# Patient Record
Sex: Female | Born: 1980
Health system: Southern US, Community
[De-identification: ages and names within clinical notes are randomized; demographics above are authoritative.]

## PROBLEM LIST (undated history)

## (undated) ENCOUNTER — Ambulatory Visit (HOSPITAL_COMMUNITY): Admission: EM | Payer: Medicaid Other | Source: Home / Self Care

## (undated) DIAGNOSIS — F419 Anxiety disorder, unspecified: Secondary | ICD-10-CM

## (undated) DIAGNOSIS — I10 Essential (primary) hypertension: Secondary | ICD-10-CM

## (undated) DIAGNOSIS — R7303 Prediabetes: Secondary | ICD-10-CM

## (undated) DIAGNOSIS — M109 Gout, unspecified: Secondary | ICD-10-CM

## (undated) DIAGNOSIS — F329 Major depressive disorder, single episode, unspecified: Secondary | ICD-10-CM

## (undated) DIAGNOSIS — Z8739 Personal history of other diseases of the musculoskeletal system and connective tissue: Secondary | ICD-10-CM

## (undated) DIAGNOSIS — F32A Depression, unspecified: Secondary | ICD-10-CM

## (undated) DIAGNOSIS — N201 Calculus of ureter: Secondary | ICD-10-CM

## (undated) DIAGNOSIS — K219 Gastro-esophageal reflux disease without esophagitis: Secondary | ICD-10-CM

## (undated) HISTORY — PX: TUBAL LIGATION: SHX77

## (undated) HISTORY — PX: CHOLECYSTECTOMY: SHX55

---

## 2000-12-31 ENCOUNTER — Encounter: Payer: Self-pay | Admitting: Emergency Medicine

## 2000-12-31 ENCOUNTER — Inpatient Hospital Stay (HOSPITAL_COMMUNITY): Admission: EM | Admit: 2000-12-31 | Discharge: 2001-01-03 | Payer: Self-pay | Admitting: Emergency Medicine

## 2015-10-23 ENCOUNTER — Inpatient Hospital Stay (HOSPITAL_COMMUNITY)
Admission: AD | Admit: 2015-10-23 | Discharge: 2015-10-23 | Disposition: A | Discharge status: Home / Self Care | Payer: Medicaid Other | Source: Ambulatory Visit | Attending: Obstetrics and Gynecology | Admitting: Obstetrics and Gynecology

## 2015-10-23 ENCOUNTER — Encounter (HOSPITAL_COMMUNITY): Payer: Self-pay | Admitting: *Deleted

## 2015-10-23 DIAGNOSIS — Y839 Surgical procedure, unspecified as the cause of abnormal reaction of the patient, or of later complication, without mention of misadventure at the time of the procedure: Secondary | ICD-10-CM | POA: Insufficient documentation

## 2015-10-23 DIAGNOSIS — T814XXA Infection following a procedure, initial encounter: Secondary | ICD-10-CM | POA: Diagnosis not present

## 2015-10-23 DIAGNOSIS — R109 Unspecified abdominal pain: Secondary | ICD-10-CM | POA: Diagnosis present

## 2015-10-23 DIAGNOSIS — IMO0001 Reserved for inherently not codable concepts without codable children: Secondary | ICD-10-CM

## 2015-10-23 HISTORY — DX: Depression, unspecified: F32.A

## 2015-10-23 HISTORY — DX: Essential (primary) hypertension: I10

## 2015-10-23 HISTORY — DX: Major depressive disorder, single episode, unspecified: F32.9

## 2015-10-23 MED ORDER — OXYCODONE-ACETAMINOPHEN 5-325 MG PO TABS: 1.0000 | ORAL_TABLET | Freq: Three times a day (TID) | ORAL | 0 refills | Status: DC | PRN

## 2015-10-23 MED ORDER — SULFAMETHOXAZOLE-TRIMETHOPRIM 800-160 MG PO TABS: 1.0000 | ORAL_TABLET | Freq: Two times a day (BID) | ORAL | 0 refills | Status: DC

## 2015-10-23 NOTE — Discharge Instructions (Signed)
Wound Infection °A wound infection happens when a type of germ (bacteria) starts growing in the wound. In some cases, this can cause the wound to break open. If cared for properly, the infected wound will heal from the inside to the outside. Wound infections need treatment. °CAUSES °An infection is caused by bacteria growing in the wound.  °SYMPTOMS  °· Increase in redness, swelling, or pain at the wound site. °· Increase in drainage at the wound site. °· Wound or bandage (dressing) starts to smell bad. °· Fever. °· Feeling tired or fatigued. °· Pus draining from the wound. °TREATMENT  °Your health care provider will prescribe antibiotic medicine. The wound infection should improve within 24 to 48 hours. Any redness around the wound should stop spreading and the wound should be less painful.  °HOME CARE INSTRUCTIONS  °· Only take over-the-counter or prescription medicines for pain, discomfort, or fever as directed by your health care provider. °· Take your antibiotics as directed. Finish them even if you start to feel better. °· Gently wash the area with mild soap and water 2 times a day, or as directed. Rinse off the soap. Pat the area dry with a clean towel. Do not rub the wound. This may cause bleeding. °· Follow your health care provider's instructions for how often you need to change the dressing. °· Apply ointment and a dressing to the wound as directed. °· If the dressing sticks, moisten it with soapy water and gently remove it. °· Change the bandage right away if it becomes wet, dirty, or develops a bad smell. °· Take showers. Do not take tub baths, swim, or do anything that may soak the wound until it is healed. °· Avoid exercises that make you sweat heavily. °· Use anti-itch medicine as directed by your health care provider. The wound may itch when it is healing. Do not pick or scratch at the wound. °· Follow up with your health care provider to get your wound rechecked as directed. °SEEK MEDICAL CARE  IF: °· You have an increase in swelling, pain, or redness around the wound. °· You have an increase in the amount of pus coming from the wound. °· There is a bad smell coming from the wound. °· More of the wound breaks open. °· You have a fever. °MAKE SURE YOU:  °· Understand these instructions. °· Will watch your condition. °· Will get help right away if you are not doing well or get worse. °  °This information is not intended to replace advice given to you by your health care provider. Make sure you discuss any questions you have with your health care provider. °  °Document Released: 11/30/2002 Document Revised: 03/08/2013 Document Reviewed: 08/21/2014 °Elsevier Interactive Patient Education ©2016 [employer] ° °

## 2015-10-23 NOTE — MAU Provider Note (Signed)
History     CSN: [id number]  Arrival date and time: 10/23/15 1337   First Provider Initiated Contact with Patient 10/23/15 1428      No chief complaint on file.  HPI   Patient is a 35 year old G5 P2 who presents with concern for a wound infection. She had surgery on July 24. She reports that she had been doing well but over the past 48 hours she has had increasing pain in the belly button where she had her operation. She reports pain with movement and rolling and she reports that she's had some serous discharge from the area. She denies fevers chills. She denies nausea vomiting. 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 Depression   . Hypertension     Past Surgical History:  Procedure Laterality Date  . CESAREAN SECTION    . CHOLECYSTECTOMY    . TUBAL LIGATION      History reviewed. No pertinent family history.  Social History  Substance Use Topics  . Smoking status: Never Smoker  . Smokeless tobacco: Never Used  . Alcohol use Yes     Comment: occasional    Allergies: No Known Allergies  Prescriptions Prior to Admission  Medication Sig Dispense Refill Last Dose  . ibuprofen (ADVIL,MOTRIN) 800 MG tablet Take 800 mg by mouth every 8 (eight) hours as needed for mild pain.   10/23/2015 at Unknown time  . losartan (COZAAR) 100 MG tablet Take 100 mg by mouth daily.   10/23/2015 at Unknown time  . sertraline (ZOLOFT) 50 MG tablet Take 50 mg by mouth daily.   10/22/2015 at Unknown time    Review of Systems  Constitutional: Negative for chills and fever.  HENT: Negative for hearing loss.   Eyes: Negative for blurred vision and double vision.  Respiratory: Negative for cough, sputum production and shortness of breath.   Cardiovascular: Negative for chest pain and  palpitations.  Gastrointestinal: Positive for abdominal pain. Negative for heartburn, nausea and vomiting.  Genitourinary: Negative for dysuria and urgency.  Musculoskeletal: Negative for myalgias and neck pain.  Skin: Negative for itching and rash.  Neurological: Negative for dizziness, tingling and headaches.  Endo/Heme/Allergies: Negative for environmental allergies. Does not bruise/bleed easily.  Psychiatric/Behavioral: Negative for depression and substance abuse.   Physical Exam   Blood pressure 127/62, pulse 77, temperature 98 F (36.7 C), resp. rate 16.  Physical Exam  Constitutional: She appears well-developed and well-nourished.  HENT:  Head: Normocephalic and atraumatic.  Cardiovascular: Normal rate and regular rhythm.   Respiratory: Effort normal and breath sounds normal.  GI: Soft. Bowel sounds are normal.      MAU Course  Procedures  MDM In the MAU patient was evaluated and found to have a likely infected wound. She reports minimal pain except with manipulation of the area. She reports no allergies. She does ask that she establish care with our OB group as she recently moved to the area. As no purulent fluid was able to be expressed culture was not collected  Assessment and Plan  Wound infection: Patient with superficial wound infection. Given healthcare job will treat with Bactrim DS twice a day for 7  days. Patient given a emergency follow-up appointment on Monday, 10/29/2015 at 140. Patient was given 5 Percocet tablets to take in the evening to help her sleep and get comfortable until her infection begins to improve.  Melissa Cooley 10/23/2015, 2:44 PM

## 2015-10-23 NOTE — MAU Note (Signed)
Pt presents to MAU with complaints of itching and burning around her belly button where she had her tubal ligation on July the 24th. Pt had surgery in [place] and has just moved here.

## 2015-10-29 ENCOUNTER — Encounter: Payer: Self-pay | Admitting: Obstetrics & Gynecology

## 2015-10-29 ENCOUNTER — Ambulatory Visit (INDEPENDENT_AMBULATORY_CARE_PROVIDER_SITE_OTHER): Payer: Self-pay | Admitting: Obstetrics & Gynecology

## 2015-10-29 VITALS — BP 136/59 | HR 86 | Ht 71.0 in | Wt 220.1 lb

## 2015-10-29 DIAGNOSIS — Z4889 Encounter for other specified surgical aftercare: Secondary | ICD-10-CM

## 2015-10-29 NOTE — Progress Notes (Signed)
   Subjective:    Patient ID: Melissa Cooley, female    DOB: June 11, 1980, 35 y.o.   MRN: [id number]  HPI 35 yo AA lady here for an incision check. She had a BTL abot 4 weeks ago in [place]. She recently located here. She has no concerns.   Review of Systems     Objective:   Physical Exam [id number] Breathing, conversing, and ambulating normally Abd- benign She had a few small sutures at her 2 incisions that I removed. There is no evidence of any infection.       Assessment & Plan:  Incision check- doing well RTC for annual (her pap was normal reportedly last year

## 2015-11-14 ENCOUNTER — Ambulatory Visit (HOSPITAL_COMMUNITY)
Admission: EM | Admit: 2015-11-14 | Discharge: 2015-11-14 | Disposition: A | Discharge status: Home / Self Care | Payer: Medicaid Other | Attending: Emergency Medicine | Admitting: Emergency Medicine

## 2015-11-14 ENCOUNTER — Encounter (HOSPITAL_COMMUNITY): Payer: Self-pay | Admitting: Emergency Medicine

## 2015-11-14 ENCOUNTER — Telehealth: Payer: Self-pay | Admitting: *Deleted

## 2015-11-14 DIAGNOSIS — F329 Major depressive disorder, single episode, unspecified: Secondary | ICD-10-CM

## 2015-11-14 DIAGNOSIS — F32A Depression, unspecified: Secondary | ICD-10-CM

## 2015-11-14 DIAGNOSIS — I10 Essential (primary) hypertension: Secondary | ICD-10-CM

## 2015-11-14 MED ORDER — SERTRALINE HCL 50 MG PO TABS: 50.0000 mg | ORAL_TABLET | Freq: Every day | ORAL | 0 refills | Status: DC

## 2015-11-14 MED ORDER — LOSARTAN POTASSIUM 100 MG PO TABS: 100.0000 mg | ORAL_TABLET | Freq: Every day | ORAL | 0 refills | Status: DC

## 2015-11-14 NOTE — ED Triage Notes (Signed)
Patient is new [place] from [place]. Patient does not have a pcp in [place].  Patient is asking for refills of zoloft and losartan.  Patient say she has been calling around for an appt locally, but no one taking new patient's

## 2015-11-14 NOTE — Telephone Encounter (Signed)
Pt left message on nurse voice mail today @ 1012 requesting refill of Losartan and Zoloft. Per chart review, pt went [facility] @ 1230 for this reason. She was given Rx for 30 Shalea Tomczak supply of the requested medications. She will need to find PCP care as this specialty Gyn office will not prescribe these medications.

## 2015-11-15 NOTE — ED Provider Notes (Signed)
CSN: [id number]     Arrival date & time 11/14/15  1129 History   First MD Initiated Contact with Patient 11/14/15 1233     Chief Complaint  Patient presents with  . Medication Refill   (Consider location/radiation/quality/duration/timing/severity/associated sxs/prior Treatment) Patient has hx of htn and depression and has no pcp and needs refills   The history is provided by the patient.  Medication Refill  Medications/supplies requested:  Zoloft and losartan Reason for request:  Clinic/provider not available Medications taken before: yes - see home medications     Past Medical History:  Diagnosis Date  . Depression   . Hypertension    Past Surgical History:  Procedure Laterality Date  . CESAREAN SECTION    . CHOLECYSTECTOMY    . TUBAL LIGATION     Family History  Problem Relation Age of Onset  . Cancer Mother   . Diabetes Father    Social History  Substance Use Topics  . Smoking status: Never Smoker  . Smokeless tobacco: Never Used  . Alcohol use Yes     Comment: occasional   OB History    Gravida Para Term Preterm AB Living   5 2     3 2    SAB TAB Ectopic Multiple Live Births     3     2     Review of Systems  Constitutional: Negative.   HENT: Negative.   Eyes: Negative.   Respiratory: Negative.   Cardiovascular: Negative.   Gastrointestinal: Negative.   Endocrine: Negative.   Genitourinary: Negative.   Musculoskeletal: Negative.   Skin: Negative.   Allergic/Immunologic: Negative.   Neurological: Negative.   Hematological: Negative.   Psychiatric/Behavioral: Negative.     Allergies  Review of patient's allergies indicates no known allergies.  Home Medications   Prior to Admission medications   Medication Sig Start Date End Date Taking? Authorizing Provider  losartan (COZAAR) 100 MG tablet Take 100 mg by mouth daily.   Yes Historical Provider, [place]  sertraline (ZOLOFT) 50 MG tablet Take 50 mg by mouth daily.   Yes Historical Provider, [place]   ibuprofen (ADVIL,MOTRIN) 800 MG tablet Take 800 mg by mouth every 8 (eight) hours as needed for mild pain.    Historical Provider, [place]  losartan (COZAAR) 100 MG tablet Take 1 tablet (100 mg total) by mouth daily. 11/14/15   Deatra CanterWilliam J Donica Derouin, FNP  oxyCODONE-acetaminophen (ROXICET) 5-325 MG tablet Take 1 tablet by mouth every 8 (eight) hours as needed for severe pain. 10/23/15   Lorne SkeensNicholas Michael Schenk, MD  sertraline (ZOLOFT) 50 MG tablet Take 1 tablet (50 mg total) by mouth daily. 11/14/15   Deatra CanterWilliam J Damany Eastman, FNP  sulfamethoxazole-trimethoprim (BACTRIM DS,SEPTRA DS) 800-160 MG tablet Take 1 tablet by mouth 2 (two) times daily. 10/23/15   Lorne SkeensNicholas Michael Schenk, MD   Meds Ordered and Administered this Visit  Medications - No data to display  BP 127/71 (BP Location: Left Arm) Comment (BP Location): large cuff  Pulse 66   Temp 98.4 F (36.9 C) (Oral)   Resp 16   LMP 09/27/2015   SpO2 100%  No data found.   Physical Exam  Constitutional: She appears well-developed and well-nourished.  HENT:  Head: Normocephalic and atraumatic.  Right Ear: External ear normal.  Left Ear: External ear normal.  Mouth/Throat: Oropharynx is clear and moist.  Eyes: Conjunctivae and EOM are normal. Pupils are equal, round, and reactive to light.  Neck: Normal range of motion. Neck supple.  Cardiovascular: Normal rate,  regular rhythm and normal heart sounds.   Pulmonary/Chest: Effort normal and breath sounds normal.  Abdominal: Soft. Bowel sounds are normal.  Nursing note and vitals reviewed.   Urgent Care Course   Clinical Course    Procedures (including critical care time)  Labs Review Labs Reviewed - No data to display  Imaging Review No results found.   Visual Acuity Review  Right Eye Distance:   Left Eye Distance:   Bilateral Distance:    Right Eye Near:   Left Eye Near:    Bilateral Near:         MDM   1. Depression   2. Essential hypertension    zoloft 50mg  one po qd  #30 Losartan 100mg  one po qd #30    Deatra Canter, FNP 11/15/15 [phone]

## 2016-01-03 ENCOUNTER — Other Ambulatory Visit: Payer: Self-pay

## 2016-01-03 ENCOUNTER — Emergency Department (HOSPITAL_COMMUNITY)
Admission: EM | Admit: 2016-01-03 | Discharge: 2016-01-03 | Disposition: A | Discharge status: Home / Self Care | Payer: Medicaid Other | Attending: Emergency Medicine | Admitting: Emergency Medicine

## 2016-01-03 ENCOUNTER — Encounter (HOSPITAL_COMMUNITY): Payer: Self-pay | Admitting: *Deleted

## 2016-01-03 ENCOUNTER — Emergency Department (HOSPITAL_COMMUNITY): Payer: Medicaid Other

## 2016-01-03 DIAGNOSIS — I10 Essential (primary) hypertension: Secondary | ICD-10-CM | POA: Diagnosis present

## 2016-01-03 DIAGNOSIS — Z79899 Other long term (current) drug therapy: Secondary | ICD-10-CM | POA: Diagnosis not present

## 2016-01-03 LAB — BASIC METABOLIC PANEL
Anion gap: 6 (ref 5–15)
BUN: 16 mg/dL (ref 6–20)
CHLORIDE: 109 mmol/L (ref 101–111)
CO2: 22 mmol/L (ref 22–32)
Calcium: 9.3 mg/dL (ref 8.9–10.3)
Creatinine, Ser: 0.65 mg/dL (ref 0.44–1.00)
GFR calc non Af Amer: >60 mL/min (ref >60)
Glucose, Bld: 92 mg/dL (ref 65–99)
POTASSIUM: 4 mmol/L (ref 3.5–5.1)
SODIUM: 137 mmol/L (ref 135–145)

## 2016-01-03 LAB — CBC
HEMATOCRIT: 40.6 % (ref 36.0–46.0)
Hemoglobin: 13.6 g/dL (ref 12.0–15.0)
MCH: 31.7 pg (ref 26.0–34.0)
MCHC: 33.5 g/dL (ref 30.0–36.0)
MCV: 94.6 fL (ref 78.0–100.0)
Platelets: 312 10*3/uL (ref 150–400)
RBC: 4.29 MIL/uL (ref 3.87–5.11)
RDW: 13.4 % (ref 11.5–15.5)
WBC: 6.6 10*3/uL (ref 4.0–10.5)

## 2016-01-03 LAB — I-STAT TROPONIN, ED: Troponin i, poc: 0 ng/mL (ref 0.00–0.08)

## 2016-01-03 MED ORDER — LOSARTAN POTASSIUM 100 MG PO TABS: 100.0000 mg | ORAL_TABLET | Freq: Every day | ORAL | 0 refills | Status: DC

## 2016-01-03 MED ORDER — LOSARTAN POTASSIUM 50 MG PO TABS
50.0000 mg | ORAL_TABLET | ORAL | Status: AC
Start: 2016-01-03 — End: 2016-01-03
  Administered 2016-01-03: 50 mg via ORAL
  Filled 2016-01-03: qty 1

## 2016-01-03 NOTE — ED Notes (Signed)
Pt not available to sign paperwork

## 2016-01-03 NOTE — ED Provider Notes (Signed)
[facility] Provider Note   CSN: [id number] Arrival date & time: 01/03/16  1702     History   Chief Complaint Chief Complaint  Patient presents with  . Chest Pain  . Hypertension  . Headache    HPI Melissa Cooley is a 35 y.o. female.  This a 35 year old female with a history of hypertension and hyperlipidemia who states she moved to [place], 2 months ago from [place].  She into an urgent care to establish primary medical care, was given a prescription for her antihypertensive and has been unable to fill this for several months.  She again went to an urgent care yesterday who sent a prescription for Cozaar to [facility] and they would not fill it because her Medicaid card is not validated [place].  She states she had to miss work yesterday and today because her feet are swollen and this is a result of her blood pressure being slightly elevated and being without medication for approximately 2 months.  Denies any shortness of breath or chest pain      Past Medical History:  Diagnosis Date  . Depression   . Hypertension     There are no active problems to display for this patient.   Past Surgical History:  Procedure Laterality Date  . CESAREAN SECTION    . CHOLECYSTECTOMY    . TUBAL LIGATION      OB History    Gravida Para Term Preterm AB Living   5 2     3 2    SAB TAB Ectopic Multiple Live Births     3     2       Home Medications    Prior to Admission medications   Medication Sig Start Date End Date Taking? Authorizing Provider  sertraline (ZOLOFT) 50 MG tablet Take 50 mg by mouth daily.   Yes Historical Provider, [place]  losartan (COZAAR) 100 MG tablet Take 1 tablet (100 mg total) by mouth daily. 01/03/16   Earley Favor, NP  oxyCODONE-acetaminophen (ROXICET) 5-325 MG tablet Take 1 tablet by mouth every 8 (eight) hours as needed for severe pain. Patient not taking: Reported on 01/03/2016 10/23/15   Lorne Skeens, MD  sertraline (ZOLOFT)  50 MG tablet Take 1 tablet (50 mg total) by mouth daily. Patient not taking: Reported on 01/03/2016 11/14/15   Deatra Canter, FNP  sulfamethoxazole-trimethoprim (BACTRIM DS,SEPTRA DS) 800-160 MG tablet Take 1 tablet by mouth 2 (two) times daily. Patient not taking: Reported on 01/03/2016 10/23/15   Lorne Skeens, MD    Family History Family History  Problem Relation Age of Onset  . Cancer Mother   . Diabetes Father     Social History Social History  Substance Use Topics  . Smoking status: Never Smoker  . Smokeless tobacco: Never Used  . Alcohol use Yes     Comment: occasional     Allergies   Review of patient's allergies indicates no known allergies.   Review of Systems Review of Systems  Eyes: Negative for visual disturbance.  All other systems reviewed and are negative.    Physical Exam Updated Vital Signs BP 147/81 (BP Location: Right Arm)   Pulse 64   Temp 98.5 F (36.9 C) (Oral)   Resp 16   LMP 09/27/2015 Comment: tubes tied  SpO2 100%   Physical Exam  Eyes: Pupils are equal, round, and reactive to light.  Neck: Normal range of motion.  Cardiovascular: Normal rate.   Pulmonary/Chest: Effort normal.  Skin: Skin is warm and dry.  Psychiatric: Her speech is rapid and/or pressured. She is agitated and aggressive.  Nursing note and vitals reviewed.    ED Treatments / Results  Labs (all labs ordered are listed, but only abnormal results are displayed) Labs Reviewed  BASIC METABOLIC PANEL  CBC  I-STAT TROPOININ, ED    EKG  EKG Interpretation  Date/Time:  Thursday January 03 2016 17:17:35 EDT Ventricular Rate:  59 PR Interval:  164 QRS Duration: 72 QT Interval:  394 QTC Calculation: 390 R Axis:   45 Text Interpretation:  Sinus bradycardia Otherwise normal ECG No old tracing to compare Confirmed by [facility]  MD, DAVID ([id number]) on 01/03/2016 5:28:38 PM       Radiology Dg Chest 2 View  Result Date: 01/03/2016 CLINICAL DATA:  Chest  pain EXAM: CHEST  2 VIEW COMPARISON:  None. FINDINGS: The heart size and mediastinal contours are within normal limits. Both lungs are clear. The visualized skeletal structures are unremarkable. IMPRESSION: No active cardiopulmonary disease. Electronically Signed   By: Marlan Palauharles  Clark M.D.   On: 01/03/2016 18:04    Procedures Procedures (including critical care time)  Medications Ordered in ED Medications  losartan (COZAAR) tablet 50 mg (50 mg Oral Given 01/03/16 2055)     Initial Impression / Assessment and Plan / ED Course  I have reviewed the triage vital signs and the nursing notes.  Pertinent labs & imaging results that were available during my care of the patient were reviewed by me and considered in my medical decision making (see chart for details).  Clinical Course     Will give 1 dose of Cozaar in department, will give physical RX and coupon for medication with advise to establish Medicaid in [place]    Final Clinical Impressions(s) / ED Diagnoses   Final diagnoses:  Hypertension, unspecified type    New Prescriptions Current Discharge Medication List       Earley FavorGail Antwaine Boomhower, NP 01/03/16 2106    Dione Boozeavid Glick, MD 01/04/16 [phone]

## 2016-01-03 NOTE — ED Notes (Signed)
Spoke with pt. Regarding discharge NP working on papers advised pt. That papers for discharge will be available soon

## 2016-01-03 NOTE — ED Notes (Signed)
Discharge papers found on opposite nurses station went in to discharge pt., pt was gone new pt placed in room

## 2016-01-03 NOTE — ED Triage Notes (Signed)
Pt c/o high blood pressure, cp, and "swimmy" head for weeks. Pt has been off her blood pressure medication for two months. Pt move here two months ago and has not set up a PCP yet.

## 2016-01-03 NOTE — Discharge Instructions (Signed)
Take your scripture and coupon to [facility].  This will cause to 1266 for a 30 day supply.  Also recommend that you straighten out your [facility] because losartan or Cozaar is definitely covered under our prescription plan.

## 2016-02-25 ENCOUNTER — Encounter: Payer: Self-pay | Admitting: Obstetrics & Gynecology

## 2016-02-25 ENCOUNTER — Other Ambulatory Visit (HOSPITAL_COMMUNITY)
Admission: RE | Admit: 2016-02-25 | Discharge: 2016-02-25 | Disposition: A | Discharge status: Home / Self Care | Payer: Medicaid Other | Source: Ambulatory Visit | Attending: Obstetrics & Gynecology | Admitting: Obstetrics & Gynecology

## 2016-02-25 ENCOUNTER — Other Ambulatory Visit: Payer: Self-pay | Admitting: Obstetrics & Gynecology

## 2016-02-25 ENCOUNTER — Ambulatory Visit (INDEPENDENT_AMBULATORY_CARE_PROVIDER_SITE_OTHER): Payer: Medicaid Other | Admitting: Obstetrics & Gynecology

## 2016-02-25 ENCOUNTER — Ambulatory Visit (INDEPENDENT_AMBULATORY_CARE_PROVIDER_SITE_OTHER): Payer: Self-pay | Admitting: Clinical

## 2016-02-25 VITALS — BP 143/88 | HR 66 | Wt 231.3 lb

## 2016-02-25 DIAGNOSIS — Z113 Encounter for screening for infections with a predominantly sexual mode of transmission: Secondary | ICD-10-CM | POA: Diagnosis present

## 2016-02-25 DIAGNOSIS — Z01419 Encounter for gynecological examination (general) (routine) without abnormal findings: Secondary | ICD-10-CM

## 2016-02-25 DIAGNOSIS — F411 Generalized anxiety disorder: Secondary | ICD-10-CM

## 2016-02-25 DIAGNOSIS — S8992XA Unspecified injury of left lower leg, initial encounter: Secondary | ICD-10-CM

## 2016-02-25 DIAGNOSIS — Z1151 Encounter for screening for human papillomavirus (HPV): Secondary | ICD-10-CM | POA: Insufficient documentation

## 2016-02-25 DIAGNOSIS — Z1231 Encounter for screening mammogram for malignant neoplasm of breast: Secondary | ICD-10-CM

## 2016-02-25 DIAGNOSIS — Z0000 Encounter for general adult medical examination without abnormal findings: Secondary | ICD-10-CM | POA: Diagnosis not present

## 2016-02-25 LAB — CBC
HCT: 42.4 % (ref 35.0–45.0)
HEMOGLOBIN: 14 g/dL (ref 11.7–15.5)
MCH: 31.4 pg (ref 27.0–33.0)
MCHC: 33 g/dL (ref 32.0–36.0)
MCV: 95.1 fL (ref 80.0–100.0)
MPV: 9.7 fL (ref 7.5–12.5)
Platelets: 349 10*3/uL (ref 140–400)
RBC: 4.46 MIL/uL (ref 3.80–5.10)
RDW: 13.7 % (ref 11.0–15.0)
WBC: 5.9 10*3/uL (ref 3.8–10.8)

## 2016-02-25 LAB — TSH: TSH: 0.92 m[IU]/L

## 2016-02-25 MED ORDER — NORGESTREL-ETHINYL ESTRADIOL 0.3-30 MG-MCG PO TABS: 1.0000 | ORAL_TABLET | Freq: Every day | ORAL | 11 refills | Status: DC

## 2016-02-25 MED ORDER — METRONIDAZOLE 500 MG PO TABS: 500.0000 mg | ORAL_TABLET | Freq: Two times a day (BID) | ORAL | 0 refills | Status: DC

## 2016-02-25 NOTE — BH Specialist Note (Signed)
Session Start time: 9:50  End Time: 10:20Total Time:  30 minutes Type of Service: Behavioral Health - Individual/Family Interpreter: No.   Interpreter Name & Language: n/a # [facility]-June 2018[facility]   SUBJECTIVE: Melissa Cooley is a 35 y.o. female  Pt. was referred by Dr. Marice Potterove for:  anxiety. Pt. reports the following symptoms/concerns: Pt states that she is being treated for depression, but not for anxiety, although she is having panic attacks and anxiety that is affecting her daily functioning; open to learning new strategy today and establishing care with psychiatry for continued treatment of symptoms. Symptoms have increased since move to [place]; financial difficulties. 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  Pt may benefit from psychoeducation and brief therapeutic interventions regarding coping with symptoms of anxiety and depression, along with community resources.    PLAN: 1. F/U with behavioral health clinician: As needed 2. Behavioral Health meds: Zoloft 3. Behavioral recommendations:  -[facility] within one week, to  establish care with psychiatry -Practice daily CALM relaxation breathing exercise -Consider calming apps for additional self-care -Read educational material regarding coping with symptoms of anxiety (and mild depressive symptoms) -Consider [facility] as additional community resource 4. Referral: Brief Counseling/Psychotherapy, Publishing rights managerCommunity Resource and Psychoeducation 5. From scale of 1-10, how likely are you to follow plan: 9   Rae LipsJamie C Karie Skowron LCSWA Behavioral Health Clinician  Warmhandoff:   Warm Hand Off Completed.         Depression screen PHQ 2/9 02/25/2016  Decreased Interest 2  Down, Depressed, Hopeless 2  PHQ - 2 Score 4  Altered sleeping 2  Tired, decreased energy 0  Change in appetite 0  Feeling bad or failure about yourself  0  Trouble concentrating 2  Moving slowly or fidgety/restless 2  Suicidal thoughts 0  PHQ-9 Score 10   GAD 7 : Generalized Anxiety Score 02/25/2016  Nervous, Anxious, on Edge 3  Control/stop worrying 3  Worry too much - different things 3  Trouble relaxing 2  Restless 3  Easily annoyed or irritable 3  Afraid - awful might happen 3  Total GAD 7 Score 20

## 2016-02-25 NOTE — Progress Notes (Signed)
Subjective:    Melissa Cooley is a 35 y.o. S AA P2 (14 and 35 yo kids) female who presents for an annual exam. The patient has no complaints today. The patient is sexually active. GYN screening history: last pap: was normal. The patient wears seatbelts: yes. The patient participates in regular exercise: no. Has the patient ever been transfused or tattooed?: yes. The patient reports that there is not domestic violence in her life.   Menstrual History: OB History    Gravida Para Term Preterm AB Living   5 2     3 2    SAB TAB Ectopic Multiple Live Births     3     2      Menarche age: 89 No LMP recorded. Patient is not currently having periods (Reason: Irregular Periods).    The following portions of the patient's history were reviewed and updated as appropriate: allergies, current medications, past family history, past medical history, past social history, past surgical history and problem list.  Review of Systems Pertinent items are noted in HPI.   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, environmentalCNA Says irregular bleeding since BTL 12/17 [place] Objective:    BP (!) 143/88   Pulse 66   Wt 231 lb 4.8 oz (104.9 kg)   BMI 32.26 kg/m   General Appearance:    Alert, cooperative, no distress, appears stated age  Head:    Normocephalic, without obvious abnormality, atraumatic  Eyes:    PERRL, conjunctiva/corneas clear, EOM's intact, fundi    benign, both eyes  Ears:    Normal TM's and external ear canals, both ears  Nose:   Nares normal, septum midline, mucosa normal, no drainage    or sinus tenderness  Throat:   Lips, mucosa, and tongue normal; teeth and gums normal  Neck:   Supple, symmetrical, trachea midline, no adenopathy;    thyroid:  no enlargement/tenderness/nodules; no carotid   bruit or JVD  Back:     Symmetric, no curvature, ROM normal, no CVA tenderness  Lungs:     Clear to  auscultation bilaterally, respirations unlabored  Chest Wall:    No tenderness or deformity   Heart:    Regular rate and rhythm, S1 and S2 normal, no murmur, rub   or gallop  Breast Exam:    No tenderness, masses, or nipple abnormality  Abdomen:     Soft, non-tender, bowel sounds active all four quadrants,    no masses, no organomegaly  Genitalia:    Normal female without lesion, discharge or tenderness, discharge c/w BV, NSSA, NT, no palpable adnexal masses     Extremities:   Extremities normal, atraumatic, no cyanosis or edema  Pulses:   2+ and symmetric all extremities  Skin:   Skin color, texture, turgor normal, no rashes or lesions  Lymph nodes:   Cervical, supraclavicular, and axillary nodes normal  Neurologic:   CNII-XII intact, normal strength, sensation and reflexes    throughout   .    Assessment:    Healthy female exam.   BV Irregular periods FH of breast and ovarian cancer   Plan:     Mammogram. Thin prep Pap smear. STI testing  per patient request Fasting labs Flagyl  HPV cotesting Start Lo ovral for menstrual control Refer to Fam Med Flu vaccine today

## 2016-02-26 LAB — LIPID PANEL
CHOLESTEROL: 114 mg/dL (ref <200)
HDL: 41 mg/dL — ABNORMAL LOW (ref >50)
LDL Cholesterol: 56 mg/dL (ref <100)
Total CHOL/HDL Ratio: 2.8 Ratio (ref <5.0)
Triglycerides: 87 mg/dL (ref <150)
VLDL: 17 mg/dL (ref <30)

## 2016-02-26 LAB — COMPREHENSIVE METABOLIC PANEL
ALBUMIN: 4 g/dL (ref 3.6–5.1)
ALT: 14 U/L (ref 6–29)
AST: 17 U/L (ref 10–30)
Alkaline Phosphatase: 45 U/L (ref 33–115)
BILIRUBIN TOTAL: 0.6 mg/dL (ref 0.2–1.2)
BUN: 9 mg/dL (ref 7–25)
CO2: 23 mmol/L (ref 20–31)
CREATININE: 0.56 mg/dL (ref 0.50–1.10)
Calcium: 9 mg/dL (ref 8.6–10.2)
Chloride: 106 mmol/L (ref 98–110)
Glucose, Bld: 90 mg/dL (ref 65–99)
Potassium: 4.7 mmol/L (ref 3.5–5.3)
SODIUM: 138 mmol/L (ref 135–146)
TOTAL PROTEIN: 6.7 g/dL (ref 6.1–8.1)

## 2016-02-26 LAB — HEPATITIS B SURFACE ANTIGEN: Hepatitis B Surface Ag: NEGATIVE

## 2016-02-26 LAB — VITAMIN D 25 HYDROXY (VIT D DEFICIENCY, FRACTURES): Vit D, 25-Hydroxy: 24 ng/mL — ABNORMAL LOW (ref 30–100)

## 2016-02-26 LAB — HEPATITIS C ANTIBODY: HCV AB: NEGATIVE

## 2016-02-26 LAB — RPR

## 2016-02-26 LAB — HIV ANTIBODY (ROUTINE TESTING W REFLEX): HIV: NONREACTIVE

## 2016-02-27 LAB — CYTOLOGY - PAP
ADEQUACY: ABSENT
CHLAMYDIA, DNA PROBE: NEGATIVE
Diagnosis: NEGATIVE
HPV: DETECTED — AB
NEISSERIA GONORRHEA: NEGATIVE

## 2016-02-28 ENCOUNTER — Telehealth: Payer: Self-pay

## 2016-02-28 MED ORDER — VITAMIN D (ERGOCALCIFEROL) 1.25 MG (50000 UNIT) PO CAPS: 50000.0000 [IU] | ORAL_CAPSULE | ORAL | 0 refills | Status: DC

## 2016-02-28 NOTE — Telephone Encounter (Signed)
Per Dr. Marice Potterove, pt needs 8 weeks of weekly Vit D 50,000 units then recheck level.  LM for pt to return call in regards to medication management and lab appt scheduled for follow up Vit D level.

## 2016-02-29 NOTE — Telephone Encounter (Signed)
Carrera left another message she missed us calling her several times yesterday. I called her back and notified her of her vitamin D level was low and Dr. Marice Potterove ordered  Vitamin D weekly for 8 weeks and then call to get appointment to get Vitamin D level rechecked. Patient voices understanding.

## 2016-03-03 ENCOUNTER — Telehealth: Payer: Self-pay | Admitting: *Deleted

## 2016-03-03 NOTE — Telephone Encounter (Signed)
Called pt and advised her of negative Pap but with +HR HPV.  She will need Pap in 1 year with co-testing.  Pt voiced understanding.

## 2016-03-19 ENCOUNTER — Ambulatory Visit
Admission: RE | Admit: 2016-03-19 | Discharge: 2016-03-19 | Disposition: A | Discharge status: Home / Self Care | Payer: Medicaid Other | Source: Ambulatory Visit | Attending: Obstetrics & Gynecology | Admitting: Obstetrics & Gynecology

## 2016-03-19 DIAGNOSIS — Z1231 Encounter for screening mammogram for malignant neoplasm of breast: Secondary | ICD-10-CM

## 2016-04-11 ENCOUNTER — Ambulatory Visit: Payer: Self-pay | Admitting: Family Medicine

## 2016-04-24 ENCOUNTER — Ambulatory Visit (HOSPITAL_COMMUNITY)
Admission: EM | Admit: 2016-04-24 | Discharge: 2016-04-24 | Disposition: A | Discharge status: Home / Self Care | Payer: Medicaid Other | Attending: Emergency Medicine | Admitting: Emergency Medicine

## 2016-04-24 ENCOUNTER — Encounter (HOSPITAL_COMMUNITY): Payer: Self-pay | Admitting: Emergency Medicine

## 2016-04-24 DIAGNOSIS — S81812A Laceration without foreign body, left lower leg, initial encounter: Secondary | ICD-10-CM
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
## 2016-08-12 ENCOUNTER — Encounter (HOSPITAL_COMMUNITY): Payer: Self-pay

## 2016-08-12 ENCOUNTER — Emergency Department (HOSPITAL_COMMUNITY): Payer: Medicaid Other

## 2016-08-12 ENCOUNTER — Emergency Department (HOSPITAL_COMMUNITY)
Admission: EM | Admit: 2016-08-12 | Discharge: 2016-08-12 | Disposition: A | Discharge status: Home / Self Care | Payer: Medicaid Other | Attending: Emergency Medicine | Admitting: Emergency Medicine

## 2016-08-12 DIAGNOSIS — I10 Essential (primary) hypertension: Secondary | ICD-10-CM | POA: Insufficient documentation

## 2016-08-12 DIAGNOSIS — F17200 Nicotine dependence, unspecified, uncomplicated: Secondary | ICD-10-CM | POA: Diagnosis not present

## 2016-08-12 DIAGNOSIS — Z79899 Other long term (current) drug therapy: Secondary | ICD-10-CM | POA: Diagnosis not present

## 2016-08-12 DIAGNOSIS — M79641 Pain in right hand: Secondary | ICD-10-CM | POA: Diagnosis not present

## 2016-08-12 HISTORY — DX: Gout, unspecified: M10.9

## 2016-08-12 MED ORDER — IBUPROFEN 600 MG PO TABS: 600.0000 mg | ORAL_TABLET | Freq: Four times a day (QID) | ORAL | 0 refills | Status: DC | PRN

## 2016-08-12 MED ORDER — KETOROLAC TROMETHAMINE 60 MG/2ML IM SOLN
60.0000 mg | Freq: Once | INTRAMUSCULAR | Status: AC
  Administered 2016-08-12: 60 mg via INTRAMUSCULAR
  Filled 2016-08-12: qty 2

## 2016-08-12 NOTE — Discharge Instructions (Signed)
You have been seen today for hand and arm pain. There were no acute abnormalities on the x-rays, including no sign of fracture or dislocation. Pain: Take 500 mg of naproxen every 12 hours or 600 mg of ibuprofen every 6 hours for the next 3 days. Take these medications with food to avoid upset stomach. Choose only one of these medications, do not take them together.  Ice: May apply ice to the area over the next 24 hours for 15 minutes at a time to reduce swelling. Elevation: Keep the extremity elevated as often as possible to reduce pain and inflammation. Splint: Wear the wrist and hand brace for support and comfort. Wear this until pain resolves. Exercises: Start by performing these exercises a few times a week, increasing the frequency until you are performing them twice daily.  Follow up: Follow up with your primary care provider for any continued management.

## 2016-08-12 NOTE — Progress Notes (Signed)
Orthopedic Tech Progress Note Patient Details:  Melissa Cooley 14-Dec-1980 [id number]  Ortho Devices Type of Ortho Device: Thumb velcro splint Ortho Device/Splint Interventions: Application   Saul FordyceJennifer C Faolan [place] 08/12/2016, 10:34 AM

## 2016-08-12 NOTE — ED Provider Notes (Signed)
[facility]-EMERGENCY DEPT Provider Note   CSN: [id number] Arrival date & time: 08/12/16  0809     History   Chief Complaint Chief Complaint  Patient presents with  . Hand Pain    HPI Melissa Cooley is a 36 y.o. female.  HPI   Melissa Cooley is a 36 y.o. female, with a history of gout and HTN, presenting to the ED with right hand pain beginning this morning. Also endorses acute on chronic right shoulder and right elbow pain worse for the past two weeks. Pain in the hand is aching, at the base of the thumb, worse with palpation and movement. 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 She has not tried any therapies for this complaint. She has a PCP appointment on May 31. Denies neuro deficits, trauma, fever/chills, or any other complaints.      Past Medical History:  Diagnosis Date  . Depression   . Gout   . Hypertension     There are no active problems to display for this patient.   Past Surgical History:  Procedure Laterality Date  . CESAREAN SECTION    . CESAREAN SECTION    . CHOLECYSTECTOMY    . TUBAL LIGATION      OB History    Gravida Para Term Preterm AB Living   5 2     3 2    SAB TAB Ectopic Multiple Live Births     3     2       Home Medications    Prior to Admission medications   Medication Sig Start Date End Date Taking? Authorizing Provider  Cholecalciferol (VITAMIN D3) 1000 units CAPS Take 1,000 Units by mouth daily.   Yes [provider]  sertraline (ZOLOFT) 50 MG tablet Take 50 mg by mouth daily.   Yes [provider]  ibuprofen (ADVIL,MOTRIN) 600 MG tablet Take 1 tablet (600 mg total) by mouth every 6 (six) hours as needed. 08/12/16   Anthem Frazer C, PA-C  losartan (COZAAR) 100 MG tablet Take 1 tablet (100 mg total) by mouth daily. 01/03/16   Earley Favor, NP  metroNIDAZOLE (FLAGYL) 500 MG tablet Take 1 tablet (500 mg total) by mouth 2 (two) times daily. 02/25/16   Allie Bossier, MD    norgestrel-ethinyl estradiol (LO/OVRAL,CRYSELLE) 0.3-30 MG-MCG tablet Take 1 tablet by mouth daily. 02/25/16   Allie Bossier, MD  oxyCODONE-acetaminophen (ROXICET) 5-325 MG tablet Take 1 tablet by mouth every 8 (eight) hours as needed for severe pain. Patient not taking: Reported on 01/03/2016 10/23/15   Lorne Skeens, MD  sertraline (ZOLOFT) 50 MG tablet Take 1 tablet (50 mg total) by mouth daily. 11/14/15   Deatra Canter, FNP  sulfamethoxazole-trimethoprim (BACTRIM DS,SEPTRA DS) 800-160 MG tablet Take 1 tablet by mouth 2 (two) times daily. Patient not taking: Reported on 01/03/2016 10/23/15   Lorne Skeens, MD  Vitamin D, Ergocalciferol, (DRISDOL) 50000 units CAPS capsule Take 1 capsule (50,000 Units total) by mouth every 7 (seven) days. 02/28/16   Allie Bossier, MD    Family History Family History  Problem Relation Age of Onset  . Cancer Mother   . Diabetes Father     Social History Social History  Substance Use Topics  . Smoking status: Current Some Day Smoker  . Smokeless tobacco: Never Used  . Alcohol use Yes     Comment: occasional     Allergies  Patient has no known allergies.   Review of Systems Review of Systems  Constitutional: Negative for fever.  Respiratory: Negative for shortness of breath.   Cardiovascular: Negative for chest pain.  Musculoskeletal: Positive for arthralgias.  Neurological: Negative for weakness and numbness.     Physical Exam Updated Vital Signs BP (!) 143/94 (BP Location: Left Arm)   Pulse 71   Temp 98.2 F (36.8 C) (Oral)   Resp 18   Ht 5\' 11"  (1.803 m)   SpO2 100%   Physical Exam  Constitutional: She appears well-developed and well-nourished. No distress.  HENT:  Head: Normocephalic and atraumatic.  Eyes: Conjunctivae are normal.  Neck: Neck supple.  Cardiovascular: Normal rate, regular rhythm and intact distal pulses.   Pulmonary/Chest: Effort normal.  Musculoskeletal: Normal range of motion.   Patient has full range of motion in the right shoulder, elbow, and wrist without pain, tenderness, swelling, crepitus, erythema, increased warmth, or deformity. Tenderness over the right thenar eminence with some minor swelling. Range of motion of the right thumb is intact, although painful. No erythema or increased warmth noted.  Neurological: She is alert.  No sensory deficits noted in the upper extremities bilaterally. Strength 5 out of 5 in the upper extremities.  Skin: Skin is warm and dry. Capillary refill takes less than 2 seconds. She is not diaphoretic.  Psychiatric: She has a normal mood and affect. Her behavior is normal.  Nursing note and vitals reviewed.    ED Treatments / Results  Labs (all labs ordered are listed, but only abnormal results are displayed) Labs Reviewed - No data to display  EKG  EKG Interpretation None       Radiology Dg Hand Complete Right  Result Date: 08/12/2016 CLINICAL DATA:  Pain in the right first metacarpal and MCP joint began yesterday with no known injury. Episode of right elbow pain 2 weeks ago. EXAM: RIGHT HAND - COMPLETE 3+ VIEW COMPARISON:  None in PACs FINDINGS: The bones are subjectively adequately mineralized. There is no lytic or blastic lesion. There is no acute or healing fracture. The joint spaces are well maintained. The overlying soft tissues are normal. Specific attention to the first ray reveals no acute bony abnormality. IMPRESSION: No acute abnormality of the right thumb or elsewhere in the right hand is observed. Electronically Signed   By: David  [place] M.D.   On: 08/12/2016 10:48    Procedures .Splint Application Date/Time: 08/12/2016 10:17 AM Performed by: Anselm Pancoast Authorized by: Anselm Pancoast   Consent:    Consent obtained:  Verbal   Consent given by:  Patient Pre-procedure details:    Sensation:  Normal   Skin color:  Normal Procedure details:    Laterality:  Right   Location:  Hand   Hand:  R hand    Splint type:  Thumb spica   Supplies:  Prefabricated splint Post-procedure details:    Pain:  Improved   Sensation:  Normal   Skin color:  Normal   Patient tolerance of procedure:  Tolerated well, no immediate complications Comments:     Procedure was performed by the Ortho Tech with my evaluation before and after. I was available for consultation throughout the procedure.   (including critical care time)  Medications Ordered in ED Medications  ketorolac (TORADOL) injection 60 mg (60 mg Intramuscular Given 08/12/16 1021)     Initial Impression / Assessment and Plan / ED Course  I have reviewed the triage vital signs and the nursing notes.  Pertinent labs & imaging results that were available during my care of the patient were reviewed by me and considered in my medical decision making (see chart for details).       Patient presents with pain in the right hand. 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 No red flag symptoms. Doubt septic joint. Patient has close PCP follow up already scheduled. The patient was given instructions for home care as well as return precautions. Patient voices understanding of these instructions, accepts the plan, and is comfortable with discharge.     Vitals:   08/12/16 0814 08/12/16 1018 08/12/16 1020 08/12/16 1055  BP: (!) 143/94 (!) 141/76  (!) 141/96  Pulse: 71  (!) 58 62  Resp: 18   18  Temp: 98.2 F (36.8 C)     TempSrc: Oral     SpO2: 100%  100% 99%  Height: 5\' 11"  (1.803 m)        Final Clinical Impressions(s) / ED Diagnoses   Final diagnoses:  Right hand pain    New Prescriptions New Prescriptions   IBUPROFEN (ADVIL,MOTRIN) 600 MG TABLET    Take 1 tablet (600 mg total) by mouth every 6 (six) hours as needed.     Anselm PancoastJoy, Chenita Ruda C, PA-C 08/12/16 1121    Geoffery Lyonselo, Douglas, MD 08/13/16 [id number]

## 2016-08-12 NOTE — ED Triage Notes (Signed)
Per Pt, Pt reports having pain in there palm of her right hand that started yesterday. Pt has a hx of gout. Reports that the pain is in her right hand and now radiating through to her elbow.

## 2016-09-09 ENCOUNTER — Ambulatory Visit: Payer: Self-pay | Admitting: Obstetrics & Gynecology

## 2016-09-30 ENCOUNTER — Ambulatory Visit (INDEPENDENT_AMBULATORY_CARE_PROVIDER_SITE_OTHER): Payer: Self-pay | Admitting: Family Medicine

## 2016-09-30 ENCOUNTER — Other Ambulatory Visit (HOSPITAL_COMMUNITY)
Admission: RE | Admit: 2016-09-30 | Discharge: 2016-09-30 | Disposition: A | Discharge status: Home / Self Care | Payer: Medicaid Other | Source: Ambulatory Visit | Attending: Obstetrics & Gynecology | Admitting: Obstetrics & Gynecology

## 2016-09-30 ENCOUNTER — Encounter: Payer: Self-pay | Admitting: Obstetrics & Gynecology

## 2016-09-30 VITALS — BP 144/89 | HR 86 | Ht 71.0 in | Wt 238.7 lb

## 2016-09-30 DIAGNOSIS — Z113 Encounter for screening for infections with a predominantly sexual mode of transmission: Secondary | ICD-10-CM

## 2016-09-30 DIAGNOSIS — N898 Other specified noninflammatory disorders of vagina: Secondary | ICD-10-CM

## 2016-09-30 DIAGNOSIS — Z7251 High risk heterosexual behavior: Secondary | ICD-10-CM

## 2016-09-30 LAB — POCT URINALYSIS DIP (DEVICE)
Bilirubin Urine: NEGATIVE
GLUCOSE, UA: NEGATIVE mg/dL
KETONES UR: NEGATIVE mg/dL
Nitrite: NEGATIVE
PROTEIN: 30 mg/dL — AB
UROBILINOGEN UA: 1 mg/dL (ref 0.0–1.0)
pH: 6.5 (ref 5.0–8.0)

## 2016-09-30 NOTE — Progress Notes (Signed)
   Subjective:    Patient ID: Melissa Cooley, female    DOB: 10/23/1980, 36 y.o.   MRN: [id number]  HPI Seen for vaginal discharge x3 weeks, vaginal discomfort. Had unprotected sex in June and concerned about STD exposure.    Review of Systems     Objective:   Physical Exam  Constitutional: She appears well-developed and well-nourished.  Abdominal: Soft. She exhibits no distension. There is no tenderness. There is no rebound and no guarding.  Genitourinary: There is no rash, tenderness, lesion or injury on the right labia. There is no rash, tenderness, lesion or injury on the left labia. No erythema, tenderness or bleeding in the vagina. No foreign body in the vagina. No signs of injury around the vagina. Vaginal discharge (thin, white) found.      Assessment & Plan:  1. Unprotected sexual intercourse Testing as below. Discussed repeat HIV and HepB testing in 6 months. Will be due for repeat PAP at that time. - Cervicovaginal ancillary only - HIV antibody (with reflex) - RPR - Hepatitis B Surface AntiGEN

## 2016-09-30 NOTE — Addendum Note (Signed)
Addended by: Sherre LainASH, Amariah Kierstead A on: 09/30/2016 09:01 AM   Modules accepted: Orders

## 2016-10-01 LAB — CERVICOVAGINAL ANCILLARY ONLY
BACTERIAL VAGINITIS: NEGATIVE
Candida vaginitis: NEGATIVE
Chlamydia: NEGATIVE
Neisseria Gonorrhea: NEGATIVE
Trichomonas: POSITIVE — AB

## 2016-10-01 LAB — HEPATITIS B SURFACE ANTIGEN: HEP B S AG: NEGATIVE

## 2016-10-01 LAB — HIV ANTIBODY (ROUTINE TESTING W REFLEX): HIV Screen 4th Generation wRfx: NONREACTIVE

## 2016-10-01 LAB — RPR: RPR: NONREACTIVE

## 2016-10-03 LAB — URINE CULTURE

## 2016-10-07 ENCOUNTER — Telehealth: Payer: Self-pay

## 2016-10-07 DIAGNOSIS — A599 Trichomoniasis, unspecified: Secondary | ICD-10-CM

## 2016-10-07 MED ORDER — METRONIDAZOLE 500 MG PO TABS: 2000.0000 mg | ORAL_TABLET | Freq: Once | ORAL | 0 refills | Status: AC

## 2016-10-07 NOTE — Telephone Encounter (Signed)
Patient called the office for her test results. I called patient but got voicemail. Left message for patient to call office.

## 2016-10-07 NOTE — Telephone Encounter (Signed)
Patient returned call about results. I explained to patient that she was positive for trichomoniasis and that I would send medication to her pharmacy. I advised patient to have her partner treated as well. Patient had no questions and verbalized understanding. Flagyl was sent to pharmacy.

## 2016-12-24 ENCOUNTER — Encounter (HOSPITAL_COMMUNITY): Payer: Self-pay | Admitting: Emergency Medicine

## 2016-12-24 DIAGNOSIS — I10 Essential (primary) hypertension: Secondary | ICD-10-CM | POA: Insufficient documentation

## 2016-12-24 DIAGNOSIS — Z5321 Procedure and treatment not carried out due to patient leaving prior to being seen by health care provider: Secondary | ICD-10-CM | POA: Insufficient documentation

## 2016-12-24 NOTE — ED Triage Notes (Signed)
Pt states she hs hypertension, anxiety, and depression and has lost her medicaid and so she has not had any of her medication for the past 3 months  Pt states she has a headache, nausea, dizziness, and her right arm feels funny

## 2016-12-25 ENCOUNTER — Emergency Department (HOSPITAL_COMMUNITY)
Admission: EM | Admit: 2016-12-25 | Discharge: 2016-12-25 | Disposition: A | Discharge status: Home / Self Care | Payer: Self-pay | Attending: Emergency Medicine | Admitting: Emergency Medicine

## 2016-12-25 HISTORY — DX: Anxiety disorder, unspecified: F41.9

## 2016-12-25 NOTE — ED Notes (Signed)
Called  No response from lobby 

## 2016-12-25 NOTE — ED Notes (Signed)
I called patient for a room and no one answered 

## 2016-12-25 NOTE — ED Notes (Signed)
Pt called  No response from lobby  

## 2017-01-07 ENCOUNTER — Encounter: Payer: Self-pay | Admitting: Physician Assistant

## 2017-01-07 ENCOUNTER — Ambulatory Visit: Payer: Self-pay | Attending: Internal Medicine | Admitting: Physician Assistant

## 2017-01-07 VITALS — BP 130/88 | HR 76 | Temp 98.8°F | Resp 16 | Wt 240.8 lb

## 2017-01-07 DIAGNOSIS — F418 Other specified anxiety disorders: Secondary | ICD-10-CM

## 2017-01-07 DIAGNOSIS — Z79899 Other long term (current) drug therapy: Secondary | ICD-10-CM | POA: Insufficient documentation

## 2017-01-07 DIAGNOSIS — F419 Anxiety disorder, unspecified: Secondary | ICD-10-CM | POA: Insufficient documentation

## 2017-01-07 DIAGNOSIS — M109 Gout, unspecified: Secondary | ICD-10-CM | POA: Insufficient documentation

## 2017-01-07 DIAGNOSIS — I10 Essential (primary) hypertension: Secondary | ICD-10-CM | POA: Insufficient documentation

## 2017-01-07 DIAGNOSIS — Z131 Encounter for screening for diabetes mellitus: Secondary | ICD-10-CM | POA: Insufficient documentation

## 2017-01-07 DIAGNOSIS — F329 Major depressive disorder, single episode, unspecified: Secondary | ICD-10-CM | POA: Insufficient documentation

## 2017-01-07 LAB — GLUCOSE, POCT (MANUAL RESULT ENTRY): POC Glucose: 149 mg/dl — AB (ref 70–99)

## 2017-01-07 MED ORDER — FLUOXETINE HCL 20 MG PO TABS: 20.0000 mg | ORAL_TABLET | Freq: Every day | ORAL | 3 refills | Status: DC

## 2017-01-07 MED ORDER — LOSARTAN POTASSIUM 50 MG PO TABS: 50.0000 mg | ORAL_TABLET | Freq: Every day | ORAL | 3 refills | Status: DC

## 2017-01-07 MED FILL — LOSARTAN POTASSIUM 50 MG TA: 50 | 30 days supply | Qty: 30 | Fill #0

## 2017-01-07 MED FILL — ?FLUOXETINE HCL 20MG TABLET: 20 | 30 days supply | Qty: 30 | Fill #0

## 2017-01-07 NOTE — Progress Notes (Signed)
Melissa Cooley  YNW:[id number]  MVH:[id number]  DOB - [birth year]  Chief Complaint  Patient presents with  . Follow-up    ED       Subjective:   Melissa Cooley is a 36 y.o. female here today for establishment of care. She has a past medical history of depression mixed with anxiety, gout and hypertension. She moved here from [place] one year ago and has not been seen by primary care provider lately. She was last on antihypertensives and antidepressants 1 year ago. However, about 3 or 4 weeks ago she did start taking her mom's Prozac and she actually likes how it makes her feel. She is less anxious. She's not having a depressed mood. She is active during the day. Her appetite is okay. She is wanting to be seen by [facility]. Her mom is bipolar and in denial per pt.  In regards to her blood pressure she previously was on Cozaar 100 mg daily at one point. She denies chest pain. Sometimes she feels some palpitations. Sometimes she is having headaches. Intermittently she is having her blood pressure checked throughout the day and getting numbers in the 160s. She went to the emergency room a couple of weeks ago for elevated blood pressure but could not stay given the long wait. She is treating herself herbally with Tumeric, apple cider vinegar and drinking lots of water.  She also is wanting to be screened for diabetes. She states that she urinates a lot but drinks 10-12 cups of water per day.   She is wondering refills on her chronic medications.  ROS: GEN: denies fever or chills, denies change in weight Skin: denies lesions or rashes HEENT: denies headache, earache, epistaxis, sore throat, or neck pain LUNGS: denies SHOB, dyspnea, PND, orthopnea CV: denies CP or palpitations ABD: denies abd pain, N or V EXT: denies muscle spasms or swelling; no pain in lower ext, no weakness NEURO: denies numbness or tingling, denies sz, stroke or TIA  ALLERGIES: No Known Allergies  PAST MEDICAL  HISTORY: Past Medical History:  Diagnosis Date  . Anxiety   . Depression   . Gout   . Hypertension     PAST SURGICAL HISTORY: Past Surgical History:  Procedure Laterality Date  . CESAREAN SECTION    . CESAREAN SECTION    . CHOLECYSTECTOMY    . TUBAL LIGATION      MEDICATIONS AT HOME: Prior to Admission medications   Medication Sig Start Date End Date Taking? Authorizing Provider  Cholecalciferol (VITAMIN D3) 1000 units CAPS Take 1,000 Units by mouth daily.    [provider]  FLUoxetine (PROZAC) 20 MG tablet Take 1 tablet (20 mg total) by mouth daily. 01/07/17   Vivianne Master, PA-C  ibuprofen (ADVIL,MOTRIN) 600 MG tablet Take 1 tablet (600 mg total) by mouth every 6 (six) hours as needed. Patient not taking: Reported on 09/30/2016 08/12/16   Harolyn Rutherford C, PA-C  losartan (COZAAR) 50 MG tablet Take 1 tablet (50 mg total) by mouth daily. 01/07/17   Vivianne Master, PA-C  norgestrel-ethinyl estradiol (LO/OVRAL,CRYSELLE) 0.3-30 MG-MCG tablet Take 1 tablet by mouth daily. Patient not taking: Reported on 09/30/2016 02/25/16   Allie Bossier, MD  oxyCODONE-acetaminophen (ROXICET) 5-325 MG tablet Take 1 tablet by mouth every 8 (eight) hours as needed for severe pain. Patient not taking: Reported on 01/03/2016 10/23/15   Lorne Skeens, MD  sulfamethoxazole-trimethoprim (BACTRIM DS,SEPTRA DS) 800-160 MG tablet Take 1 tablet by mouth 2 (two) times daily.  Patient not taking: Reported on 01/03/2016 10/23/15   Lorne SkeensSchenk, Nicholas Michael, MD  Vitamin D, Ergocalciferol, (DRISDOL) 50000 units CAPS capsule Take 1 capsule (50,000 Units total) by mouth every 7 (seven) days. Patient not taking: Reported on 09/30/2016 02/28/16   Allie Bossierove, Myra C, MD    Family History  Problem Relation Age of Onset  . Cancer Mother   . Diabetes Father   . Hypertension Other    Social-unmarried, 2 kids, CNA  Objective:   Vitals:   01/07/17 1008  BP: 130/88  Pulse: 76  Resp: 16  Temp: 98.8 F (37.1 C)   TempSrc: Oral  SpO2: 100%  Weight: 240 lb 12.8 oz (109.2 kg)    Exam General appearance : Awake, alert, not in any distress. Speech Clear. Not toxic looking HEENT: Atraumatic and Normocephalic, pupils equally reactive to light and accomodation Neck: supple, no JVD. No cervical lymphadenopathy.  Chest:Good air entry bilaterally, no added sounds  CVS: S1 S2 regular, no murmurs.  Extremities: B/L Lower Ext shows no edema, both legs are warm to touch Neurology: Awake alert, and oriented X 3, CN II-XII intact, Non focal    Assessment & Plan  1. HTN  -restart ARB but at lower dose for now  -low salt diet  2. Depression m/w anxiety  -Prozac 20 mg daily  -Behavioral health referral  3. Screening for DM  -CBG 149 after breakfast  -encouraged exercise and low carb diet  -further testing at follow up if needed    Return in about 4 weeks (around 02/04/2017).  The patient was given clear instructions to go to ER or return to medical center if symptoms don't improve, worsen or new problems develop. The patient verbalized understanding. The patient was told to call to get lab results if they haven't heard anything in the next week.   Total time spent with patient was 31 min. Greater than 50 % of this visit was spent face to face counseling and coordinating care regarding risk factor modification, compliance importance and encouragement, education related to routine health maintenance.  This note has been created with Education officer, environmentalDragon speech recognition software and smart phrase technology. Any transcriptional errors are unintentional.    Scot Juniffany Maribeth Jiles, PA-C [facility] [place], [place] [phone]   01/07/2017, 10:38 AM

## 2017-01-07 NOTE — Patient Instructions (Signed)
You are likely not diabetic based on the screening today Will readdress at the next visit and will check thyroid function too HAPPY BDAY!

## 2017-01-07 NOTE — Progress Notes (Signed)
Pt states her pain level today in the office is a 10 Pt states she has a headache  Pt states she has not had her bp medicine or zoloft in a year

## 2017-01-19 ENCOUNTER — Ambulatory Visit: Payer: Medicaid Other | Attending: Family Medicine

## 2017-01-26 ENCOUNTER — Ambulatory Visit: Payer: Medicaid Other | Admitting: *Deleted

## 2017-01-26 DIAGNOSIS — R399 Unspecified symptoms and signs involving the genitourinary system: Secondary | ICD-10-CM

## 2017-01-26 LAB — POCT URINALYSIS DIP (DEVICE)
BILIRUBIN URINE: NEGATIVE
Glucose, UA: NEGATIVE mg/dL
LEUKOCYTES UA: NEGATIVE
NITRITE: NEGATIVE
PH: 7 (ref 5.0–8.0)
Protein, ur: NEGATIVE mg/dL
Specific Gravity, Urine: 1.025 (ref 1.005–1.030)
Urobilinogen, UA: 1 mg/dL (ref 0.0–1.0)

## 2017-01-26 MED ORDER — SULFAMETHOXAZOLE-TRIMETHOPRIM 800-160 MG PO TABS: 1.0000 | ORAL_TABLET | Freq: Two times a day (BID) | ORAL | 0 refills | Status: AC

## 2017-01-26 NOTE — Progress Notes (Signed)
Patient presents to clinic for uti symptoms. Reports strong odor, cloudiness, and urgency.

## 2017-01-28 LAB — URINE CULTURE: ORGANISM ID, BACTERIA: NO GROWTH

## 2017-01-30 MED FILL — ?FLUOXETINE HCL 20MG TABLET: 20 | 30 days supply | Qty: 30 | Fill #1

## 2017-02-04 MED FILL — LOSARTAN POTASSIUM 50 MG TA: 50 | 30 days supply | Qty: 30 | Fill #1

## 2017-02-20 ENCOUNTER — Ambulatory Visit: Payer: Self-pay | Attending: Family Medicine | Admitting: Family Medicine

## 2017-02-20 ENCOUNTER — Encounter: Payer: Self-pay | Admitting: Family Medicine

## 2017-02-20 ENCOUNTER — Ambulatory Visit: Payer: Self-pay | Attending: Family Medicine | Admitting: Licensed Clinical Social Worker

## 2017-02-20 VITALS — BP 130/84 | HR 66 | Temp 98.1°F | Ht 71.0 in | Wt 238.6 lb

## 2017-02-20 DIAGNOSIS — Z9049 Acquired absence of other specified parts of digestive tract: Secondary | ICD-10-CM | POA: Insufficient documentation

## 2017-02-20 DIAGNOSIS — Z131 Encounter for screening for diabetes mellitus: Secondary | ICD-10-CM

## 2017-02-20 DIAGNOSIS — K219 Gastro-esophageal reflux disease without esophagitis: Secondary | ICD-10-CM | POA: Insufficient documentation

## 2017-02-20 DIAGNOSIS — F314 Bipolar disorder, current episode depressed, severe, without psychotic features: Secondary | ICD-10-CM

## 2017-02-20 DIAGNOSIS — I10 Essential (primary) hypertension: Secondary | ICD-10-CM | POA: Insufficient documentation

## 2017-02-20 DIAGNOSIS — F319 Bipolar disorder, unspecified: Secondary | ICD-10-CM | POA: Insufficient documentation

## 2017-02-20 DIAGNOSIS — T148XXA Other injury of unspecified body region, initial encounter: Secondary | ICD-10-CM

## 2017-02-20 DIAGNOSIS — K580 Irritable bowel syndrome with diarrhea: Secondary | ICD-10-CM | POA: Insufficient documentation

## 2017-02-20 DIAGNOSIS — F419 Anxiety disorder, unspecified: Secondary | ICD-10-CM | POA: Insufficient documentation

## 2017-02-20 DIAGNOSIS — Z79899 Other long term (current) drug therapy: Secondary | ICD-10-CM | POA: Insufficient documentation

## 2017-02-20 DIAGNOSIS — K589 Irritable bowel syndrome without diarrhea: Secondary | ICD-10-CM | POA: Insufficient documentation

## 2017-02-20 DIAGNOSIS — Z9851 Tubal ligation status: Secondary | ICD-10-CM | POA: Insufficient documentation

## 2017-02-20 MED ORDER — PANTOPRAZOLE SODIUM 40 MG PO TBEC: 40.0000 mg | DELAYED_RELEASE_TABLET | Freq: Every day | ORAL | 3 refills | Status: DC

## 2017-02-20 MED ORDER — LOSARTAN POTASSIUM 50 MG PO TABS: 100.0000 mg | ORAL_TABLET | Freq: Every day | ORAL | 3 refills | Status: DC

## 2017-02-20 MED ORDER — LOSARTAN POTASSIUM 100 MG PO TABS: 100.0000 mg | ORAL_TABLET | Freq: Every day | ORAL | 3 refills | Status: DC

## 2017-02-20 MED ORDER — DICYCLOMINE HCL 10 MG PO CAPS: 10.0000 mg | ORAL_CAPSULE | Freq: Three times a day (TID) | ORAL | 3 refills | Status: DC

## 2017-02-20 MED ORDER — DIVALPROEX SODIUM 500 MG PO DR TAB: 500.0000 mg | DELAYED_RELEASE_TABLET | Freq: Two times a day (BID) | ORAL | 1 refills | Status: DC

## 2017-02-20 MED FILL — ?PANTOPRAZOLE SOD DR 40MG: 40 MG | 30 days supply | Qty: 30 | Fill #0

## 2017-02-20 MED FILL — LOSARTAN POTASSIUM 50 MG TA: 50 | 30 days supply | Qty: 60 | Fill #0

## 2017-02-20 MED FILL — DIVALPROEX SOD DR 500 MG TA: 500 | 30 days supply | Qty: 60 | Fill #0

## 2017-02-20 MED FILL — DICYCLOMINE 10 MG CAPSULE: 10 | 30 days supply | Qty: 120 | Fill #0

## 2017-02-20 NOTE — Patient Instructions (Signed)

## 2017-02-20 NOTE — BH Specialist Note (Signed)
Integrated Behavioral Health Initial Visit  MRN: [id number] Name: Melissa Cooley  Number of Integrated Behavioral Health Clinician visits:: 1/6 Session Start time: 2:08 PM  Session End time: 2:38 PM Total time: 30 minutes  Type of Service: Integrated Behavioral Health- Individual/Family Interpretor:No. Interpretor Name and Language: N.A   Warm Hand Off Completed.       SUBJECTIVE: Melissa Cooley is a 36 y.o. female accompanied by self Patient was referred by Dr. Venetia NightAmao for depression and anxiety. Patient reports the following symptoms/concerns: feelings of sadness and worry, difficulty sleeping, low energy, racing thoughts, decreased concentration, irritability, and substance use Duration of problem: Ongoing; Severity of problem: severe  OBJECTIVE: Mood: Anxious and Affect: Appropriate Risk of harm to self or others: No plan to harm self or others  LIFE CONTEXT: Family and Social: Pt has two minor children that she raises on her own. She receives emotional support from family who resides nearby in [place], [place] School/Work: Pt is employed Self-Care: Pt drinks alcohol and smokes marijuana daily to cope with stressors Life Changes: Pt's grandmother passed away (Jan 2018), brother was sentenced to 20+ years in prison, mother's health is decreasing, and pt's teenage daughter has mental health concerns.   GOALS ADDRESSED: Patient will: 1. Reduce symptoms of: anxiety and depression 2. Increase knowledge and/or ability of: coping skills  3. Demonstrate ability to: Increase adequate support systems for patient/family, Decrease self-medicating behaviors and Begin healthy grieving over loss  INTERVENTIONS: Interventions utilized: Solution-Focused Strategies, Supportive Counseling, Psychoeducation and/or Health Education and Link to [facility]  Standardized Assessments completed: GAD-7 and PHQ 2&9  ASSESSMENT: Patient currently experiencing depression and anxiety  triggered by grief and ongoing substance use. Pt reports feelings of sadness and worry, difficulty sleeping, low energy, racing thoughts, decreased concentration, irritability. Denies SI/HI/AVH. She receives emotional support from family.   Patient may benefit from psychotherapy. She is participating in medication management through PCP. LCSWA educated pt on stages of grief and discussed how substance use can negatively impact one's mental and physical health. Pt successfully identified healthier coping skills, in addition, to complying with medication management. She is open to long-term counseling to address hx of trauma. LCSWA [facility] to assist with grief support for minor children. Resources for crisis intervention was provided.  PLAN: 1. Follow up with behavioral health clinician on : Pt was encouraged to contact LCSWA if symptoms worsen or fail to improve to [facility]. 2. Behavioral recommendations: LCSWA recommends that pt apply healthy coping skills discussed, comply with medication management, and utilize provided resources. Pt is encouraged to schedule follow up appointment with LCSWA 3. Referral(s): Community Mental Health Services (LME/Outside Clinic) 4. "From scale of 1-10, how likely are you to follow plan?": 10/10  Bridgett LarssonJasmine D Lewis, LCSW 02/25/17 4:49 PM

## 2017-02-20 NOTE — Progress Notes (Signed)
Subjective:  Patient ID: Melissa Cooley, female    DOB: 09-Sep-1980  Age: 36 y.o. MRN: [id number]  CC: Establish Care   HPI Melissa Cooley is a 36 year old female with a history of Hypertension who presents to establish care.  She was seen by the PA at her last visit and commenced on Losartan however she informs me her blood pressures are still elevated at home as she was previously on Losartan 170m but got restarted on 529m  She was also placed on Prozac for anxiety and depression which she states is not working. Her mind keeps racing and will not shut off and at other times she has low moods. She does have a positive family history of Bipolar disorder in her mom.  She complains of dyspepsia but denies abdominal pain, hematemesis or hematochezia. She does have diarrhea which she has had for the last 2 years with passage of watery stools sometimes up to 3 times/day.  She is also concerned about bruising on her arms and thighs with no preceding trauma which have been intermittent since the summer.    Past Medical History:  Diagnosis Date  . Anxiety   . Depression   . Gout   . Hypertension     Past Surgical History:  Procedure Laterality Date  . CESAREAN SECTION    . CESAREAN SECTION    . CHOLECYSTECTOMY    . TUBAL LIGATION      No Known Allergies   Outpatient Medications Prior to Visit  Medication Sig Dispense Refill  . FLUoxetine (PROZAC) 20 MG tablet Take 1 tablet (20 mg total) by mouth daily. 30 tablet 3  . losartan (COZAAR) 50 MG tablet Take 1 tablet (50 mg total) by mouth daily. 30 tablet 3  . Cholecalciferol (VITAMIN D3) 1000 units CAPS Take 1,000 Units by mouth daily.    . Marland Kitchenbuprofen (ADVIL,MOTRIN) 600 MG tablet Take 1 tablet (600 mg total) by mouth every 6 (six) hours as needed. (Patient not taking: Reported on 09/30/2016) 30 tablet 0  . norgestrel-ethinyl estradiol (LO/OVRAL,CRYSELLE) 0.3-30 MG-MCG tablet Take 1 tablet by mouth daily. (Patient not taking: Reported on  09/30/2016) 1 Package 11  . Vitamin D, Ergocalciferol, (DRISDOL) 50000 units CAPS capsule Take 1 capsule (50,000 Units total) by mouth every 7 (seven) days. (Patient not taking: Reported on 09/30/2016) 8 capsule 0  . oxyCODONE-acetaminophen (ROXICET) 5-325 MG tablet Take 1 tablet by mouth every 8 (eight) hours as needed for severe pain. (Patient not taking: Reported on 01/03/2016) 5 tablet 0   No facility-administered medications prior to visit.     ROS Review of Systems  Constitutional: Negative for activity change, appetite change and fatigue.  HENT: Negative for congestion, sinus pressure and sore throat.   Eyes: Negative for visual disturbance.  Respiratory: Negative for cough, chest tightness, shortness of breath and wheezing.   Cardiovascular: Negative for chest pain and palpitations.  Gastrointestinal: Negative for abdominal distention, abdominal pain and constipation.  Endocrine: Negative for polydipsia.  Genitourinary: Negative for dysuria and frequency.  Musculoskeletal: Negative for arthralgias and back pain.  Skin: Negative for rash.  Neurological: Negative for tremors, light-headedness and numbness.  Hematological: Bruises/bleeds easily.  Psychiatric/Behavioral: Negative for agitation and behavioral problems.    Objective:  BP 130/84   Pulse 66   Temp 98.1 F (36.7 C) (Oral)   Ht '5\' 11"'  (1.803 m)   Wt 238 lb 9.6 oz (108.2 kg)   LMP 02/02/2017   SpO2 100%   BMI 33.28 kg/m  BP/Weight 02/20/2017 01/07/2017 59/74/1638  Systolic BP 453 646 803  Diastolic BP 84 88 98  Wt. (Lbs) 238.6 240.8 -  BMI 33.28 33.58 -      Physical Exam  Constitutional: She is oriented to person, place, and time. She appears well-developed and well-nourished.  Cardiovascular: Normal rate, normal heart sounds and intact distal pulses.  No murmur heard. Pulmonary/Chest: Effort normal and breath sounds normal. She has no wheezes. She has no rales. She exhibits no tenderness.  Abdominal:  Soft. Bowel sounds are normal. She exhibits no distension and no mass. There is no tenderness.  Musculoskeletal: Normal range of motion.  Neurological: She is alert and oriented to person, place, and time.  Skin: Skin is warm and dry.  Few bruises on forearms  Psychiatric: She has a normal mood and affect. Her speech is rapid and/or pressured.    Depression screen [facility] 2/9 02/20/2017 01/07/2017 02/25/2016  Decreased Interest '1 1 2  ' Down, Depressed, Hopeless '1 2 2  ' PHQ - 2 Score '2 3 4  ' Altered sleeping '1 2 2  ' Tired, decreased energy 1 2 0  Change in appetite 0 2 0  Feeling bad or failure about yourself  1 2 0  Trouble concentrating '1 2 2  ' Moving slowly or fidgety/restless '3 3 2  ' Suicidal thoughts 0 0 0  PHQ-9 Score '9 16 10    ' GAD 7 : Generalized Anxiety Score 02/20/2017 01/07/2017 02/25/2016  Nervous, Anxious, on Edge '3 3 3  ' Control/stop worrying '3 3 3  ' Worry too much - different things '3 3 3  ' Trouble relaxing '3 3 2  ' Restless '3 3 3  ' Easily annoyed or irritable '3 3 3  ' Afraid - awful might happen '3 3 3  ' Total GAD 7 Score '21 21 20      ' Assessment & Plan:   1. Essential hypertension Increase losartan dose from 50 mg to 100 mg due to elevated blood pressures at home Low sodium, DASH diet - CMP14+EGFR - CBC with Differential/Platelet - hCG, serum, qualitative - Basic Metabolic Panel; Future - losartan (COZAAR) 100 MG tablet; Take 1 tablet (100 mg total) by mouth daily.  Dispense: 30 tablet; Refill: 3  2. Bipolar disorder without psychotic features ([place]) Placed on Depakote Discontinue Prozac LCSW called in for therapy She will need to see behavioral health for ongoing management-advised to apply for the [facility] financial discount to facilitate this. Discussed the need to remain on contraception-she is status post BTL - Ambulatory referral to Psychiatry  3. Irritable bowel syndrome with diarrhea Commence Bentyl - dicyclomine (BENTYL) 10 MG capsule; Take 1 capsule (10 mg  total) by mouth 4 (four) times daily -  before meals and at bedtime.  Dispense: 120 capsule; Refill: 3  4. Gastroesophageal reflux disease without esophagitis Avoid late meals We will place on PPI - pantoprazole (PROTONIX) 40 MG tablet; Take 1 tablet (40 mg total) by mouth daily.  Dispense: 30 tablet; Refill: 3  5. Bruising Unknown etiology - PT AND PTT   Meds ordered this encounter  Medications  . DISCONTD: losartan (COZAAR) 50 MG tablet    Sig: Take 2 tablets (100 mg total) by mouth daily.    Dispense:  30 tablet    Refill:  3    Discontinue previous dose  . dicyclomine (BENTYL) 10 MG capsule    Sig: Take 1 capsule (10 mg total) by mouth 4 (four) times daily -  before meals and at bedtime.    Dispense:  120  capsule    Refill:  3  . pantoprazole (PROTONIX) 40 MG tablet    Sig: Take 1 tablet (40 mg total) by mouth daily.    Dispense:  30 tablet    Refill:  3  . divalproex (DEPAKOTE) 500 MG DR tablet    Sig: Take 1 tablet (500 mg total) by mouth 2 (two) times daily.    Dispense:  60 tablet    Refill:  1  . losartan (COZAAR) 100 MG tablet    Sig: Take 1 tablet (100 mg total) by mouth daily.    Dispense:  30 tablet    Refill:  3    Discontinue 23m    Follow-up: Return in about 6 weeks (around 04/03/2017) for Follow-up of hypertension and bipolar.   EArnoldo MoraleMD

## 2017-02-21 LAB — CMP14+EGFR
ALBUMIN: 4.3 g/dL (ref 3.5–5.5)
ALK PHOS: 55 IU/L (ref 39–117)
ALT: 20 IU/L (ref 0–32)
AST: 23 IU/L (ref 0–40)
Albumin/Globulin Ratio: 1.4 (ref 1.2–2.2)
BILIRUBIN TOTAL: 0.6 mg/dL (ref 0.0–1.2)
BUN / CREAT RATIO: 12 (ref 9–23)
BUN: 8 mg/dL (ref 6–20)
CHLORIDE: 104 mmol/L (ref 96–106)
CO2: 19 mmol/L — ABNORMAL LOW (ref 20–29)
Calcium: 9 mg/dL (ref 8.7–10.2)
Creatinine, Ser: 0.67 mg/dL (ref 0.57–1.00)
GFR calc Af Amer: 131 mL/min/{1.73_m2} (ref >59)
GFR calc non Af Amer: 113 mL/min/{1.73_m2} (ref >59)
GLUCOSE: 82 mg/dL (ref 65–99)
Globulin, Total: 3.1 g/dL (ref 1.5–4.5)
Potassium: 4 mmol/L (ref 3.5–5.2)
SODIUM: 136 mmol/L (ref 134–144)
Total Protein: 7.4 g/dL (ref 6.0–8.5)

## 2017-02-21 LAB — PT AND PTT
INR: 1.1 (ref 0.8–1.2)
Prothrombin Time: 10.9 s (ref 9.1–12.0)
aPTT: 28 s (ref 24–33)

## 2017-02-21 LAB — CBC WITH DIFFERENTIAL/PLATELET
BASOS ABS: 0 10*3/uL (ref 0.0–0.2)
Basos: 0 %
EOS (ABSOLUTE): 0.1 10*3/uL (ref 0.0–0.4)
Eos: 1 %
Hematocrit: 40.9 % (ref 34.0–46.6)
Hemoglobin: 13.7 g/dL (ref 11.1–15.9)
Immature Grans (Abs): 0 10*3/uL (ref 0.0–0.1)
Immature Granulocytes: 0 %
LYMPHS ABS: 3.6 10*3/uL — AB (ref 0.7–3.1)
Lymphs: 52 %
MCH: 31.9 pg (ref 26.6–33.0)
MCHC: 33.5 g/dL (ref 31.5–35.7)
MCV: 95 fL (ref 79–97)
MONOCYTES: 7 %
MONOS ABS: 0.5 10*3/uL (ref 0.1–0.9)
Neutrophils Absolute: 2.8 10*3/uL (ref 1.4–7.0)
Neutrophils: 40 %
PLATELETS: 339 10*3/uL (ref 150–379)
RBC: 4.3 x10E6/uL (ref 3.77–5.28)
RDW: 14.1 % (ref 12.3–15.4)
WBC: 6.9 10*3/uL (ref 3.4–10.8)

## 2017-02-21 LAB — HCG, SERUM, QUALITATIVE: HCG, BETA SUBUNIT, QUAL, SERUM: NEGATIVE m[IU]/mL (ref <6)

## 2017-02-24 ENCOUNTER — Encounter: Payer: Self-pay | Admitting: Family Medicine

## 2017-02-24 LAB — SPECIMEN STATUS REPORT

## 2017-02-24 LAB — HEMOGLOBIN A1C
Est. average glucose Bld gHb Est-mCnc: 111 mg/dL
Hgb A1c MFr Bld: 5.5 % (ref 4.8–5.6)

## 2017-02-26 ENCOUNTER — Telehealth: Payer: Self-pay

## 2017-02-26 NOTE — Telephone Encounter (Signed)
Pt was called and informed of normal lab results. 

## 2017-04-01 ENCOUNTER — Other Ambulatory Visit: Payer: Self-pay | Admitting: Family Medicine

## 2017-04-01 DIAGNOSIS — F319 Bipolar disorder, unspecified: Secondary | ICD-10-CM

## 2017-04-01 MED FILL — LOSARTAN POTASSIUM 50 MG TA: 50 | 30 days supply | Qty: 60 | Fill #1

## 2017-04-03 ENCOUNTER — Ambulatory Visit: Payer: Self-pay | Attending: Family Medicine | Admitting: Family Medicine

## 2017-04-03 ENCOUNTER — Encounter: Payer: Self-pay | Admitting: Family Medicine

## 2017-04-03 VITALS — BP 132/84 | HR 68 | Temp 97.9°F | Ht 71.0 in | Wt 247.0 lb

## 2017-04-03 DIAGNOSIS — Z9889 Other specified postprocedural states: Secondary | ICD-10-CM | POA: Insufficient documentation

## 2017-04-03 DIAGNOSIS — Z6841 Body Mass Index (BMI) 40.0 and over, adult: Secondary | ICD-10-CM

## 2017-04-03 DIAGNOSIS — K219 Gastro-esophageal reflux disease without esophagitis: Secondary | ICD-10-CM

## 2017-04-03 DIAGNOSIS — Z79899 Other long term (current) drug therapy: Secondary | ICD-10-CM | POA: Insufficient documentation

## 2017-04-03 DIAGNOSIS — F319 Bipolar disorder, unspecified: Secondary | ICD-10-CM

## 2017-04-03 DIAGNOSIS — R197 Diarrhea, unspecified: Secondary | ICD-10-CM | POA: Insufficient documentation

## 2017-04-03 DIAGNOSIS — K009 Disorder of tooth development, unspecified: Secondary | ICD-10-CM

## 2017-04-03 DIAGNOSIS — Z9851 Tubal ligation status: Secondary | ICD-10-CM | POA: Insufficient documentation

## 2017-04-03 DIAGNOSIS — E669 Obesity, unspecified: Secondary | ICD-10-CM | POA: Insufficient documentation

## 2017-04-03 DIAGNOSIS — E66813 Obesity, class 3: Secondary | ICD-10-CM

## 2017-04-03 DIAGNOSIS — K580 Irritable bowel syndrome with diarrhea: Secondary | ICD-10-CM

## 2017-04-03 DIAGNOSIS — Z6834 Body mass index (BMI) 34.0-34.9, adult: Secondary | ICD-10-CM | POA: Insufficient documentation

## 2017-04-03 DIAGNOSIS — I10 Essential (primary) hypertension: Secondary | ICD-10-CM

## 2017-04-03 DIAGNOSIS — Z9049 Acquired absence of other specified parts of digestive tract: Secondary | ICD-10-CM | POA: Insufficient documentation

## 2017-04-03 NOTE — Progress Notes (Signed)
Subjective:  Patient ID: Melissa Cooley, female    DOB: 03-07-1981  Age: 37 y.o. MRN: [id number]  CC: Hypertension   HPI Melissa Cooley is a 37 year old female with a history of hypertension, GERD, IBS, bipolar disorder who presents today for follow-up visit.  At her last office visit the dose of Losartan had been increased due to suboptimal blood pressure control and she reports improvement in her blood pressures which have been controlled lately. She was also commenced on Bentyl due to symptoms consistent with IBS and was also placed on Protonix due to gastroesophageal reflux symptoms and she reports doing well on these medications. Denies nausea, vomiting, constipation or diarrhea.  I had commenced her on Depakote and referred to behavioral health for management of bipolar disorder and she reports improvement in her symptoms with resolution of her racing thoughts however she has gained 9 pounds in the last 5 weeks and has noticed intermenstrual spotting. She is unhappy with these adverse effects and has since discontinued it; she has an upcoming appointment with [facility] later this month.  She is requesting referral to a dentist for dental pain as she has not seen one in a while.  Past Medical History:  Diagnosis Date  . Anxiety   . Depression   . Gout   . Hypertension     Past Surgical History:  Procedure Laterality Date  . CESAREAN SECTION    . CESAREAN SECTION    . CHOLECYSTECTOMY    . TUBAL LIGATION      No Known Allergies   Outpatient Medications Prior to Visit  Medication Sig Dispense Refill  . Cholecalciferol (VITAMIN D3) 1000 units CAPS Take 1,000 Units by mouth daily.    . divalproex (DEPAKOTE) 500 MG DR tablet Take 1 tablet (500 mg total) by mouth 2 (two) times daily. 60 tablet 1  . losartan (COZAAR) 100 MG tablet Take 1 tablet (100 mg total) by mouth daily. 30 tablet 3  . pantoprazole (PROTONIX) 40 MG tablet Take 1 tablet (40 mg total) by mouth daily. 30 tablet 3    . dicyclomine (BENTYL) 10 MG capsule Take 1 capsule (10 mg total) by mouth 4 (four) times daily -  before meals and at bedtime. (Patient not taking: Reported on 04/03/2017) 120 capsule 3  . ibuprofen (ADVIL,MOTRIN) 600 MG tablet Take 1 tablet (600 mg total) by mouth every 6 (six) hours as needed. (Patient not taking: Reported on 09/30/2016) 30 tablet 0  . norgestrel-ethinyl estradiol (LO/OVRAL,CRYSELLE) 0.3-30 MG-MCG tablet Take 1 tablet by mouth daily. (Patient not taking: Reported on 09/30/2016) 1 Package 11  . Vitamin D, Ergocalciferol, (DRISDOL) 50000 units CAPS capsule Take 1 capsule (50,000 Units total) by mouth every 7 (seven) days. (Patient not taking: Reported on 09/30/2016) 8 capsule 0   No facility-administered medications prior to visit.     ROS Review of Systems  Constitutional: Positive for appetite change. Negative for activity change and fatigue.  HENT: Positive for dental problem. Negative for congestion, sinus pressure and sore throat.   Eyes: Negative for visual disturbance.  Respiratory: Negative for cough, chest tightness, shortness of breath and wheezing.   Cardiovascular: Negative for chest pain and palpitations.  Gastrointestinal: Negative for abdominal distention, abdominal pain and constipation.  Endocrine: Negative for polydipsia.  Genitourinary: Negative for dysuria and frequency.  Musculoskeletal: Negative for arthralgias and back pain.  Skin: Negative for rash.  Neurological: Negative for tremors, light-headedness and numbness.  Hematological: Does not bruise/bleed easily.  Psychiatric/Behavioral: Negative for agitation and  behavioral problems.    Objective:  BP 132/84   Pulse 68   Temp 97.9 F (36.6 C) (Oral)   Ht 5\' 11"  (1.803 m)   Wt 247 lb (112 kg)   SpO2 99%   BMI 34.45 kg/m   BP/Weight 04/03/2017 02/20/2017 01/07/2017  Systolic BP 132 130 130  Diastolic BP 84 84 88  Wt. (Lbs) 247 238.6 240.8  BMI 34.45 33.28 33.58      Physical Exam   Constitutional: She is oriented to person, place, and time. She appears well-developed and well-nourished.  Cardiovascular: Normal rate, normal heart sounds and intact distal pulses.  No murmur heard. Pulmonary/Chest: Effort normal and breath sounds normal. She has no wheezes. She has no rales. She exhibits no tenderness.  Abdominal: Soft. Bowel sounds are normal. She exhibits no distension and no mass. There is no tenderness.  Musculoskeletal: Normal range of motion.  Neurological: She is alert and oriented to person, place, and time.  Skin: Skin is warm and dry.  Psychiatric: She has a normal mood and affect.     Assessment & Plan:   1. Essential hypertension Controlled on losartan Counseled on blood pressure goal of less than 130/80, low-sodium, DASH diet, medication compliance, 150 minutes of moderate intensity exercise per week. Discussed medication compliance, adverse effects.   2. Irritable bowel syndrome with diarrhea Doing well on Bentyl  3. Gastroesophageal reflux disease without esophagitis Controlled on pantoprazole Avoid late meals  4. Bipolar disease, chronic (HCC) Symptoms improved on Depakote however will discontinue this due to weight gain of 9 pounds in the last 6 weeks She has an upcoming appointment with [facility] in 04/10/17 and has been advised to keep this  5. Class 3 severe obesity due to excess calories without serious comorbidity with body mass index (BMI) of 40.0 to 44.9 in adult [facility](HCC) Discussed reducing portion sizes, avoiding late meals, increasing physical activity  6. Dental anomaly - Ambulatory referral to Dentistry   No orders of the defined types were placed in this encounter.   Follow-up: Return in about 3 months (around 07/02/2017) for follow up of chronic medical conditions.   Jaclyn ShaggyEnobong Amao MD

## 2017-04-03 NOTE — Patient Instructions (Signed)

## 2017-04-10 ENCOUNTER — Encounter (HOSPITAL_COMMUNITY): Payer: Self-pay | Admitting: Psychiatry

## 2017-04-10 ENCOUNTER — Ambulatory Visit (INDEPENDENT_AMBULATORY_CARE_PROVIDER_SITE_OTHER): Payer: No Typology Code available for payment source | Admitting: Psychiatry

## 2017-04-10 ENCOUNTER — Telehealth (HOSPITAL_COMMUNITY): Payer: Self-pay | Admitting: Psychiatry

## 2017-04-10 VITALS — BP 126/74 | HR 61 | Ht 71.0 in | Wt 240.4 lb

## 2017-04-10 DIAGNOSIS — Z87891 Personal history of nicotine dependence: Secondary | ICD-10-CM

## 2017-04-10 DIAGNOSIS — Z818 Family history of other mental and behavioral disorders: Secondary | ICD-10-CM

## 2017-04-10 DIAGNOSIS — F1010 Alcohol abuse, uncomplicated: Secondary | ICD-10-CM

## 2017-04-10 DIAGNOSIS — Z62810 Personal history of physical and sexual abuse in childhood: Secondary | ICD-10-CM

## 2017-04-10 DIAGNOSIS — Z62811 Personal history of psychological abuse in childhood: Secondary | ICD-10-CM

## 2017-04-10 DIAGNOSIS — F411 Generalized anxiety disorder: Secondary | ICD-10-CM

## 2017-04-10 DIAGNOSIS — Z813 Family history of other psychoactive substance abuse and dependence: Secondary | ICD-10-CM

## 2017-04-10 DIAGNOSIS — Z79899 Other long term (current) drug therapy: Secondary | ICD-10-CM

## 2017-04-10 DIAGNOSIS — F319 Bipolar disorder, unspecified: Secondary | ICD-10-CM

## 2017-04-10 DIAGNOSIS — F1210 Cannabis abuse, uncomplicated: Secondary | ICD-10-CM

## 2017-04-10 MED ORDER — QUETIAPINE FUMARATE 100 MG PO TABS: 100.0000 mg | ORAL_TABLET | Freq: Every day | ORAL | 0 refills | Status: DC

## 2017-04-10 MED ORDER — HYDROXYZINE HCL 10 MG PO TABS: ORAL_TABLET | ORAL | 0 refills | Status: DC

## 2017-04-10 MED FILL — hydrOXYzine HCL 10 MG TABS: 10 | 30 days supply | Qty: 60 | Fill #0

## 2017-04-10 MED FILL — QUETIAPINE FUMARATE 100 MG: 100 | 30 days supply | Qty: 30 | Fill #0
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
## 2017-04-30 ENCOUNTER — Telehealth: Payer: Self-pay | Admitting: Family Medicine

## 2017-04-30 DIAGNOSIS — I10 Essential (primary) hypertension: Secondary | ICD-10-CM

## 2017-04-30 DIAGNOSIS — K219 Gastro-esophageal reflux disease without esophagitis: Secondary | ICD-10-CM

## 2017-04-30 MED ORDER — LOSARTAN POTASSIUM 100 MG PO TABS: 100.0000 mg | ORAL_TABLET | Freq: Every day | ORAL | 3 refills | Status: DC

## 2017-04-30 MED ORDER — PANTOPRAZOLE SODIUM 40 MG PO TBEC: 40.0000 mg | DELAYED_RELEASE_TABLET | Freq: Every day | ORAL | 3 refills | Status: DC

## 2017-04-30 MED FILL — LOSARTAN POTASSIUM 50 MG TA: 50 | 30 days supply | Qty: 30 | Fill #2

## 2017-04-30 NOTE — Telephone Encounter (Signed)
Refilled

## 2017-04-30 NOTE — Telephone Encounter (Signed)
Patient called requesting a refill on her losartan please fu with patient she only has 4 pills left. And she also needs a refill on protonix.

## 2017-05-04 MED FILL — ?PANTOPRAZOLE SOD DR 40MG: 40 MG | 30 days supply | Qty: 30 | Fill #1

## 2017-05-11 ENCOUNTER — Ambulatory Visit: Payer: Self-pay | Attending: Family Medicine | Admitting: Family Medicine

## 2017-05-11 ENCOUNTER — Encounter: Payer: Self-pay | Admitting: Family Medicine

## 2017-05-11 VITALS — BP 121/78 | HR 70 | Temp 98.0°F | Ht 71.0 in | Wt 242.4 lb

## 2017-05-11 DIAGNOSIS — Z9851 Tubal ligation status: Secondary | ICD-10-CM | POA: Insufficient documentation

## 2017-05-11 DIAGNOSIS — M109 Gout, unspecified: Secondary | ICD-10-CM | POA: Insufficient documentation

## 2017-05-11 DIAGNOSIS — N61 Mastitis without abscess: Secondary | ICD-10-CM | POA: Insufficient documentation

## 2017-05-11 DIAGNOSIS — F419 Anxiety disorder, unspecified: Secondary | ICD-10-CM | POA: Insufficient documentation

## 2017-05-11 DIAGNOSIS — I10 Essential (primary) hypertension: Secondary | ICD-10-CM | POA: Insufficient documentation

## 2017-05-11 DIAGNOSIS — Z803 Family history of malignant neoplasm of breast: Secondary | ICD-10-CM | POA: Insufficient documentation

## 2017-05-11 DIAGNOSIS — N644 Mastodynia: Secondary | ICD-10-CM | POA: Insufficient documentation

## 2017-05-11 DIAGNOSIS — Z79899 Other long term (current) drug therapy: Secondary | ICD-10-CM | POA: Insufficient documentation

## 2017-05-11 LAB — POCT URINE PREGNANCY: Preg Test, Ur: NEGATIVE

## 2017-05-11 MED ORDER — CEPHALEXIN 500 MG PO CAPS: 500.0000 mg | ORAL_CAPSULE | Freq: Two times a day (BID) | ORAL | 0 refills | Status: DC

## 2017-05-11 MED FILL — CEPHALEXIN 500 MG CAPSULE: 500 | 10 days supply | Qty: 20 | Fill #0

## 2017-05-11 NOTE — Patient Instructions (Signed)
Mastitis °Mastitis is redness, soreness, and puffiness (inflammation) in an area of the breast. It is often caused by an infection that occurs when bacteria enter the skin. The infection is often helped by antibiotic medicine. °Follow these instructions at home: °· Only take medicines as told by your doctor. °· If your doctor prescribed an antibiotic medicine, take it as told. Finish it even if you start to feel better. °· Do not wear a tight or underwire bra. Wear a soft support bra. °· Drink more fluids, especially if you have a fever. °· If you are breastfeeding: °? Keep emptying the breast. Your doctor can tell you if the milk is safe. Use a breast pump if you are told to stop nursing. °? Keep your nipples clean and dry. °? Empty the first breast before going to the other breast. Use a breast pump if your baby is not emptying your breast. °? If you go back to work, pump your breasts while at work. °? Avoid letting your breasts get overly filled with milk (engorged). °Contact a doctor if: °· You have pus-like fluid leaking from your breast. °· Your symptoms do not get better within 2 days. °Get help right away if: °· Your pain and puffiness are getting worse. °· Your pain is not helped by medicine. °· You have a red line going from your breast toward your armpit. °· You have a fever or lasting symptoms for more than 2-3 days. °· You have a fever and your symptoms suddenly get worse. °This information is not intended to replace advice given to you by your health care provider. Make sure you discuss any questions you have with your health care provider. °Document Released: 02/19/2009 Document Revised: 08/09/2015 Document Reviewed: 10/01/2012 °Elsevier Interactive Patient Education © 2017 [employer] ° °

## 2017-05-11 NOTE — Progress Notes (Signed)
Subjective:  Patient ID: Melissa Cooley, female    DOB: Feb 21, 1981  Age: 37 y.o. MRN: [id number]  CC: Breast Pain   HPI Melissa Cooley  is a 37 year old female with a history of hypertension, GERD, IBS, bipolar disorder who presents today with left nipple pain for the last 1 week which she describes as nauseating and radiating upward into her breast but denies nipple discharge. A couple of years back she had similar symptoms which occurred in both breast and was associated with bilateral nipple discharge. She is not nursing and denies a history of fevers or palpation of breast lumps. She has a positive history of breast cancer in her mom was diagnosed at the age of 44. LMP was 04/27/17 and she is status post bilateral tubal ligation. Last mammogram from 03/2016 was negative for malignancy.  Past Medical History:  Diagnosis Date  . Anxiety   . Depression   . Gout   . Hypertension     Past Surgical History:  Procedure Laterality Date  . CESAREAN SECTION    . CESAREAN SECTION    . CHOLECYSTECTOMY    . TUBAL LIGATION      No Known Allergies   Outpatient Medications Prior to Visit  Medication Sig Dispense Refill  . hydrOXYzine (ATARAX/VISTARIL) 10 MG tablet Take 1-2 tab daily as needed for anziety 60 tablet 0  . ibuprofen (ADVIL,MOTRIN) 600 MG tablet Take 1 tablet (600 mg total) by mouth every 6 (six) hours as needed. 30 tablet 0  . losartan (COZAAR) 100 MG tablet Take 1 tablet (100 mg total) by mouth daily. 30 tablet 3  . pantoprazole (PROTONIX) 40 MG tablet Take 1 tablet (40 mg total) by mouth daily. 30 tablet 3  . Vitamin D, Ergocalciferol, (DRISDOL) 50000 units CAPS capsule Take 1 capsule (50,000 Units total) by mouth every 7 (seven) days. 8 capsule 0  . QUEtiapine (SEROQUEL) 100 MG tablet Take 1 tablet (100 mg total) by mouth at bedtime. (Patient not taking: Reported on 05/11/2017) 30 tablet 0   No facility-administered medications prior to visit.     ROS Review of Systems    Constitutional: Negative for activity change, appetite change and fatigue.  HENT: Negative for congestion, sinus pressure and sore throat.   Eyes: Negative for visual disturbance.  Respiratory: Negative for cough, chest tightness, shortness of breath and wheezing.   Cardiovascular: Negative for chest pain and palpitations.  Gastrointestinal: Negative for abdominal distention, abdominal pain and constipation.  Endocrine: Negative for polydipsia.  Genitourinary: Negative for dysuria and frequency.  Musculoskeletal: Negative for arthralgias and back pain.  Skin: Negative for rash.  Neurological: Negative for tremors, light-headedness and numbness.  Hematological: Does not bruise/bleed easily.  Psychiatric/Behavioral: Negative for agitation and behavioral problems.    Objective:  BP 121/78   Pulse 70   Temp 98 F (36.7 C) (Oral)   Ht 5\' 11"  (1.803 m)   Wt 242 lb 6.4 oz (110 kg)   SpO2 99%   BMI 33.81 kg/m   BP/Weight 05/11/2017 04/10/2017 04/03/2017  Systolic BP 121 126 132  Diastolic BP 78 74 84  Wt. (Lbs) 242.4 240.4 247  BMI 33.81 33.53 34.45      Physical Exam  Pulmonary/Chest: Right breast exhibits no inverted nipple, no nipple discharge and no tenderness. Left breast exhibits tenderness (TTP of left nipple, no skin changes, no warmth). Left breast exhibits no inverted nipple and no nipple discharge.     Assessment & Plan:   1. Mastitis, left, acute  Advised to apply cool compress as tolerated - cephALEXin (KEFLEX) 500 MG capsule; Take 1 capsule (500 mg total) by mouth 2 (two) times daily.  Dispense: 20 capsule; Refill: 0  2. Family history of breast cancer in mother Will order diagnostic mammogram and left breast ultrasound at next visit given she is at high risk   Meds ordered this encounter  Medications  . cephALEXin (KEFLEX) 500 MG capsule    Sig: Take 1 capsule (500 mg total) by mouth 2 (two) times daily.    Dispense:  20 capsule    Refill:  0     Follow-up: Return in about 2 weeks (around 05/25/2017) for Follow-up of mastitis.   Hoy RegisterEnobong Vidhi Delellis MD

## 2017-05-18 ENCOUNTER — Other Ambulatory Visit: Payer: Self-pay

## 2017-05-18 DIAGNOSIS — I10 Essential (primary) hypertension: Secondary | ICD-10-CM

## 2017-05-18 MED ORDER — LOSARTAN POTASSIUM 100 MG PO TABS: 100.0000 mg | ORAL_TABLET | Freq: Every day | ORAL | 3 refills | Status: DC

## 2017-05-18 MED FILL — LOSARTAN POTASSIUM 100 MG T: 100 | 30 days supply | Qty: 30 | Fill #0

## 2017-05-20 ENCOUNTER — Ambulatory Visit (HOSPITAL_COMMUNITY): Payer: Self-pay | Admitting: Psychiatry

## 2017-05-25 ENCOUNTER — Ambulatory Visit: Payer: Self-pay | Admitting: Family Medicine

## 2017-06-17 MED FILL — LOSARTAN POTASSIUM 100 MG T: 100 | 30 days supply | Qty: 30 | Fill #1

## 2017-06-19 MED FILL — ?PANTOPRAZOLE SO DR 40MG TA: 40 | 30 days supply | Qty: 30 | Fill #2

## 2017-06-30 ENCOUNTER — Encounter: Payer: Self-pay | Admitting: Family Medicine

## 2017-06-30 ENCOUNTER — Ambulatory Visit: Payer: Self-pay | Attending: Family Medicine | Admitting: Family Medicine

## 2017-06-30 VITALS — BP 124/86 | HR 69 | Temp 98.1°F | Ht 71.0 in | Wt 250.2 lb

## 2017-06-30 DIAGNOSIS — Z9851 Tubal ligation status: Secondary | ICD-10-CM | POA: Insufficient documentation

## 2017-06-30 DIAGNOSIS — Z791 Long term (current) use of non-steroidal anti-inflammatories (NSAID): Secondary | ICD-10-CM | POA: Insufficient documentation

## 2017-06-30 DIAGNOSIS — I10 Essential (primary) hypertension: Secondary | ICD-10-CM | POA: Insufficient documentation

## 2017-06-30 DIAGNOSIS — R635 Abnormal weight gain: Secondary | ICD-10-CM

## 2017-06-30 DIAGNOSIS — K589 Irritable bowel syndrome without diarrhea: Secondary | ICD-10-CM | POA: Insufficient documentation

## 2017-06-30 DIAGNOSIS — Z9049 Acquired absence of other specified parts of digestive tract: Secondary | ICD-10-CM | POA: Insufficient documentation

## 2017-06-30 DIAGNOSIS — F411 Generalized anxiety disorder: Secondary | ICD-10-CM

## 2017-06-30 DIAGNOSIS — F319 Bipolar disorder, unspecified: Secondary | ICD-10-CM | POA: Insufficient documentation

## 2017-06-30 DIAGNOSIS — Z803 Family history of malignant neoplasm of breast: Secondary | ICD-10-CM | POA: Insufficient documentation

## 2017-06-30 DIAGNOSIS — N644 Mastodynia: Secondary | ICD-10-CM | POA: Insufficient documentation

## 2017-06-30 DIAGNOSIS — Z79899 Other long term (current) drug therapy: Secondary | ICD-10-CM | POA: Insufficient documentation

## 2017-06-30 DIAGNOSIS — Z9889 Other specified postprocedural states: Secondary | ICD-10-CM | POA: Insufficient documentation

## 2017-06-30 DIAGNOSIS — M109 Gout, unspecified: Secondary | ICD-10-CM | POA: Insufficient documentation

## 2017-06-30 DIAGNOSIS — K219 Gastro-esophageal reflux disease without esophagitis: Secondary | ICD-10-CM | POA: Insufficient documentation

## 2017-06-30 MED ORDER — HYDROXYZINE HCL 10 MG PO TABS: ORAL_TABLET | ORAL | 0 refills | Status: DC

## 2017-06-30 NOTE — Patient Instructions (Signed)

## 2017-06-30 NOTE — Progress Notes (Signed)
Subjective:  Patient ID: Melissa Cooley, female    DOB: 1980/03/29  Age: 37 y.o. MRN: [id number]  CC: Hypertension   HPI Melissa Cooley is a 37 year old female with a history of hypertension, GERD, IBS, bipolar disorder who completed a course of Keflex which she received for mastitis at her last office visit  7 weeks ago and reports near complete resolution of symptoms .A couple of years back she had similar symptoms which occurred in both breast and was associated with bilateral nipple discharge. She is not nursing and denies a history of fevers or palpation of breast lumps. She has a positive history of breast cancer in her mom was diagnosed at the age of 37. Last mammogram from 03/2016 was negative for malignancy. She is concerned and would still like to have a mammogram.  She scored 20 on her GAD 7 screen and is followed by psychiatry, Dr Lolly MustacheArfeen who manages her bipolar disorder however she discontinued Seroquel due to excessive sedation and has been out of hydroxyzine. Her last visit to psychiatry was in 03/2017 Reports hydroxyzine helped her symptoms. Doing well on her her antihypertensive  Complains of feeling "weird" and has gained weight lately which concerns her (gained 8 pounds in the last 6 weeks); she would like her thyroid checked.  She also notices heat intolerance at night. She does not exercise regularly and has not  paid any particular attention to modifying her diet.  Past Medical History:  Diagnosis Date  . Anxiety   . Depression   . Gout   . Hypertension     Past Surgical History:  Procedure Laterality Date  . CESAREAN SECTION    . CESAREAN SECTION    . CHOLECYSTECTOMY    . TUBAL LIGATION      No Known Allergies   Outpatient Medications Prior to Visit  Medication Sig Dispense Refill  . hydrOXYzine (ATARAX/VISTARIL) 10 MG tablet Take 1-2 tab daily as needed for anziety 60 tablet 0  . losartan (COZAAR) 100 MG tablet Take 1 tablet (100 mg total) by mouth daily.  30 tablet 3  . pantoprazole (PROTONIX) 40 MG tablet Take 1 tablet (40 mg total) by mouth daily. 30 tablet 3  . Vitamin D, Ergocalciferol, (DRISDOL) 50000 units CAPS capsule Take 1 capsule (50,000 Units total) by mouth every 7 (seven) days. 8 capsule 0  . cephALEXin (KEFLEX) 500 MG capsule Take 1 capsule (500 mg total) by mouth 2 (two) times daily. (Patient not taking: Reported on 06/30/2017) 20 capsule 0  . ibuprofen (ADVIL,MOTRIN) 600 MG tablet Take 1 tablet (600 mg total) by mouth every 6 (six) hours as needed. (Patient not taking: Reported on 06/30/2017) 30 tablet 0  . QUEtiapine (SEROQUEL) 100 MG tablet Take 1 tablet (100 mg total) by mouth at bedtime. (Patient not taking: Reported on 05/11/2017) 30 tablet 0   No facility-administered medications prior to visit.     ROS Review of Systems  Constitutional: Negative for activity change, appetite change and fatigue.  HENT: Negative for congestion, sinus pressure and sore throat.   Eyes: Negative for visual disturbance.  Respiratory: Negative for cough, chest tightness, shortness of breath and wheezing.   Cardiovascular: Negative for chest pain and palpitations.  Gastrointestinal: Negative for abdominal distention, abdominal pain and constipation.  Endocrine: Negative for polydipsia.  Genitourinary: Negative for dysuria and frequency.  Musculoskeletal: Negative for arthralgias and back pain.  Skin: Negative for rash.  Neurological: Negative for tremors, light-headedness and numbness.  Hematological: Does not bruise/bleed easily.  Psychiatric/Behavioral: Negative for agitation and behavioral problems.    Objective:  BP 124/86   Pulse 69   Temp 98.1 F (36.7 C) (Oral)   Ht 5\' 11"  (1.803 m)   Wt 250 lb 3.2 oz (113.5 kg)   SpO2 100%   BMI 34.90 kg/m   BP/Weight 06/30/2017 05/11/2017 04/03/2017  Systolic BP 124 121 132  Diastolic BP 86 78 84  Wt. (Lbs) 250.2 242.4 247  BMI 34.9 33.81 34.45  Some encounter information is confidential  and restricted. Go to Review Flowsheets activity to see all data.      Physical Exam  Constitutional: She is oriented to person, place, and time. She appears well-developed and well-nourished.  Cardiovascular: Normal rate, normal heart sounds and intact distal pulses.  No murmur heard. Pulmonary/Chest: Effort normal and breath sounds normal. She has no wheezes. She has no rales. She exhibits no tenderness.  Abdominal: Soft. Bowel sounds are normal. She exhibits no distension and no mass. There is no tenderness.  Musculoskeletal: Normal range of motion.  Neurological: She is alert and oriented to person, place, and time.  Skin: Skin is warm and dry.  Psychiatric: She has a normal mood and affect.     Assessment & Plan:  Generalized anxiety disorder/bipolar disorder Refill hydroxyzine Advised to reschedule appointment with psychiatrist  Breast pain Completed treatment for mastitis Family history of breast cancer in mom at an early age Referred for diagnostic mammogram  Weight gain We will send of labs-TSH, LH/FSH Reduce portion sizes Increase physical activity to 150 minutes of moderate intensity exercise per week  Hypertension Controlled  There are no diagnoses linked to this encounter.  No orders of the defined types were placed in this encounter.   Follow-up: No follow-ups on file.   Hoy Register MD

## 2017-07-01 LAB — FSH/LH
FSH: 5.4 m[IU]/mL
LH: 6.5 m[IU]/mL

## 2017-07-01 LAB — TSH: TSH: 0.74 u[IU]/mL (ref 0.450–4.500)

## 2017-07-02 ENCOUNTER — Telehealth: Payer: Self-pay

## 2017-07-02 NOTE — Telephone Encounter (Signed)
Patient was called and informed of lab results. 

## 2017-07-03 ENCOUNTER — Ambulatory Visit: Payer: Self-pay | Admitting: Family Medicine

## 2017-07-16 MED FILL — hydrOXYzine HCL 10 MG TABS: 10 | 30 days supply | Qty: 60 | Fill #0

## 2017-07-16 MED FILL — LOSARTAN POTASSIUM 100 MG T: 100 | 30 days supply | Qty: 30 | Fill #2

## 2017-07-29 ENCOUNTER — Ambulatory Visit: Payer: Self-pay | Attending: Family Medicine | Admitting: Physician Assistant

## 2017-07-29 VITALS — BP 122/80 | HR 70 | Temp 98.4°F | Resp 16 | Ht 71.0 in | Wt 244.6 lb

## 2017-07-29 DIAGNOSIS — F419 Anxiety disorder, unspecified: Secondary | ICD-10-CM | POA: Insufficient documentation

## 2017-07-29 DIAGNOSIS — F329 Major depressive disorder, single episode, unspecified: Secondary | ICD-10-CM | POA: Insufficient documentation

## 2017-07-29 DIAGNOSIS — R350 Frequency of micturition: Secondary | ICD-10-CM | POA: Insufficient documentation

## 2017-07-29 DIAGNOSIS — I10 Essential (primary) hypertension: Secondary | ICD-10-CM | POA: Insufficient documentation

## 2017-07-29 DIAGNOSIS — Z202 Contact with and (suspected) exposure to infections with a predominantly sexual mode of transmission: Secondary | ICD-10-CM | POA: Insufficient documentation

## 2017-07-29 DIAGNOSIS — M109 Gout, unspecified: Secondary | ICD-10-CM | POA: Insufficient documentation

## 2017-07-29 DIAGNOSIS — L298 Other pruritus: Secondary | ICD-10-CM | POA: Insufficient documentation

## 2017-07-29 DIAGNOSIS — Z79899 Other long term (current) drug therapy: Secondary | ICD-10-CM | POA: Insufficient documentation

## 2017-07-29 DIAGNOSIS — N898 Other specified noninflammatory disorders of vagina: Secondary | ICD-10-CM

## 2017-07-29 LAB — POCT URINALYSIS DIPSTICK
BILIRUBIN UA: NEGATIVE
Blood, UA: NEGATIVE
GLUCOSE UA: NEGATIVE
Ketones, UA: NEGATIVE
Leukocytes, UA: NEGATIVE
Nitrite, UA: NEGATIVE
PH UA: 6.5 (ref 5.0–8.0)
Spec Grav, UA: 1.02 (ref 1.010–1.025)
Urobilinogen, UA: 0.2 E.U./dL

## 2017-07-29 NOTE — Progress Notes (Signed)
Pt. Stated she think her PH is off, a lot of anal itching, abdomen pressure.  Pt. Stated "something is in her urethra."

## 2017-07-29 NOTE — Progress Notes (Signed)
Patient ID: Melissa Cooley, female   DOB: Mar 22, 1980, 37 y.o.   MRN: [id number]     Melissa Cooley, is a 37 y.o. female  [id number]  [id number]  DOB - 21-Apr-1980  Subjective:  Chief Complaint and HPI: Melissa Cooley is a 37 y.o. female here today for urinary/vaginal s/sx and wants STI screening.  No sex for 7 months.  Now in Long distance relationship.  At [facility] 07/10/2017.  Had lots of sex while on vacation with him.  Started having pelvic pressure and vaginal odor about 1 week after returning.  No other partners.  No f/c.  No N/V.  Occasional urinary frequency and on and off dysuria.  No abdominal pain.  Mild vaginal d/c.  Has appt for pap 09/03/2017.  Some dyspareunia.       ROS:   Constitutional:  No f/c, No night sweats, No unexplained weight loss. EENT:  No vision changes, No blurry vision, No hearing changes. No mouth, throat, or ear problems.  Respiratory: No cough, No SOB Cardiac: No CP, no palpitations GI:  No abd pain, No N/V/D. GU: + Urinary s/sx, some vaginal discharge.  No pelvic pain Musculoskeletal: No joint pain Neuro: No headache, no dizziness, no motor weakness.  Skin: No rash Endocrine:  No polydipsia. No polyuria.  Psych: Denies SI/HI  No problems updated.  ALLERGIES: No Known Allergies  PAST MEDICAL HISTORY: Past Medical History:  Diagnosis Date  . Anxiety   . Depression   . Gout   . Hypertension     MEDICATIONS AT HOME: Prior to Admission medications   Medication Sig Start Date End Date Taking? Authorizing Provider  hydrOXYzine (ATARAX/VISTARIL) 10 MG tablet Take 1-2 tab daily as needed for anziety 06/30/17  Yes Newlin, Enobong, MD  losartan (COZAAR) 100 MG tablet Take 1 tablet (100 mg total) by mouth daily. 05/18/17  Yes Hoy Register, MD  pantoprazole (PROTONIX) 40 MG tablet Take 1 tablet (40 mg total) by mouth daily. 04/30/17  Yes Hoy Register, MD  cephALEXin (KEFLEX) 500 MG capsule Take 1 capsule (500 mg total) by mouth 2 (two) times  daily. Patient not taking: Reported on 06/30/2017 05/11/17   Hoy Register, MD  ibuprofen (ADVIL,MOTRIN) 600 MG tablet Take 1 tablet (600 mg total) by mouth every 6 (six) hours as needed. Patient not taking: Reported on 06/30/2017 08/12/16   Harolyn Rutherford C, PA-C  QUEtiapine (SEROQUEL) 100 MG tablet Take 1 tablet (100 mg total) by mouth at bedtime. Patient not taking: Reported on 05/11/2017 04/10/17 04/10/18  Cleotis Nipper, MD  Vitamin D, Ergocalciferol, (DRISDOL) 50000 units CAPS capsule Take 1 capsule (50,000 Units total) by mouth every 7 (seven) days. Patient not taking: Reported on 07/29/2017 02/28/16   Allie Bossier, MD     Objective:  EXAM:   Vitals:   07/29/17 0914  BP: 122/80  Pulse: 70  Resp: 16  Temp: 98.4 F (36.9 C)  TempSrc: Oral  SpO2: 100%  Weight: 244 lb 9.6 oz (110.9 kg)  Height:  (1.803 m)    General appearance : A&OX3. NAD. Non-toxic-appearing HEENT: Atraumatic and Normocephalic.  PERRLA. EOM intact.   Neck: supple, no JVD. No cervical lymphadenopathy. No thyromegaly Chest/Lungs:  Breathing-non-labored, Good air entry bilaterally, breath sounds normal without rales, rhonchi, or wheezing  CVS: S1 S2 regular, no murmurs, gallops, rubs  Extremities: Bilateral Lower Ext shows no edema, both legs are warm to touch with = pulse throughout Neurology:  CN II-XII grossly intact, Non focal.   Psych:  TP linear. J/I WNL. Normal speech. Appropriate eye contact and affect.  Skin:  No Rash  Data Review Lab Results  Component Value Date   HGBA1C 5.5 02/20/2017     Assessment & Plan   1. STD exposure - Urinalysis Dipstick - Urine cytology ancillary only - HIV antibody (with reflex) - RPR Will treat according to any findings.  2. Vaginal itching UA dip WNL.   - Urine cytology ancillary only - HIV antibody (with reflex) - RPR  3. Urinary frequency Increase water, urinate after sex, sexual lubricants such as astroglide, etc.   - Urine cytology ancillary  only - HIV antibody (with reflex) - RPR  Patient have been counseled extensively about nutrition and exercise  Return for keep 7/16 appt with Dr Alvis Lemmings.  The patient was given clear instructions to go to ER or return to medical center if symptoms don't improve, worsen or new problems develop. The patient verbalized understanding. The patient was told to call to get lab results if they haven't heard anything in the next week.     Georgian Co, PA-C [facility] [place], [place] [phone]   07/29/2017, 9:39 AM

## 2017-07-30 LAB — URINE CYTOLOGY ANCILLARY ONLY
Chlamydia: NEGATIVE
Neisseria Gonorrhea: NEGATIVE
Trichomonas: NEGATIVE

## 2017-07-30 LAB — HIV ANTIBODY (ROUTINE TESTING W REFLEX): HIV SCREEN 4TH GENERATION: NONREACTIVE

## 2017-07-30 LAB — RPR: RPR Ser Ql: NONREACTIVE

## 2017-08-01 LAB — URINE CYTOLOGY ANCILLARY ONLY: Candida vaginitis: NEGATIVE

## 2017-08-03 ENCOUNTER — Telehealth: Payer: Self-pay

## 2017-08-03 ENCOUNTER — Other Ambulatory Visit: Payer: Self-pay | Admitting: Physician Assistant

## 2017-08-03 MED ORDER — METRONIDAZOLE 500 MG PO TABS: 500.0000 mg | ORAL_TABLET | Freq: Two times a day (BID) | ORAL | 0 refills | Status: DC

## 2017-08-03 MED FILL — metroNIDAZOLE 500 MG TABS: 500 | 7 days supply | Qty: 14 | Fill #0

## 2017-08-03 NOTE — Telephone Encounter (Signed)
CMA called patient to inform on lab results and Rx.  Patient understood.  

## 2017-08-03 NOTE — Telephone Encounter (Signed)
-----   Message from Anders Simmonds, [place] sent at 08/03/2017  7:03 AM EDT ----- Please call patient.  Her STD testing was ll negative, but she did test positive for bacterial vaginosis which is NOT an STD.  I sent her a prescription to the pharmacy for this. Thanks,  Georgian Co, PA-C

## 2017-08-06 ENCOUNTER — Ambulatory Visit: Payer: Self-pay | Attending: Family Medicine | Admitting: Physician Assistant

## 2017-08-06 VITALS — BP 130/84 | HR 76 | Temp 98.7°F | Resp 16 | Ht 71.0 in | Wt 244.6 lb

## 2017-08-06 DIAGNOSIS — R062 Wheezing: Secondary | ICD-10-CM | POA: Insufficient documentation

## 2017-08-06 DIAGNOSIS — R0602 Shortness of breath: Secondary | ICD-10-CM | POA: Insufficient documentation

## 2017-08-06 DIAGNOSIS — J40 Bronchitis, not specified as acute or chronic: Secondary | ICD-10-CM | POA: Insufficient documentation

## 2017-08-06 DIAGNOSIS — I10 Essential (primary) hypertension: Secondary | ICD-10-CM | POA: Insufficient documentation

## 2017-08-06 DIAGNOSIS — M109 Gout, unspecified: Secondary | ICD-10-CM | POA: Insufficient documentation

## 2017-08-06 DIAGNOSIS — Z79899 Other long term (current) drug therapy: Secondary | ICD-10-CM | POA: Insufficient documentation

## 2017-08-06 DIAGNOSIS — F419 Anxiety disorder, unspecified: Secondary | ICD-10-CM | POA: Insufficient documentation

## 2017-08-06 DIAGNOSIS — R05 Cough: Secondary | ICD-10-CM | POA: Insufficient documentation

## 2017-08-06 DIAGNOSIS — F329 Major depressive disorder, single episode, unspecified: Secondary | ICD-10-CM | POA: Insufficient documentation

## 2017-08-06 MED ORDER — METHYLPREDNISOLONE SODIUM SUCC 125 MG IJ SOLR
125.0000 mg | Freq: Once | INTRAMUSCULAR | Status: AC
  Administered 2017-08-06: 125 mg via INTRAMUSCULAR

## 2017-08-06 MED ORDER — FLUCONAZOLE 150 MG PO TABS: 150.0000 mg | ORAL_TABLET | Freq: Once | ORAL | 0 refills | Status: AC

## 2017-08-06 MED ORDER — AZITHROMYCIN 250 MG PO TABS: ORAL_TABLET | ORAL | 0 refills | Status: DC

## 2017-08-06 MED ORDER — IPRATROPIUM-ALBUTEROL 0.5-2.5 (3) MG/3ML IN SOLN
3.0000 mL | Freq: Once | RESPIRATORY_TRACT | Status: AC
  Administered 2017-08-06: 3 mL via RESPIRATORY_TRACT

## 2017-08-06 MED ORDER — ALBUTEROL SULFATE HFA 108 (90 BASE) MCG/ACT IN AERS: 2.0000 | INHALATION_SPRAY | Freq: Four times a day (QID) | RESPIRATORY_TRACT | 0 refills | Status: DC | PRN

## 2017-08-06 NOTE — Progress Notes (Signed)
Pt. Is here for a cough and has it for a week.

## 2017-08-06 NOTE — Progress Notes (Signed)
Patient ID: Melissa Cooley, female   DOB: October 04, 1980, 37 y.o.   MRN: [id number]   Melissa Cooley, is a 37 y.o. female  [id number]  [id number]  DOB - [birth year]  Subjective:  Chief Complaint and HPI: Melissa Cooley is a 37 y.o. female here today for 8 day h/o cough, congestion, ST.  First 2 days she had fever.  + wheezing and SOB but no h/o asthma.  Poor sleep.  Her sister is getting married Saturday.    ROS:   Constitutional:  No f/c, No night sweats, No unexplained weight loss. EENT:  No vision changes, No blurry vision, No hearing changes. No mouth, throat, or ear problems.  Respiratory: + cough, + SOB Cardiac: No CP, no palpitations GI:  No abd pain, No N/V/D. GU: No Urinary s/sx Musculoskeletal: No joint pain Neuro: No headache, no dizziness, no motor weakness.  Skin: No rash Endocrine:  No polydipsia. No polyuria.  Psych: Denies SI/HI  No problems updated.  ALLERGIES: No Known Allergies  PAST MEDICAL HISTORY: Past Medical History:  Diagnosis Date  . Anxiety   . Depression   . Gout   . Hypertension     MEDICATIONS AT HOME: Prior to Admission medications   Medication Sig Start Date End Date Taking? Authorizing Provider  hydrOXYzine (ATARAX/VISTARIL) 10 MG tablet Take 1-2 tab daily as needed for anziety 06/30/17  Yes Newlin, Enobong, MD  losartan (COZAAR) 100 MG tablet Take 1 tablet (100 mg total) by mouth daily. 05/18/17  Yes Hoy Register, MD  metroNIDAZOLE (FLAGYL) 500 MG tablet Take 1 tablet (500 mg total) by mouth 2 (two) times daily. 08/03/17  Yes Makaia Rappa M, PA-C  pantoprazole (PROTONIX) 40 MG tablet Take 1 tablet (40 mg total) by mouth daily. 04/30/17  Yes Hoy Register, MD  albuterol (PROVENTIL HFA;VENTOLIN HFA) 108 (90 Base) MCG/ACT inhaler Inhale 2 puffs into the lungs every 6 (six) hours as needed for wheezing or shortness of breath. 08/06/17   Anders Simmonds, PA-C  azithromycin (ZITHROMAX) 250 MG tablet Take 2 today then 1 daily 08/06/17   Anders Simmonds, PA-C  cephALEXin (KEFLEX) 500 MG capsule Take 1 capsule (500 mg total) by mouth 2 (two) times daily. Patient not taking: Reported on 06/30/2017 05/11/17   Hoy Register, MD  fluconazole (DIFLUCAN) 150 MG tablet Take 1 tablet (150 mg total) by mouth once for 1 dose. If needed for yeast infection 08/06/17 08/06/17  Anders Simmonds, PA-C  ibuprofen (ADVIL,MOTRIN) 600 MG tablet Take 1 tablet (600 mg total) by mouth every 6 (six) hours as needed. Patient not taking: Reported on 06/30/2017 08/12/16   Harolyn Rutherford C, PA-C  QUEtiapine (SEROQUEL) 100 MG tablet Take 1 tablet (100 mg total) by mouth at bedtime. Patient not taking: Reported on 05/11/2017 04/10/17 04/10/18  Cleotis Nipper, MD  Vitamin D, Ergocalciferol, (DRISDOL) 50000 units CAPS capsule Take 1 capsule (50,000 Units total) by mouth every 7 (seven) days. Patient not taking: Reported on 07/29/2017 02/28/16   Allie Bossier, MD     Objective:  EXAM:   Vitals:   08/06/17 1636  BP: 130/84  Pulse: 76  Resp: 16  Temp: 98.7 F (37.1 C)  TempSrc: Oral  SpO2: 95%  Weight: 244 lb 9.6 oz (110.9 kg)  Height:  (1.803 m)    General appearance : A&OX3. NAD. Non-toxic-appearing HEENT: Atraumatic and Normocephalic.  PERRLA. EOM intact.  TM clear B. Mouth-MMM, post pharynx WNL w/o erythema, No PND. Neck: supple, no JVD. No  cervical lymphadenopathy. No thyromegaly Chest/Lungs:  Breathing-non-labored, fair air entry bilaterally, breath sounds normal without rales or rhonchi.  There is mild to moderate wheezing throughout CVS: S1 S2 regular, no murmurs, gallops, rubs  Extremities: Bilateral Lower Ext shows no edema, both legs are warm to touch with = pulse throughout Neurology:  CN II-XII grossly intact, Non focal.   Psych:  TP linear. J/I WNL. Normal speech. Appropriate eye contact and affect.  Skin:  No Rash  Data Review Lab Results  Component Value Date   HGBA1C 5.5 02/20/2017     Assessment & Plan   1. Bronchitis -  azithromycin (ZITHROMAX) 250 MG tablet; Take 2 today then 1 daily  Dispense: 6 tablet; Refill: 0  2. Wheezing - methylPREDNISolone sodium succinate (SOLU-MEDROL) 125 mg/2 mL injection 125 mg - albuterol (PROVENTIL HFA;VENTOLIN HFA) 108 (90 Base) MCG/ACT inhaler; Inhale 2 puffs into the lungs every 6 (six) hours as needed for wheezing or shortness of breath.  Dispense: 1 Inhaler; Refill: 0 - ipratropium-albuterol (DUONEB) 0.5-2.5 (3) MG/3ML nebulizer solution 3 mL-pulse ox up to 99% s/p treatment  Patient have been counseled extensively about nutrition and exercise  Return for keep July f/up appt..  The patient was given clear instructions to go to ER or return to medical center if symptoms don't improve, worsen or new problems develop. The patient verbalized understanding. The patient was told to call to get lab results if they haven't heard anything in the next week.     Georgian Co, PA-C [facility] [place], [place] [phone]   08/06/2017, 4:59 PM

## 2017-08-07 MED FILL — AZITHROMYCIN 250 MG TABLET: 250 | 5 days supply | Qty: 6 | Fill #0

## 2017-08-07 MED FILL — FLUCONAZOLE 150 MG TABS: 150 | 1 days supply | Qty: 1 | Fill #0

## 2017-08-07 MED FILL — !VENTOLIN HFA INHALER: 108 (90 BAS | 25 days supply | Qty: 18 | Fill #0

## 2017-08-12 MED FILL — LOSARTAN POTASSIUM 100 MG T: 100 | 30 days supply | Qty: 30 | Fill #3

## 2017-08-14 MED FILL — ?PANTOPRAZOLE SO DR 40MG TA: 40 | 30 days supply | Qty: 30 | Fill #3

## 2017-08-26 ENCOUNTER — Encounter: Payer: Self-pay | Admitting: Obstetrics & Gynecology

## 2017-08-26 ENCOUNTER — Other Ambulatory Visit (HOSPITAL_COMMUNITY)
Admission: RE | Admit: 2017-08-26 | Discharge: 2017-08-26 | Disposition: A | Discharge status: Home / Self Care | Payer: Medicaid Other | Source: Ambulatory Visit | Attending: Obstetrics & Gynecology | Admitting: Obstetrics & Gynecology

## 2017-08-26 ENCOUNTER — Ambulatory Visit (INDEPENDENT_AMBULATORY_CARE_PROVIDER_SITE_OTHER): Payer: Medicaid Other | Admitting: Obstetrics & Gynecology

## 2017-08-26 VITALS — BP 153/78 | HR 66 | Resp 16 | Wt 252.2 lb

## 2017-08-26 DIAGNOSIS — Z309 Encounter for contraceptive management, unspecified: Secondary | ICD-10-CM

## 2017-08-26 DIAGNOSIS — Z01419 Encounter for gynecological examination (general) (routine) without abnormal findings: Secondary | ICD-10-CM | POA: Diagnosis not present

## 2017-08-26 DIAGNOSIS — N643 Galactorrhea not associated with childbirth: Secondary | ICD-10-CM

## 2017-08-26 MED ORDER — LIDOCAINE HCL URETHRAL/MUCOSAL 2 % EX GEL: 1.0000 "application " | CUTANEOUS | 2 refills | Status: DC | PRN

## 2017-08-26 NOTE — Progress Notes (Signed)
Subjective:    Melissa Cooley is a 37 y.o. single P2 25(16 and 567 yo kids) female who presents for an annual exam. She mentions pain with sex, "on and off for years". No positional. Uses lubricant. Not a size issue. IBU doesn't help. Her mom's vicodin helps. She also has new onset galactorrhea. She has a h/o mastitis. The patient is sexually active. GYN screening history: last pap: was normal with + HR HPV.  The patient wears seatbelts: yes. The patient participates in regular exercise: no. Has the patient ever been transfused or tattooed?: yes. The patient reports that there is not domestic violence in her life.   Menstrual History: OB History    Gravida  5   Para  2   Term      Preterm      AB  3   Living  2     SAB      TAB  3   Ectopic      Multiple      Live Births  2           Menarche age: [age] Patient's last menstrual period was 08/14/2017 (exact date).    The following portions of the patient's history were reviewed and updated as appropriate: allergies, current medications, past family history, past medical history, past social history, past surgical history and problem list.  Review of Systems Pertinent items are noted in HPI.   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   Objective:    BP (!) 153/78 (BP Location: Left Arm, Cuff Size: Large)   Pulse 66   Resp 16   Wt 252 lb 3.2 oz (114.4 kg)   LMP 08/14/2017 (Exact Date)   BMI 35.17 kg/m   General Appearance:    Alert, cooperative, no distress, appears stated age  Head:    Normocephalic, without obvious abnormality, atraumatic  Eyes:    PERRL, conjunctiva/corneas clear, EOM's intact, fundi    benign, both eyes  Ears:    Normal TM's and external ear canals, both ears  Nose:   Nares normal, septum midline, mucosa normal, no drainage    or sinus tenderness  Throat:    Lips, mucosa, and tongue normal; teeth and gums normal  Neck:   Supple, symmetrical, trachea midline, no adenopathy;    thyroid:  no enlargement/tenderness/nodules; no carotid   bruit or JVD  Back:     Symmetric, no curvature, ROM normal, no CVA tenderness  Lungs:     Clear to auscultation bilaterally, respirations unlabored  Chest Wall:    No tenderness or deformity   Heart:    Regular rate and rhythm, S1 and S2 normal, no murmur, rub   or gallop  Breast Exam:    No tenderness, masses, or nipple abnormality  Abdomen:     Soft, non-tender, bowel sounds active all four quadrants,    no masses, no organomegaly  Genitalia:    Normal female without lesion, discharge or tenderness     Extremities:   Extremities normal, atraumatic, no cyanosis or edema  Pulses:   2+ and symmetric all extremities  Skin:   Skin color, texture, turgor normal, no rashes or lesions  Lymph nodes:   Cervical, supraclavicular, and axillary nodes normal  Neurologic:   CNII-XII intact, normal strength, sensation and reflexes    throughout  .  Assessment:    Healthy female exam.   Chronic pelvic pain and dyspareunia galactorrhea   Plan:     Chlamydia specimen. GC specimen. Thin prep Pap smear. with cotesting Cannot use OCPs due to HTN Offered dx laparoscopy, which she does want to have done I sent [place] an email to schedule this Check morning prolactin level

## 2017-08-27 ENCOUNTER — Other Ambulatory Visit: Payer: Self-pay

## 2017-08-27 ENCOUNTER — Other Ambulatory Visit: Payer: Medicaid Other

## 2017-08-27 DIAGNOSIS — G8929 Other chronic pain: Secondary | ICD-10-CM

## 2017-08-27 DIAGNOSIS — R102 Pelvic and perineal pain: Principal | ICD-10-CM

## 2017-08-28 LAB — PROLACTIN: PROLACTIN: 12.1 ng/mL (ref 4.8–23.3)

## 2017-08-31 LAB — CYTOLOGY - PAP
Chlamydia: NEGATIVE
DIAGNOSIS: NEGATIVE
HPV: NOT DETECTED
Neisseria Gonorrhea: NEGATIVE

## 2017-09-01 ENCOUNTER — Encounter (HOSPITAL_COMMUNITY): Payer: Self-pay

## 2017-09-11 ENCOUNTER — Other Ambulatory Visit: Payer: Self-pay | Admitting: Family Medicine

## 2017-09-11 DIAGNOSIS — K219 Gastro-esophageal reflux disease without esophagitis: Secondary | ICD-10-CM

## 2017-09-11 MED FILL — LOSARTAN POTASSIUM 100 MG T: 100 | 30 days supply | Qty: 30 | Fill #0

## 2017-09-14 MED FILL — PANTOPRAZOLE SOD DR 40 MG T: 40 | 30 days supply | Qty: 30 | Fill #0

## 2017-09-29 ENCOUNTER — Ambulatory Visit: Payer: Self-pay | Admitting: Family Medicine

## 2017-10-15 MED FILL — PANTOPRAZOLE SOD DR 40 MG T: 40 | 30 days supply | Qty: 30 | Fill #1

## 2017-10-15 MED FILL — LOSARTAN POTASSIUM 100 MG T: 100 | 30 days supply | Qty: 30 | Fill #1

## 2017-10-26 ENCOUNTER — Ambulatory Visit: Payer: Self-pay | Attending: Family Medicine | Admitting: Family Medicine

## 2017-10-26 ENCOUNTER — Encounter: Payer: Self-pay | Admitting: Family Medicine

## 2017-10-26 DIAGNOSIS — K589 Irritable bowel syndrome without diarrhea: Secondary | ICD-10-CM | POA: Insufficient documentation

## 2017-10-26 DIAGNOSIS — Z79899 Other long term (current) drug therapy: Secondary | ICD-10-CM | POA: Insufficient documentation

## 2017-10-26 DIAGNOSIS — Z9049 Acquired absence of other specified parts of digestive tract: Secondary | ICD-10-CM | POA: Insufficient documentation

## 2017-10-26 DIAGNOSIS — F411 Generalized anxiety disorder: Secondary | ICD-10-CM | POA: Insufficient documentation

## 2017-10-26 DIAGNOSIS — F319 Bipolar disorder, unspecified: Secondary | ICD-10-CM | POA: Insufficient documentation

## 2017-10-26 DIAGNOSIS — K219 Gastro-esophageal reflux disease without esophagitis: Secondary | ICD-10-CM | POA: Insufficient documentation

## 2017-10-26 DIAGNOSIS — I10 Essential (primary) hypertension: Secondary | ICD-10-CM | POA: Insufficient documentation

## 2017-10-26 MED ORDER — QUETIAPINE FUMARATE 50 MG PO TABS: 50.0000 mg | ORAL_TABLET | Freq: Every day | ORAL | 3 refills | Status: DC

## 2017-10-26 MED ORDER — PANTOPRAZOLE SODIUM 40 MG PO TBEC: 40.0000 mg | DELAYED_RELEASE_TABLET | Freq: Every day | ORAL | 3 refills | Status: DC

## 2017-10-26 MED ORDER — LOSARTAN POTASSIUM 100 MG PO TABS: 100.0000 mg | ORAL_TABLET | Freq: Every day | ORAL | 6 refills | Status: DC

## 2017-10-26 MED ORDER — HYDROXYZINE HCL 10 MG PO TABS: ORAL_TABLET | ORAL | 0 refills | Status: DC

## 2017-10-26 MED FILL — hydrOXYzine HCL 10 MG TABS: 10 | 30 days supply | Qty: 60 | Fill #0

## 2017-10-26 MED FILL — QUETIAPINE FUMARATE 50 MG T: 50 | 30 days supply | Qty: 30 | Fill #0

## 2017-10-26 NOTE — Progress Notes (Signed)
Subjective:  Patient ID: Melissa Cooley, female    DOB: 1980/11/13  Age: 37 y.o. MRN: [id number]  CC: Hypertension   HPI Melissa Cooley  is a 37 year old female with a history of hypertension, GERD, IBS, bipolar disorder here for follow-up visit. Her Bipolar disorder is managed by psychiatry with her last visit in 03/2017 but she states she no longer wants to follow-up with psychiatry due to difficulty making psych and therapy appointments coupled with her primary care visits especially with a tight work schedule.  She was prescribed 100 mg of Seroquel which made her overly sedated causing her to be late for work but reports doing well on 50 mg and also takes hydroxyzine as needed. Her reflux symptoms have been controlled and so has had IBS and she is doing well on her antihypertensive. Denies chest pains or shortness of breath.  Past Medical History:  Diagnosis Date  . Anxiety   . Depression   . Gout   . Hypertension     Past Surgical History:  Procedure Laterality Date  . CESAREAN SECTION    . CESAREAN SECTION    . CHOLECYSTECTOMY    . TUBAL LIGATION      No Known Allergies   Outpatient Medications Prior to Visit  Medication Sig Dispense Refill  . hydrOXYzine (ATARAX/VISTARIL) 10 MG tablet Take 1-2 tab daily as needed for anziety 60 tablet 0  . losartan (COZAAR) 100 MG tablet Take 1 tablet (100 mg total) by mouth daily. 30 tablet 3  . pantoprazole (PROTONIX) 40 MG tablet Take 1 tablet (40 mg total) by mouth daily. 30 tablet 3  . pantoprazole (PROTONIX) 40 MG tablet TAKE 1 TABLET (40 MG TOTAL) BY MOUTH DAILY. 30 tablet 3  . albuterol (PROVENTIL HFA;VENTOLIN HFA) 108 (90 Base) MCG/ACT inhaler Inhale 2 puffs into the lungs every 6 (six) hours as needed for wheezing or shortness of breath. (Patient not taking: Reported on 10/26/2017) 1 Inhaler 0  . lidocaine (XYLOCAINE) 2 % jelly Apply 1 application topically as needed. (Patient not taking: Reported on 10/26/2017) 30 mL 2  . Vitamin D,  Ergocalciferol, (DRISDOL) 50000 units CAPS capsule Take 1 capsule (50,000 Units total) by mouth every 7 (seven) days. (Patient not taking: Reported on 10/26/2017) 8 capsule 0  . QUEtiapine (SEROQUEL) 100 MG tablet Take 1 tablet (100 mg total) by mouth at bedtime. (Patient not taking: Reported on 05/11/2017) 30 tablet 0   No facility-administered medications prior to visit.     ROS Review of Systems  Constitutional: Negative for activity change, appetite change and fatigue.  HENT: Negative for congestion, sinus pressure and sore throat.   Eyes: Negative for visual disturbance.  Respiratory: Negative for cough, chest tightness, shortness of breath and wheezing.   Cardiovascular: Negative for chest pain and palpitations.  Gastrointestinal: Negative for abdominal distention, abdominal pain and constipation.  Endocrine: Negative for polydipsia.  Genitourinary: Negative for dysuria and frequency.  Musculoskeletal: Negative for arthralgias and back pain.  Skin: Negative for rash.  Neurological: Negative for tremors, light-headedness and numbness.  Hematological: Does not bruise/bleed easily.  Psychiatric/Behavioral: Negative for agitation and behavioral problems.    Objective:  BP 129/86   Pulse 68   Temp 98.1 F (36.7 C) (Oral)   Ht _0  (1.803 m)   Wt 255 lb 9.6 oz (115.9 kg)   SpO2 100%   BMI 35.65 kg/m   BP/Weight 10/26/2017 08/26/2017 06/30/6061  Systolic BP 016 010 932  Diastolic BP 86 78 84  Wt. (  Lbs) 255.6 252.2 244.6  BMI 35.65 35.17 34.11      Physical Exam  Constitutional: She is oriented to person, place, and time. She appears well-developed and well-nourished.  Cardiovascular: Normal rate, normal heart sounds and intact distal pulses.  No murmur heard. Pulmonary/Chest: Effort normal and breath sounds normal. She has no wheezes. She has no rales. She exhibits no tenderness.  Abdominal: Soft. Bowel sounds are normal. She exhibits no distension and no mass. There is no  tenderness.  Musculoskeletal: Normal range of motion.  Neurological: She is alert and oriented to person, place, and time.  Skin: Skin is warm and dry.  Psychiatric: She has a normal mood and affect.     CMP Latest Ref Rng & Units 02/20/2017 02/25/2016 01/03/2016  Glucose 65 - 99 mg/dL 82 90 92  BUN 6 - 20 mg/dL _0 Creatinine 0.57 - 1.00 mg/dL 0.67 0.56 0.65  Sodium 134 - 144 mmol/L 136 138 137  Potassium 3.5 - 5.2 mmol/L 4.0 4.7 4.0  Chloride 96 - 106 mmol/L 104 106 109  CO2 20 - 29 mmol/L 19(L) 23 22  Calcium 8.7 - 10.2 mg/dL 9.0 9.0 9.3  Total Protein 6.0 - 8.5 g/dL 7.4 6.7 -  Total Bilirubin 0.0 - 1.2 mg/dL 0.6 0.6 -  Alkaline Phos 39 - 117 IU/L 55 45 -  AST 0 - 40 IU/L 23 17 -  ALT 0 - 32 IU/L 20 14 -    Lipid Panel     Component Value Date/Time   CHOL 114 02/25/2016 1026   TRIG 87 02/25/2016 1026   HDL 41 (L) 02/25/2016 1026   CHOLHDL 2.8 02/25/2016 1026   VLDL 17 02/25/2016 1026   LDLCALC 56 02/25/2016 1026    Assessment & Plan:   1. Bipolar I disorder ([place]) Stable I have reduced her dose from 100 mg to 50 mg due to intolerance of 100 mg - QUEtiapine (SEROQUEL) 50 MG tablet; Take 1 tablet (50 mg total) by mouth at bedtime.  Dispense: 30 tablet; Refill: 3  2. Essential hypertension Controlled Counseled on blood pressure goal of less than 130/80, low-sodium, DASH diet, medication compliance, 150 minutes of moderate intensity exercise per week. Discussed medication compliance, adverse effects. - losartan (COZAAR) 100 MG tablet; Take 1 tablet (100 mg total) by mouth daily.  Dispense: 30 tablet; Refill: 6 - Lipid panel - CMP14+EGFR  3. Generalized anxiety disorder Stable - hydrOXYzine (ATARAX/VISTARIL) 10 MG tablet; Take 1-2 tab daily as needed for anziety  Dispense: 60 tablet; Refill: 0  4. Gastroesophageal reflux disease without esophagitis Controlled - pantoprazole (PROTONIX) 40 MG tablet; Take 1 tablet (40 mg total) by mouth daily.  Dispense: 30  tablet; Refill: 3   Meds ordered this encounter  Medications  . QUEtiapine (SEROQUEL) 50 MG tablet    Sig: Take 1 tablet (50 mg total) by mouth at bedtime.    Dispense:  30 tablet    Refill:  3  . losartan (COZAAR) 100 MG tablet    Sig: Take 1 tablet (100 mg total) by mouth daily.    Dispense:  30 tablet    Refill:  6    Discontinue 63m  . hydrOXYzine (ATARAX/VISTARIL) 10 MG tablet    Sig: Take 1-2 tab daily as needed for anziety    Dispense:  60 tablet    Refill:  0  . pantoprazole (PROTONIX) 40 MG tablet    Sig: Take 1 tablet (40 mg total) by mouth daily.  Dispense:  30 tablet    Refill:  3    Follow-up: Return in about 3 months (around 01/26/2018) for Follow-up of chronic medical conditions.   Charlott Rakes MD

## 2017-10-26 NOTE — Patient Instructions (Signed)
Bipolar 1 Disorder Bipolar 1 disorder is a mental health disorder in which a person has episodes of emotional highs (mania), and may also have episodes of emotional lows (depression) in addition to highs. Bipolar 1 disorder is different from other bipolar disorders because it involves extreme manic episodes. These episodes last at least one week or involve symptoms that are so severe that hospitalization is needed to keep the person safe. What increases the risk? The cause of this condition is not known. However, certain factors make you more likely to have bipolar disorder, such as:  Having a family member with the disorder.  An imbalance of certain chemicals in the brain (neurotransmitters).  Stress, such as illness, financial problems, or a death.  Certain conditions that affect the brain or spinal cord (neurologic conditions).  Brain injury (trauma).  Having another mental health disorder, such as: ? Obsessive compulsive disorder. ? Schizophrenia.  What are the signs or symptoms? Symptoms of mania include:  Very high self-esteem or self-confidence.  Decreased need for sleep.  Unusual talkativeness or feeling a need to keep talking. Speech may be very fast. It may seem like you cannot stop talking.  Racing thoughts or constant talking, with quick shifts between topics that may or may not be related (flight of ideas).  Decreased ability to focus or concentrate.  Increased purposeful activity, such as work, studies, or social activity.  Increased nonproductive activity. This could be pacing, squirming and fidgeting, or finger and toe tapping.  Impulsive behavior and poor judgment. This may result in high-risk activities, such as having unprotected sex or spending a lot of money.  Symptoms of depression include:  Feeling sad, hopeless, or helpless.  Frequent or uncontrollable crying.  Lack of feeling or caring about anything.  Sleeping too much.  Moving more slowly  than usual.  Not being able to enjoy things you used to enjoy.  Wanting to be alone all the time.  Feeling guilty or worthless.  Lack of energy or motivation.  Trouble concentrating or remembering.  Trouble making decisions.  Increased appetite.  Thoughts of death, or the desire to harm yourself.  Sometimes, you may have a mixed mood. This means having symptoms of depression and mania. Stress can make symptoms worse. How is this diagnosed? To diagnose bipolar disorder, your health care provider may ask about your:  Emotional episodes.  Medical history.  Alcohol and drug use. This includes prescription medicines. Certain medical conditions and substances can cause symptoms that seem like bipolar disorder (secondary bipolar disorder).  How is this treated? Bipolar disorder is a long-term (chronic) illness. It is best controlled with ongoing (continuous) treatment rather than treatment only when symptoms occur. Treatment may include:  Medicine. Medicine can be prescribed by a provider who specializes in treating mental disorders (psychiatrist). ? Medicines called mood stabilizers are usually prescribed. ? If symptoms occur even while taking a mood stabilizer, other medicines may be added.  Psychotherapy. Some forms of talk therapy, such as cognitive-behavioral therapy (CBT), can provide support, education, and guidance.  Coping methods, such as journaling or relaxation exercises. These may include: ? Yoga. ? Meditation. ? Deep breathing.  Lifestyle changes, such as: ? Limiting alcohol and drug use. ? Exercising regularly. ? Getting plenty of sleep. ? Making healthy eating choices.  A combination of medicine, talk therapy, and coping methods is best. A procedure in which electricity is applied to the brain through the scalp (electroconvulsive therapy) may be used in cases of severe mania when medicine   and psychotherapy work too slowly or do not work. Follow these  instructions at home: Activity   Return to your normal activities as told by your health care provider.  Find activities that you enjoy, and make time to do them.  Exercise regularly as told by your health care provider. Lifestyle  Limit alcohol intake to no more than 1 drink a day for nonpregnant women and 2 drinks a day for men. One drink equals 12 oz of beer, 5 oz of wine, or 1 oz of hard liquor.  Follow a set schedule for eating and sleeping.  Eat a balanced diet that includes fresh fruits and vegetables, whole grains, low-fat dairy, and lean meat.  Get 7-8 hours of sleep each night. General instructions  Take over-the-counter and prescription medicines only as told by your health care provider.  Think about joining a support group. Your health care provider may be able to recommend a support group.  Talk with your family and loved ones about your treatment goals and how they can help.  Keep all follow-up visits as told by your health care provider. This is important. Where to find more information: For more information about bipolar disorder, visit the following websites:  National Alliance on Mental Illness: www.nami.org  U.[place]. National Institute of Mental Health: www.nimh.nih.gov  Contact a health care provider if:  Your symptoms get worse.  You have side effects from your medicine, and they get worse.  You have trouble sleeping.  You have trouble doing daily activities.  You feel unsafe in your surroundings.  You are dealing with substance abuse. Get help right away if:  You have new symptoms.  You have thoughts about harming yourself.  You self-harm. This information is not intended to replace advice given to you by your health care provider. Make sure you discuss any questions you have with your health care provider. Document Released: 06/09/2000 Document Revised: 10/28/2015 Document Reviewed: 11/01/2015 Elsevier Interactive Patient Education  2018  Elsevier Inc.  

## 2017-10-27 LAB — LIPID PANEL
CHOLESTEROL TOTAL: 124 mg/dL (ref 100–199)
Chol/HDL Ratio: 3.6 ratio (ref 0.0–4.4)
HDL: 34 mg/dL — AB (ref >39)
LDL Calculated: 66 mg/dL (ref 0–99)
TRIGLYCERIDES: 120 mg/dL (ref 0–149)
VLDL Cholesterol Cal: 24 mg/dL (ref 5–40)

## 2017-10-27 LAB — CMP14+EGFR
A/G RATIO: 1.5 (ref 1.2–2.2)
ALBUMIN: 4.1 g/dL (ref 3.5–5.5)
ALT: 14 IU/L (ref 0–32)
AST: 16 IU/L (ref 0–40)
Alkaline Phosphatase: 60 IU/L (ref 39–117)
BILIRUBIN TOTAL: 0.3 mg/dL (ref 0.0–1.2)
BUN / CREAT RATIO: 12 (ref 9–23)
BUN: 9 mg/dL (ref 6–20)
CHLORIDE: 107 mmol/L — AB (ref 96–106)
CO2: 18 mmol/L — ABNORMAL LOW (ref 20–29)
Calcium: 8.9 mg/dL (ref 8.7–10.2)
Creatinine, Ser: 0.74 mg/dL (ref 0.57–1.00)
GFR calc non Af Amer: 105 mL/min/{1.73_m2} (ref >59)
GFR, EST AFRICAN AMERICAN: 121 mL/min/{1.73_m2} (ref >59)
GLOBULIN, TOTAL: 2.8 g/dL (ref 1.5–4.5)
Glucose: 98 mg/dL (ref 65–99)
POTASSIUM: 3.8 mmol/L (ref 3.5–5.2)
Sodium: 139 mmol/L (ref 134–144)
TOTAL PROTEIN: 6.9 g/dL (ref 6.0–8.5)

## 2017-10-28 ENCOUNTER — Emergency Department (HOSPITAL_COMMUNITY)
Admission: EM | Admit: 2017-10-28 | Discharge: 2017-10-28 | Disposition: A | Discharge status: Home / Self Care | Payer: Medicaid Other | Attending: Emergency Medicine | Admitting: Emergency Medicine

## 2017-10-28 ENCOUNTER — Emergency Department (HOSPITAL_COMMUNITY): Payer: Medicaid Other

## 2017-10-28 ENCOUNTER — Encounter (HOSPITAL_COMMUNITY): Payer: Self-pay | Admitting: Emergency Medicine

## 2017-10-28 ENCOUNTER — Other Ambulatory Visit: Payer: Self-pay

## 2017-10-28 DIAGNOSIS — G44209 Tension-type headache, unspecified, not intractable: Secondary | ICD-10-CM

## 2017-10-28 DIAGNOSIS — G44219 Episodic tension-type headache, not intractable: Secondary | ICD-10-CM | POA: Insufficient documentation

## 2017-10-28 DIAGNOSIS — Z87891 Personal history of nicotine dependence: Secondary | ICD-10-CM | POA: Insufficient documentation

## 2017-10-28 DIAGNOSIS — Z79899 Other long term (current) drug therapy: Secondary | ICD-10-CM | POA: Insufficient documentation

## 2017-10-28 DIAGNOSIS — I10 Essential (primary) hypertension: Secondary | ICD-10-CM | POA: Insufficient documentation

## 2017-10-28 LAB — BASIC METABOLIC PANEL
ANION GAP: 9 (ref 5–15)
BUN: 15 mg/dL (ref 6–20)
CHLORIDE: 107 mmol/L (ref 98–111)
CO2: 22 mmol/L (ref 22–32)
Calcium: 9.1 mg/dL (ref 8.9–10.3)
Creatinine, Ser: 0.7 mg/dL (ref 0.44–1.00)
GFR calc Af Amer: >60 mL/min (ref >60)
GLUCOSE: 85 mg/dL (ref 70–99)
POTASSIUM: 3.7 mmol/L (ref 3.5–5.1)
Sodium: 138 mmol/L (ref 135–145)

## 2017-10-28 LAB — CBC
HEMATOCRIT: 41.6 % (ref 36.0–46.0)
HEMOGLOBIN: 13.6 g/dL (ref 12.0–15.0)
MCH: 30.9 pg (ref 26.0–34.0)
MCHC: 32.7 g/dL (ref 30.0–36.0)
MCV: 94.5 fL (ref 78.0–100.0)
Platelets: 359 10*3/uL (ref 150–400)
RBC: 4.4 MIL/uL (ref 3.87–5.11)
RDW: 13.2 % (ref 11.5–15.5)
WBC: 8.6 10*3/uL (ref 4.0–10.5)

## 2017-10-28 LAB — URINALYSIS, ROUTINE W REFLEX MICROSCOPIC
BILIRUBIN URINE: NEGATIVE
Bacteria, UA: NONE SEEN
Glucose, UA: NEGATIVE mg/dL
KETONES UR: NEGATIVE mg/dL
Leukocytes, UA: NEGATIVE
NITRITE: NEGATIVE
Protein, ur: NEGATIVE mg/dL
SPECIFIC GRAVITY, URINE: 1.008 (ref 1.005–1.030)
pH: 6 (ref 5.0–8.0)

## 2017-10-28 LAB — I-STAT BETA HCG BLOOD, ED (MC, WL, AP ONLY): I-stat hCG, quantitative: <5 m[IU]/mL (ref <5)

## 2017-10-28 LAB — CBG MONITORING, ED: Glucose-Capillary: 83 mg/dL (ref 70–99)

## 2017-10-28 MED ORDER — KETOROLAC TROMETHAMINE 30 MG/ML IJ SOLN
30.0000 mg | Freq: Once | INTRAMUSCULAR | Status: AC
  Administered 2017-10-28: 30 mg via INTRAVENOUS
  Filled 2017-10-28: qty 1

## 2017-10-28 MED ORDER — HYDROCHLOROTHIAZIDE 12.5 MG PO TABS: ORAL_TABLET | ORAL | 1 refills | Status: DC

## 2017-10-28 NOTE — ED Triage Notes (Signed)
Pt reports checking BP today and it was 180/110 and 190/125. Pt reports 10/10 worst headache ever, seeing spots, nausea and tingling to fingers. Hx of HTN and is compliant with medications.

## 2017-10-28 NOTE — Discharge Instructions (Signed)
See your Physician for recheck.  °

## 2017-10-28 NOTE — ED Provider Notes (Signed)
Patient placed in Quick Look pathway, seen and evaluated   Chief Complaint: headache  HPI:  Melissa Cooley is a 37 y.o. female who presents to the ED with headache. Patient reports she was at work and felt bad and checked her BP and it was 190/110. Patient reports the worse headache of her life. She c/o nausea and her fingers feel numb. Initially the symptoms were just on the right side but now she feels headache all over and numbness to both hands.   ROS: Neuro: headache, numbness and tingling  GI: nausea  Physical Exam:  BP (!) 171/99 (BP Location: Right Arm)   Pulse 63   Temp 99 F (37.2 C) (Oral)   Resp 20   Ht 5\' 11"  (1.803 m)   Wt 115.7 kg   LMP 10/21/2017   SpO2 100%   BMI 35.57 kg/m    Gen: No distress  Neuro: Awake and Alert, grips are equal, no pronator drift, straight leg raises without difficulty  Skin: Warm and dry  Heart: regular rate and rhythm  Lungs: clear   Eyes: good occular movement  Initiation of care has begun. The patient has been counseled on the process, plan, and necessity for staying for the completion/evaluation, and the remainder of the medical screening examination    Janne Napoleoneese, Melissa Cooley M, NP 10/28/17 1743    Gerhard MunchLockwood, Robert, MD 10/28/17 [phone]

## 2017-10-28 NOTE — ED Provider Notes (Addendum)
[facility] EMERGENCY DEPARTMENT Provider Note   CSN: [id number] Arrival date & time: 10/28/17  1648     History   Chief Complaint Chief Complaint  Patient presents with  . Hypertension  . Headache    HPI Melissa Cooley is a 37 y.o. female.  The history is provided by the patient. No language interpreter was used.  Hypertension  This is a new problem. The problem occurs constantly. The problem has been gradually worsening. Associated symptoms include headaches. Nothing aggravates the symptoms. Nothing relieves the symptoms. She has tried nothing for the symptoms. The treatment provided no relief.  Headache   This is a new problem. The current episode started 3 to 5 hours ago. The problem occurs constantly. The problem has not changed since onset.The headache is associated with nothing.   Pt reports she has had a headache today.  Pt checked her blood pressure and it was 190/110 Past Medical History:  Diagnosis Date  . Anxiety   . Depression   . Gout   . Hypertension     Patient Active Problem List   Diagnosis Date Noted  . Family history of breast cancer in mother 05/11/2017  . Obese 04/03/2017  . Hypertension 02/20/2017  . Irritable bowel syndrome 02/20/2017  . GERD (gastroesophageal reflux disease) 02/20/2017    Past Surgical History:  Procedure Laterality Date  . CESAREAN SECTION    . CESAREAN SECTION    . CHOLECYSTECTOMY    . TUBAL LIGATION       OB History    Gravida  5   Para  2   Term      Preterm      AB  3   Living  2     SAB      TAB  3   Ectopic      Multiple      Live Births  2            Home Medications    Prior to Admission medications   Medication Sig Start Date End Date Taking? Authorizing Provider  acetaminophen (TYLENOL) 500 MG tablet Take 1,500 mg by mouth every 6 (six) hours as needed for mild pain or headache.   Yes [provider]  Cholecalciferol (VITAMIN D PO) Take 1 capsule by mouth  daily.   Yes [provider]  ibuprofen (ADVIL,MOTRIN) 200 MG tablet Take 800 mg by mouth every 6 (six) hours as needed for fever, headache or moderate pain.   Yes [provider]  losartan (COZAAR) 100 MG tablet Take 1 tablet (100 mg total) by mouth daily. 10/26/17  Yes Hoy Register, MD  pantoprazole (PROTONIX) 40 MG tablet Take 1 tablet (40 mg total) by mouth daily. 10/26/17  Yes Hoy Register, MD  PSYLLIUM PO Take 1 capsule by mouth daily.   Yes [provider]  albuterol (PROVENTIL HFA;VENTOLIN HFA) 108 (90 Base) MCG/ACT inhaler Inhale 2 puffs into the lungs every 6 (six) hours as needed for wheezing or shortness of breath. Patient not taking: Reported on 10/26/2017 08/06/17   Anders Simmonds, PA-C  hydrochlorothiazide (HYDRODIURIL) 12.5 MG tablet One tablet a day 10/28/17   Cheron Schaumann K, PA-C  hydrOXYzine (ATARAX/VISTARIL) 10 MG tablet Take 1-2 tab daily as needed for anziety Patient not taking: Reported on 10/28/2017 10/26/17   Hoy Register, MD  lidocaine (XYLOCAINE) 2 % jelly Apply 1 application topically as needed. Patient not taking: Reported on 10/26/2017 08/26/17   Allie Bossier, MD  QUEtiapine (SEROQUEL) 50 MG tablet Take 1 tablet (50 mg total) by mouth at bedtime. Patient not taking: Reported on 10/28/2017 10/26/17 10/26/18  Hoy RegisterNewlin, Enobong, MD  Vitamin D, Ergocalciferol, (DRISDOL) 50000 units CAPS capsule Take 1 capsule (50,000 Units total) by mouth every 7 (seven) days. Patient not taking: Reported on 10/26/2017 02/28/16   Allie Bossierove, Myra C, MD    Family History Family History  Problem Relation Age of Onset  . Cancer Mother   . Drug abuse Mother   . Bipolar disorder Mother   . Diabetes Father   . Hypertension Other   . Anxiety disorder Sister   . Drug abuse Brother     Social History Social History   Tobacco Use  . Smoking status: Former Games developermoker  . Smokeless tobacco: Never Used  Substance Use Topics  . Alcohol use: No  . Drug use: No      Allergies   Patient has no known allergies.   Review of Systems Review of Systems  Neurological: Positive for headaches.  All other systems reviewed and are negative.    Physical Exam Updated Vital Signs BP 134/68   Pulse 61   Temp 99 F (37.2 C) (Oral)   Resp 14   Ht 5\' 11"  (1.803 m)   Wt 115.7 kg   LMP 10/21/2017   SpO2 100%   BMI 35.57 kg/m   Physical Exam  Constitutional: She appears well-developed and well-nourished.  HENT:  Head: Normocephalic.  Eyes: Pupils are equal, round, and reactive to light. EOM are normal.  Neck: Normal range of motion.  Cardiovascular: Normal rate.  Pulmonary/Chest: Effort normal.  Abdominal: Soft.  Neurological: She is alert. She has normal strength.  Skin: Skin is warm.  Psychiatric: She has a normal mood and affect.  Nursing note and vitals reviewed.    ED Treatments / Results  Labs (all labs ordered are listed, but only abnormal results are displayed) Labs Reviewed  URINALYSIS, ROUTINE W REFLEX MICROSCOPIC - Abnormal; Notable for the following components:      Result Value   Color, Urine STRAW (*)    Hgb urine dipstick MODERATE (*)    All other components within normal limits  BASIC METABOLIC PANEL  CBC  CBG MONITORING, ED  I-STAT BETA HCG BLOOD, ED ([facility], [facility], [facility] ONLY)    EKG None  Radiology Ct Head Wo Contrast  Result Date: 10/28/2017 CLINICAL DATA:  Pt c/o unusually HBP, HA with increased pain behind orbits. H/O HTN, depression, anxiety. EXAM: CT HEAD WITHOUT CONTRAST TECHNIQUE: Contiguous axial images were obtained from the base of the skull through the vertex without intravenous contrast. COMPARISON:  None. FINDINGS: Brain: No evidence of acute infarction, hemorrhage, hydrocephalus, extra-axial collection or mass lesion/mass effect. Vascular: No hyperdense vessel or unexpected calcification. Skull: Normal. Negative for fracture or focal lesion. Sinuses/Orbits: No acute finding. Other: None. IMPRESSION:  Negative Electronically Signed   By: Corlis Leak  Hassell M.D.   On: 10/28/2017 19:16    Procedures Procedures (including critical care time)  Medications Ordered in ED Medications  ketorolac (TORADOL) 30 MG/ML injection 30 mg (30 mg Intravenous Given 10/28/17 1939)   EKG: :"normal EKG, normal sinus rhythm","unchanged from previous tracings"}.rate 66, pr 170, QRS 74 normal qt of 406, no st cahnges  Initial Impression / Assessment and Plan / ED Course  I have reviewed the triage vital signs and the nursing notes.  Pertinent labs & imaging results that were available during my care of the patient were reviewed by me and  considered in my medical decision making (see chart for details).    Pt given torodol Iv.  Pt reports her headache resolved.  Pt given rx for hctz.  Pt advised to start tomorrow.  Schedule appointment for follow up    Final Clinical Impressions(s) / ED Diagnoses   Final diagnoses:  Acute non intractable tension-type headache  Hypertension, unspecified type    ED Discharge Orders         Ordered    hydrochlorothiazide (HYDRODIURIL) 12.5 MG tablet     10/28/17 2114        An After Visit Summary was printed and given to the patient.   Elson AreasSofia, Antwuan Eckley K, PA-C 10/28/17 2123    Elson AreasSofia, Shaunita Seney K, PA-C 10/28/17 2125    Mancel BaleWentz, Elliott, MD 10/29/17 0150    Elson AreasSofia, Keighan Amezcua K, PA-C 11/27/17 1445    Mancel BaleWentz, Elliott, MD 11/27/17 [phone]

## 2017-10-28 NOTE — ED Notes (Signed)
Pt CBG was 83, notified Sheyla(RN)

## 2017-10-29 MED FILL — HYDROCHLOROTHIAZIDE 12.5 MG: 12.5 | 30 days supply | Qty: 30 | Fill #0

## 2017-10-30 ENCOUNTER — Telehealth: Payer: Self-pay

## 2017-10-30 ENCOUNTER — Encounter: Payer: Self-pay | Admitting: Pharmacist

## 2017-10-30 ENCOUNTER — Ambulatory Visit: Payer: Self-pay | Attending: Family Medicine | Admitting: Pharmacist

## 2017-10-30 VITALS — BP 116/80 | HR 89

## 2017-10-30 DIAGNOSIS — I10 Essential (primary) hypertension: Secondary | ICD-10-CM | POA: Insufficient documentation

## 2017-10-30 DIAGNOSIS — K589 Irritable bowel syndrome without diarrhea: Secondary | ICD-10-CM | POA: Insufficient documentation

## 2017-10-30 DIAGNOSIS — K219 Gastro-esophageal reflux disease without esophagitis: Secondary | ICD-10-CM | POA: Insufficient documentation

## 2017-10-30 DIAGNOSIS — F319 Bipolar disorder, unspecified: Secondary | ICD-10-CM | POA: Insufficient documentation

## 2017-10-30 NOTE — Progress Notes (Signed)
   S:    PCP: Newlin  Pt is a 37 YO female with PMH of HTN, GERD, IBS, and Bipolar disorder who is here today for a BP check. She was referred on 10/26/17 by Dr. Alvis LemmingsNewlin. BP at that visit was 129/86. Of note, pt recently presented to the ED 10/28/17 w/ tension HA. BP of 134/68. She was discharged with HCTZ 12.5 mg daily. Losartan 100 mg daily continued.   Patient arrives in good spirits.  Today, she denies CP, SOB. She does report a "little" headache. Patient reports adherence with medications.  10/28/17 - ED w/ tension HA. BP of 134/68. Discharged with HCTZ 12.5 mg.   Current BP Medications include:   - losartan 100 mg daily - HCTZ 12.5 mg daily  Dietary habits include:  - Pt limits salts - Tries to incorporate more fresh fruits/vegetables Exercise habits include: - Pt does not currently exercise Family / Social history:  - FH: DM (father) - Tobacco: never smoker - Alcohol: denies use  Home BP readings:  - Reports that she is going to buy a BP cuff after appointment today  O:  BP in clinic taken in R arm after 5 minutes rest: 116/80; HR of 89  Last 3 Office BP readings: BP Readings from Last 3 Encounters:  10/28/17 (!) 160/108  10/26/17 129/86  08/26/17 (!) 153/78    BMET    Component Value Date/Time   NA 138 10/28/2017 1750   NA 139 10/26/2017 1003   K 3.7 10/28/2017 1750   CL 107 10/28/2017 1750   CO2 22 10/28/2017 1750   GLUCOSE 85 10/28/2017 1750   BUN 15 10/28/2017 1750   BUN 9 10/26/2017 1003   CREATININE 0.70 10/28/2017 1750   CREATININE 0.56 02/25/2016 1026   CALCIUM 9.1 10/28/2017 1750   GFRNONAA >60 10/28/2017 1750   GFRAA >60 10/28/2017 1750    Renal function: Estimated Creatinine Clearance: 136.3 mL/min (by C-G formula based on SCr of 0.7 mg/dL).  A/P: Hypertension longstanding currently controlled on current medications. BP Goal <130/80 mmHg. Patient is adherent with current medications.  -Continued anti-hypertensives.  -Counseled on lifestyle  modifications for blood pressure control including reduced dietary sodium, increased exercise, adequate sleep  Results reviewed and written information provided.   Total time in face-to-face counseling 15 minutes.   F/U Clinic Visit 01/2018 .    Butch PennyLuke Van Ausdall, PharmD, CPP Clinical Pharmacist [facility] [phone]

## 2017-10-30 NOTE — Telephone Encounter (Signed)
Patient was called and informed to contact office for lab results.    If patient returns phone call please inform patient of lab results below. 

## 2017-10-30 NOTE — Patient Instructions (Signed)
Thank you for coming to see us today.   Blood pressure today is controlled.   Continue taking blood pressure medications as prescribed.   Limiting salt and caffeine, as well as exercising as able for at least 30 minutes for 5 days out of the week, can also help you lower your blood pressure.  Take your blood pressure at home if you are able. Please write down these numbers and bring them to your visits.  If you have any questions about medications, please call me [phone].  Franky MachoLuke

## 2017-10-30 NOTE — Telephone Encounter (Signed)
-----   Message from Hoy RegisterEnobong Newlin, MD sent at 10/27/2017 11:05 AM EDT ----- Kidney, liver functions, cholesterol are all normal.

## 2017-11-04 ENCOUNTER — Encounter (HOSPITAL_BASED_OUTPATIENT_CLINIC_OR_DEPARTMENT_OTHER): Payer: Self-pay

## 2017-11-04 ENCOUNTER — Ambulatory Visit: Payer: Self-pay | Attending: Family Medicine

## 2017-11-04 ENCOUNTER — Ambulatory Visit (HOSPITAL_BASED_OUTPATIENT_CLINIC_OR_DEPARTMENT_OTHER): Admit: 2017-11-04 | Payer: Medicaid Other | Admitting: Obstetrics & Gynecology

## 2017-11-04 SURGERY — LAPAROSCOPY, DIAGNOSTIC
Anesthesia: Choice

## 2017-11-09 ENCOUNTER — Telehealth: Payer: Self-pay | Admitting: Family Medicine

## 2017-11-09 MED ORDER — CEPHALEXIN 500 MG PO CAPS: 500.0000 mg | ORAL_CAPSULE | Freq: Two times a day (BID) | ORAL | 0 refills | Status: DC

## 2017-11-09 MED FILL — CEPHALEXIN 500 MG CAPSULE: 500 | 10 days supply | Qty: 20 | Fill #0

## 2017-11-09 NOTE — Telephone Encounter (Signed)
Keflex sent to pharmacy.  Please encourage to keep scheduled appointment for evaluation of breast pain

## 2017-11-09 NOTE — Telephone Encounter (Signed)
Will route to PCP for review. 

## 2017-11-09 NOTE — Telephone Encounter (Signed)
Patient called requesting refills of antibiotics, states that breast is in pain with discharge, has appointment scheduled for Wednesday 08/28.

## 2017-11-09 NOTE — Telephone Encounter (Signed)
Patient was called and informed of medication being sent to pharmacy. 

## 2017-11-11 ENCOUNTER — Ambulatory Visit: Payer: Self-pay | Admitting: Family Medicine

## 2017-11-12 ENCOUNTER — Ambulatory Visit (HOSPITAL_COMMUNITY)
Admission: EM | Admit: 2017-11-12 | Discharge: 2017-11-12 | Disposition: A | Discharge status: Home / Self Care | Payer: Medicaid Other | Attending: Family Medicine | Admitting: Family Medicine

## 2017-11-12 ENCOUNTER — Encounter (HOSPITAL_COMMUNITY): Payer: Self-pay | Admitting: Emergency Medicine

## 2017-11-12 ENCOUNTER — Telehealth: Payer: Self-pay | Admitting: Family Medicine

## 2017-11-12 DIAGNOSIS — R51 Headache: Secondary | ICD-10-CM

## 2017-11-12 DIAGNOSIS — I10 Essential (primary) hypertension: Secondary | ICD-10-CM

## 2017-11-12 MED ORDER — IRBESARTAN 150 MG PO TABS: 150.0000 mg | ORAL_TABLET | Freq: Every day | ORAL | 1 refills | Status: DC

## 2017-11-12 NOTE — ED Triage Notes (Signed)
Pt states she took her BP and it was 144 today, worried about her blood pressure. Pt states earlier she felt pressure behind her eyes but not anymore.

## 2017-11-12 NOTE — Discharge Instructions (Addendum)
Take half of your losartan in the morning and half in the evening Increase hydrochlorothiazide to 2 capsules q. a.m.

## 2017-11-12 NOTE — Telephone Encounter (Signed)
Patient called stating that blood pressure has not improved, patient states that she has taken BP medication and she has been feeling dizzy and has vomited. Patient was advised to go to urgent care if she wasn't feeling better. Please follow up.

## 2017-11-12 NOTE — ED Provider Notes (Signed)
[facility]    CSN: [id number] Arrival date & time: 11/12/17  1724     History   Chief Complaint Chief Complaint  Patient presents with  . Hypertension    HPI Melissa Cooley is a 37 y.o. female.   Patient has a history of hypertension for several years.  She has been taking losartan 50 mg.  Was recently increased to 100 mg.  Recently she had a systolic in the 190s and went to the emergency room and they gave her hydro chlorothiazide 12.5 mg.  Her pressure continues to go up and down.  When it goes up she has some sweats.  She takes her medication in the morning.  HPI  Past Medical History:  Diagnosis Date  . Anxiety   . Depression   . Gout   . Hypertension     Patient Active Problem List   Diagnosis Date Noted  . Family history of breast cancer in mother 05/11/2017  . Obese 04/03/2017  . Hypertension 02/20/2017  . Irritable bowel syndrome 02/20/2017  . GERD (gastroesophageal reflux disease) 02/20/2017    Past Surgical History:  Procedure Laterality Date  . CESAREAN SECTION    . CESAREAN SECTION    . CHOLECYSTECTOMY    . TUBAL LIGATION      OB History    Gravida  5   Para  2   Term      Preterm      AB  3   Living  2     SAB      TAB  3   Ectopic      Multiple      Live Births  2            Home Medications    Prior to Admission medications   Medication Sig Start Date End Date Taking? Authorizing Provider  cephALEXin (KEFLEX) 500 MG capsule Take 1 capsule (500 mg total) by mouth 2 (two) times daily. 11/09/17  Yes Hoy Register, MD  hydrochlorothiazide (HYDRODIURIL) 12.5 MG tablet One tablet a day 10/28/17  Yes Elson Areas, PA-C  losartan (COZAAR) 100 MG tablet Take 1 tablet (100 mg total) by mouth daily. 10/26/17  Yes Hoy Register, MD  pantoprazole (PROTONIX) 40 MG tablet Take 1 tablet (40 mg total) by mouth daily. 10/26/17  Yes Hoy Register, MD  acetaminophen (TYLENOL) 500 MG tablet Take 1,500 mg by mouth every 6  (six) hours as needed for mild pain or headache.    [provider]  albuterol (PROVENTIL HFA;VENTOLIN HFA) 108 (90 Base) MCG/ACT inhaler Inhale 2 puffs into the lungs every 6 (six) hours as needed for wheezing or shortness of breath. Patient not taking: Reported on 10/26/2017 08/06/17   Anders Simmonds, PA-C  Cholecalciferol (VITAMIN D PO) Take 1 capsule by mouth daily.    [provider]  hydrOXYzine (ATARAX/VISTARIL) 10 MG tablet Take 1-2 tab daily as needed for anziety Patient not taking: Reported on 10/28/2017 10/26/17   Hoy Register, MD  ibuprofen (ADVIL,MOTRIN) 200 MG tablet Take 800 mg by mouth every 6 (six) hours as needed for fever, headache or moderate pain.    [provider]  lidocaine (XYLOCAINE) 2 % jelly Apply 1 application topically as needed. Patient not taking: Reported on 10/26/2017 08/26/17   Allie Bossier, MD  PSYLLIUM PO Take 1 capsule by mouth daily.    [provider]  QUEtiapine (SEROQUEL) 50 MG tablet Take 1 tablet (50 mg total) by mouth at  bedtime. Patient not taking: Reported on 10/28/2017 10/26/17 10/26/18  Hoy Register, MD  Vitamin D, Ergocalciferol, (DRISDOL) 50000 units CAPS capsule Take 1 capsule (50,000 Units total) by mouth every 7 (seven) days. Patient not taking: Reported on 10/26/2017 02/28/16   Allie Bossier, MD    Family History Family History  Problem Relation Age of Onset  . Cancer Mother   . Drug abuse Mother   . Bipolar disorder Mother   . Diabetes Father   . Hypertension Other   . Anxiety disorder Sister   . Drug abuse Brother     Social History Social History   Tobacco Use  . Smoking status: Former Games developer  . Smokeless tobacco: Never Used  Substance Use Topics  . Alcohol use: No  . Drug use: No     Allergies   Patient has no known allergies.   Review of Systems Review of Systems  Constitutional: Negative for chills and fever.  HENT: Negative for ear pain and sore throat.   Eyes: Negative for  pain and visual disturbance.  Respiratory: Negative for cough and shortness of breath.   Cardiovascular: Negative for chest pain and palpitations.  Gastrointestinal: Negative for abdominal pain and vomiting.  Genitourinary: Negative for dysuria and hematuria.  Musculoskeletal: Negative for arthralgias and back pain.  Skin: Negative for color change and rash.  Neurological: Positive for headaches. Negative for seizures and syncope.  All other systems reviewed and are negative.    Physical Exam Triage Vital Signs ED Triage Vitals  Enc Vitals Group     BP 11/12/17 1743 119/77     Pulse Rate 11/12/17 1743 70     Resp 11/12/17 1743 16     Temp 11/12/17 1743 98.4 F (36.9 C)     Temp src --      SpO2 11/12/17 1743 100 %     Weight --      Height --      Head Circumference --      Peak Flow --      Pain Score 11/12/17 1744 0     Pain Loc --      Pain Edu? --      Excl. in GC? --    No data found.  Updated Vital Signs BP 119/77   Pulse 70   Temp 98.4 F (36.9 C)   Resp 16   LMP 10/21/2017   SpO2 100%   Visual Acuity Right Eye Distance:   Left Eye Distance:   Bilateral Distance:    Right Eye Near:   Left Eye Near:    Bilateral Near:     Physical Exam  Constitutional: She appears well-developed and well-nourished.  Eyes: Pupils are equal, round, and reactive to light.  Cardiovascular: Normal rate, regular rhythm, normal heart sounds and intact distal pulses.  Pulmonary/Chest: Effort normal and breath sounds normal.     UC Treatments / Results  Labs (all labs ordered are listed, but only abnormal results are displayed) Labs Reviewed - No data to display  EKG None  Radiology No results found.  Procedures Procedures (including critical care time)  Medications Ordered in UC Medications - No data to display  Initial Impression / Assessment and Plan / UC Course  I have reviewed the triage vital signs and the nursing notes.  Pertinent labs & imaging  results that were available during my care of the patient were reviewed by me and considered in my medical decision making (see chart for details).  Hypertension.  Probably needs better control.  I explained that losartan may not be a true 24-hour medication and suggested that she break her 100 mg tablet in half and take half in the morning and half in the evening.  Also suggested she double her hydrochlorothiazide from 12.5 to 25 mg.  When she exhausts her current supply of losartan will switch to another arm such as irbesartan for better control. Final Clinical Impressions(s) / UC Diagnoses   Final diagnoses:  None   Discharge Instructions   None    ED Prescriptions    None     Controlled Substance Prescriptions [place] Controlled Substance Registry consulted? No   Frederica KusterMiller, Eisley Barber M, MD 11/12/17 [place] [id number]

## 2017-11-13 MED FILL — IRBESARTAN 150 MG TABLET: 150 | 30 days supply | Qty: 30 | Fill #0

## 2017-11-13 NOTE — Telephone Encounter (Signed)
Her blood pressure was controlled at 129/86 at her last office visit with me.  If she has an acute illness, she could have elevated blood pressure and will need to present to the ED or urgent if she is dizzy and has been vomiting.  She will also need to keep a blood pressure log and inform us about her numbers.

## 2017-11-13 NOTE — Telephone Encounter (Signed)
Will route to PCP for review. Patient went to ED yesterday.

## 2017-11-17 MED FILL — ?PANTOPRAZOLE SO DR 40MG TA: 40 | 30 days supply | Qty: 30 | Fill #2

## 2017-11-20 ENCOUNTER — Telehealth: Payer: Self-pay | Admitting: Family Medicine

## 2017-11-20 MED FILL — HYDROCHLOROTHIAZIDE 12.5 MG: 12.5 | 30 days supply | Qty: 30 | Fill #1

## 2017-11-20 NOTE — Telephone Encounter (Signed)
Pt needed assistance faxing paperwork to insurance company, left all paperwork upfront in accordion.

## 2017-12-16 ENCOUNTER — Other Ambulatory Visit: Payer: Self-pay | Admitting: Family Medicine

## 2017-12-16 MED ORDER — HYDROCHLOROTHIAZIDE 12.5 MG PO TABS: ORAL_TABLET | ORAL | 1 refills | Status: DC

## 2017-12-16 MED FILL — HYDROCHLOROTHIAZIDE 12.5 MG: 12.5 | 30 days supply | Qty: 30 | Fill #0

## 2017-12-17 MED FILL — IRBESARTAN 150 MG TABLET: 150 | 30 days supply | Qty: 30 | Fill #1

## 2017-12-18 ENCOUNTER — Telehealth: Payer: Self-pay | Admitting: Family Medicine

## 2017-12-18 ENCOUNTER — Telehealth: Payer: Self-pay

## 2017-12-18 NOTE — Telephone Encounter (Signed)
This nurse was informed by the front desk of pt's call and nurse returned call.  Nurse called back and left a message.   Pt returned call at 4:45pm Pt said she has urinary urgency, voids small amts frequently.  Urine is cloudy with a bad smell. She said she has a "heavy feeling" in area of the naval and when she voids it feels "like there is cotton in my urethra".  When asked to clarify, she affirmed that she was trying to describe an uncomfortable feeling but that was not at the level of pain.  This triage nurse gave the advice that  patient should go to an urgent today.  Educated patient on what kind of care an urgent care provides and the benefits of over an emergency room.  [facility] voiced understanding and intent to seek care this evening.

## 2017-12-18 NOTE — Telephone Encounter (Signed)
Patient believes she has a UTI and does not want to go to the ED or be without antibiotics throughout the weekend pleaase follow up with patient.

## 2017-12-23 ENCOUNTER — Ambulatory Visit: Payer: Self-pay | Attending: Family Medicine | Admitting: Physician Assistant

## 2017-12-23 ENCOUNTER — Other Ambulatory Visit: Payer: Self-pay

## 2017-12-23 VITALS — BP 130/84 | HR 66 | Temp 98.1°F | Resp 18 | Ht 71.0 in | Wt 253.4 lb

## 2017-12-23 DIAGNOSIS — I10 Essential (primary) hypertension: Secondary | ICD-10-CM | POA: Insufficient documentation

## 2017-12-23 DIAGNOSIS — Z79899 Other long term (current) drug therapy: Secondary | ICD-10-CM | POA: Insufficient documentation

## 2017-12-23 DIAGNOSIS — N3001 Acute cystitis with hematuria: Secondary | ICD-10-CM

## 2017-12-23 DIAGNOSIS — F419 Anxiety disorder, unspecified: Secondary | ICD-10-CM | POA: Insufficient documentation

## 2017-12-23 DIAGNOSIS — F329 Major depressive disorder, single episode, unspecified: Secondary | ICD-10-CM | POA: Insufficient documentation

## 2017-12-23 DIAGNOSIS — K219 Gastro-esophageal reflux disease without esophagitis: Secondary | ICD-10-CM

## 2017-12-23 DIAGNOSIS — M109 Gout, unspecified: Secondary | ICD-10-CM | POA: Insufficient documentation

## 2017-12-23 LAB — POCT URINALYSIS DIP (CLINITEK)
BILIRUBIN UA: NEGATIVE
BILIRUBIN UA: NEGATIVE mg/dL
GLUCOSE UA: NEGATIVE mg/dL
Nitrite, UA: NEGATIVE
POC,PROTEIN,UA: NEGATIVE
SPEC GRAV UA: 1.015 (ref 1.010–1.025)
UROBILINOGEN UA: 1 U/dL
pH, UA: 7 (ref 5.0–8.0)

## 2017-12-23 MED ORDER — NITROFURANTOIN MONOHYD MACRO 100 MG PO CAPS: 100.0000 mg | ORAL_CAPSULE | Freq: Two times a day (BID) | ORAL | 0 refills | Status: DC

## 2017-12-23 MED ORDER — PANTOPRAZOLE SODIUM 40 MG PO TBEC: 40.0000 mg | DELAYED_RELEASE_TABLET | Freq: Every day | ORAL | 3 refills | Status: DC

## 2017-12-23 MED ORDER — FLUCONAZOLE 150 MG PO TABS: 150.0000 mg | ORAL_TABLET | Freq: Once | ORAL | 0 refills | Status: AC

## 2017-12-23 MED FILL — FLUCONAZOLE 150 MG TABS: 150 | 1 days supply | Qty: 1 | Fill #0

## 2017-12-23 MED FILL — NITROFURANTOIN MONO-MCR 100: 100 | 5 days supply | Qty: 10 | Fill #0

## 2017-12-23 MED FILL — ?PANTOPRAZOLE SO DR 40MG TA: 40 | 30 days supply | Qty: 30 | Fill #0

## 2017-12-23 NOTE — Progress Notes (Signed)
Patient requested for acid reflux medications refilled. 2week, uti symptoms  Pain: 0 protonix refilled.

## 2017-12-23 NOTE — Progress Notes (Signed)
Patient ID: Melissa Cooley, female   DOB: Aug 02, 1980, 37 y.o.   MRN: [id number]   Melissa Cooley, is a 37 y.o. female  QIH:[id number]  OVF:[id number]  DOB - 05-Jun-1980  Subjective:  Chief Complaint and HPI: Melissa Cooley is a 37 y.o. female here today with 2 week h/o mild dysuria on and off for 2 weeks after seeing boyfriend.  She has been trying to drink a lot of water and cranberry.  No pelvic pain.  No vaginal discharge.  Monogamous relationship.  He lives in [place], she lives here.  Together for 9 months.  No fever.  No abdominal pain.     ROS:   Constitutional:  No f/c, No night sweats, No unexplained weight loss. EENT:  No vision changes, No blurry vision, No hearing changes. No mouth, throat, or ear problems.  Respiratory: No cough, No SOB Cardiac: No CP, no palpitations GI:  No abd pain, No N/V/D. GU: some frequency and mild pressure/dysuria Musculoskeletal: No joint pain Neuro: No headache, no dizziness, no motor weakness.  Skin: No rash Endocrine:  No polydipsia. No polyuria.  Psych: Denies SI/HI  No problems updated.  ALLERGIES: No Known Allergies  PAST MEDICAL HISTORY: Past Medical History:  Diagnosis Date  . Anxiety   . Depression   . Gout   . Hypertension     MEDICATIONS AT HOME: Prior to Admission medications   Medication Sig Start Date End Date Taking? Authorizing Provider  acetaminophen (TYLENOL) 500 MG tablet Take 1,500 mg by mouth every 6 (six) hours as needed for mild pain or headache.    [provider]  albuterol (PROVENTIL HFA;VENTOLIN HFA) 108 (90 Base) MCG/ACT inhaler Inhale 2 puffs into the lungs every 6 (six) hours as needed for wheezing or shortness of breath. Patient not taking: Reported on 10/26/2017 08/06/17   Anders Simmonds, PA-C  Cholecalciferol (VITAMIN D PO) Take 1 capsule by mouth daily.    [provider]  fluconazole (DIFLUCAN) 150 MG tablet Take 1 tablet (150 mg total) by mouth once for 1 dose. 12/23/17 12/23/17  Anders Simmonds, PA-C  hydrochlorothiazide (HYDRODIURIL) 12.5 MG tablet One tablet a day 12/16/17   Hoy Register, MD  hydrOXYzine (ATARAX/VISTARIL) 10 MG tablet Take 1-2 tab daily as needed for anziety Patient not taking: Reported on 10/28/2017 10/26/17   Hoy Register, MD  ibuprofen (ADVIL,MOTRIN) 200 MG tablet Take 800 mg by mouth every 6 (six) hours as needed for fever, headache or moderate pain.    [provider]  irbesartan (AVAPRO) 150 MG tablet Take 1 tablet (150 mg total) by mouth daily. 11/12/17   Frederica Kuster, MD  lidocaine (XYLOCAINE) 2 % jelly Apply 1 application topically as needed. Patient not taking: Reported on 10/26/2017 08/26/17   Allie Bossier, MD  losartan (COZAAR) 100 MG tablet Take 1 tablet (100 mg total) by mouth daily. 10/26/17   Hoy Register, MD  nitrofurantoin, macrocrystal-monohydrate, (MACROBID) 100 MG capsule Take 1 capsule (100 mg total) by mouth 2 (two) times daily. 12/23/17   Anders Simmonds, PA-C  pantoprazole (PROTONIX) 40 MG tablet Take 1 tablet (40 mg total) by mouth daily. 10/26/17   Hoy Register, MD  PSYLLIUM PO Take 1 capsule by mouth daily.    [provider]  QUEtiapine (SEROQUEL) 50 MG tablet Take 1 tablet (50 mg total) by mouth at bedtime. Patient not taking: Reported on 10/28/2017 10/26/17 10/26/18  Hoy Register, MD  Vitamin D, Ergocalciferol, (DRISDOL) 50000 units CAPS capsule Take 1  capsule (50,000 Units total) by mouth every 7 (seven) days. Patient not taking: Reported on 10/26/2017 02/28/16   Allie Bossier, MD     Objective:  EXAM:   Vitals:   12/23/17 1619  BP: 130/84  Pulse: 66  Resp: 18  Temp: 98.1 F (36.7 C)  TempSrc: Oral  SpO2: 100%  Weight: 253 lb 6.4 oz (114.9 kg)  Height: 5\' 11"  (1.803 m)    General appearance : A&OX3. NAD. Non-toxic-appearing HEENT: Atraumatic and Normocephalic.  PERRLA. EOM intact.  Neck: supple, no JVD. No cervical lymphadenopathy. No thyromegaly Chest/Lungs:  Breathing-non-labored,  Good air entry bilaterally, breath sounds normal without rales, rhonchi, or wheezing  CVS: S1 S2 regular, no murmurs, gallops, rubs  Extremities: Bilateral Lower Ext shows no edema, both legs are warm to touch with = pulse throughout Neurology:  CN II-XII grossly intact, Non focal.   Psych:  TP linear. J/I WNL. Normal speech. Appropriate eye contact and affect.  Skin:  No Rash  Data Review Lab Results  Component Value Date   HGBA1C 5.5 02/20/2017     Assessment & Plan   1. Acute cystitis with hematuria - POCT URINALYSIS DIP (CLINITEK) - nitrofurantoin, macrocrystal-monohydrate, (MACROBID) 100 MG capsule; Take 1 capsule (100 mg total) by mouth 2 (two) times daily.  Dispense: 10 capsule; Refill: 0 - fluconazole (DIFLUCAN) 150 MG tablet; Take 1 tablet (150 mg total) by mouth once for 1 dose.  Dispense: 1 tablet; Refill: 0 - Urine Culture - Urine cytology ancillary only   Patient have been counseled extensively about nutrition and exercise  Return if symptoms worsen or fail to improve.  The patient was given clear instructions to go to ER or return to medical center if symptoms don't improve, worsen or new problems develop. The patient verbalized understanding. The patient was told to call to get lab results if they haven't heard anything in the next week.     Georgian Co, PA-C [facility] [place], [place] [phone]   12/23/2017, 4:43 PM

## 2017-12-25 LAB — URINE CULTURE

## 2017-12-25 LAB — URINE CYTOLOGY ANCILLARY ONLY
Chlamydia: NEGATIVE
Neisseria Gonorrhea: NEGATIVE
Trichomonas: NEGATIVE

## 2017-12-29 LAB — URINE CYTOLOGY ANCILLARY ONLY
Bacterial vaginitis: NEGATIVE
Candida vaginitis: NEGATIVE

## 2017-12-30 ENCOUNTER — Telehealth: Payer: Self-pay

## 2017-12-30 NOTE — Telephone Encounter (Signed)
-----   Message from Anders Simmonds, [place] sent at 12/28/2017 10:30 AM EDT ----- Please call patient.  Her urine culture did show infection.  The antibiotic should took should take care of it.  No STD.  Results for yeast and BV are not back, but we will call her if either of those are positive.  She can take the yeast infection tablet I sent her after finishing the antibiotics as we discussed. Follow-up as planned.  Thanks, Georgian Co, PA-C

## 2017-12-30 NOTE — Telephone Encounter (Signed)
Patient was called, answered, verified dob and was given most recent lab results. Patient verbalized understanding and had no further questions.  

## 2018-01-12 MED FILL — HYDROCHLOROTHIAZIDE 12.5 MG: 12.5 | 30 days supply | Qty: 30 | Fill #1

## 2018-01-17 ENCOUNTER — Other Ambulatory Visit: Payer: Self-pay

## 2018-01-17 ENCOUNTER — Encounter (HOSPITAL_COMMUNITY): Payer: Self-pay | Admitting: Emergency Medicine

## 2018-01-17 ENCOUNTER — Ambulatory Visit (HOSPITAL_COMMUNITY)
Admission: EM | Admit: 2018-01-17 | Discharge: 2018-01-17 | Disposition: A | Discharge status: Home / Self Care | Payer: Medicaid Other | Attending: Physician Assistant | Admitting: Physician Assistant

## 2018-01-17 DIAGNOSIS — R51 Headache: Secondary | ICD-10-CM

## 2018-01-17 DIAGNOSIS — M791 Myalgia, unspecified site: Secondary | ICD-10-CM

## 2018-01-17 DIAGNOSIS — J111 Influenza due to unidentified influenza virus with other respiratory manifestations: Secondary | ICD-10-CM

## 2018-01-17 DIAGNOSIS — R509 Fever, unspecified: Secondary | ICD-10-CM

## 2018-01-17 DIAGNOSIS — R69 Illness, unspecified: Principal | ICD-10-CM

## 2018-01-17 MED ORDER — OSELTAMIVIR PHOSPHATE 75 MG PO CAPS: 75.0000 mg | ORAL_CAPSULE | Freq: Two times a day (BID) | ORAL | 0 refills | Status: DC

## 2018-01-17 MED ORDER — HYDROCODONE-HOMATROPINE 5-1.5 MG/5ML PO SYRP: 2.5000 mL | ORAL_SOLUTION | Freq: Every day | ORAL | 0 refills | Status: AC

## 2018-01-17 NOTE — Discharge Instructions (Signed)
Start the tamiflu ASAP.  Take ibuprofen 600 mg every 8 hours and then take 1000 mg every 8 hours as well. No work until Tuesday at the earliest. Go home and go to bed.

## 2018-01-17 NOTE — ED Provider Notes (Signed)
01/17/2018 3:57 PM   DOB: 09-22-1980 / MRN: [id number]  SUBJECTIVE:  Melissa Cooley is a 37 y.o. female presenting for HA, fever 104 and myalgia that started yesterday.  Assoicates decreased urinary output.  Had to stay home yesterday.  Denies chest pain, SOB.  Has tried Ibuprofen.   She has No Known Allergies.   She  has a past medical history of Anxiety, Depression, Gout, and Hypertension.    She  reports that she has quit smoking. She has never used smokeless tobacco. She reports that she does not drink alcohol or use drugs. She  reports that she currently engages in sexual activity. She reports using the following method of birth control/protection: Surgical. The patient  has a past surgical history that includes Tubal ligation; Cesarean section; Cholecystectomy; and Cesarean section.  Her family history includes Anxiety disorder in her sister; Bipolar disorder in her mother; Cancer in her mother; Diabetes in her father; Drug abuse in her brother and mother; Hypertension in her other.  Review of Systems  Constitutional: Negative for chills, diaphoresis and fever.  Respiratory: Negative for cough.   Cardiovascular: Negative for chest pain.  Gastrointestinal: Negative for nausea.  Skin: Negative for rash.  Neurological: Negative for dizziness.    OBJECTIVE:  BP 111/71 (BP Location: Left Arm) Comment (BP Location): large cuff  Pulse 99   Temp 98.5 F (36.9 C) (Oral)   Resp 20   SpO2 100%   Wt Readings from Last 3 Encounters:  12/23/17 253 lb 6.4 oz (114.9 kg)  10/28/17 255 lb (115.7 kg)  10/26/17 255 lb 9.6 oz (115.9 kg)   Temp Readings from Last 3 Encounters:  01/17/18 98.5 F (36.9 C) (Oral)  12/23/17 98.1 F (36.7 C) (Oral)  11/12/17 98.4 F (36.9 C)   BP Readings from Last 3 Encounters:  01/17/18 111/71  12/23/17 130/84  11/12/17 119/77   Pulse Readings from Last 3 Encounters:  01/17/18 99  12/23/17 66  11/12/17 70    Physical Exam  Constitutional: She is  oriented to person, place, and time. She appears well-nourished. No distress.  Eyes: Pupils are equal, round, and reactive to light. EOM are normal.  Cardiovascular: Normal rate and regular rhythm.  Pulmonary/Chest: Effort normal and breath sounds normal.  Abdominal: She exhibits no distension.  Neurological: She is alert and oriented to person, place, and time. No cranial nerve deficit. Gait normal.  Skin: Skin is dry. She is not diaphoretic.  Psychiatric: She has a normal mood and affect.  Vitals reviewed.   No results found for this or any previous visit (from the past 72 hour(s)).  No results found.  ASSESSMENT AND PLAN:   Influenza-like illness    Discharge Instructions     Start the tamiflu ASAP.  Take ibuprofen 600 mg every 8 hours and then take 1000 mg every 8 hours as well. No work until Tuesday at the earliest. Go home and go to bed.         The patient is advised to call or return to clinic if she does not see an improvement in symptoms, or to seek the care of the closest emergency department if she worsens with the above plan.   Deliah Boston, MHS, PA-C 01/17/2018 3:57 PM   Ofilia Neas, PA-C 01/17/18 1557

## 2018-01-17 NOTE — ED Triage Notes (Signed)
Sudden onset of not feeling well started yesterday.  Reports fever.  Complains of headache, neck ache and pain around eyes.  Feels very week.  Denies cough.  Sides of torso are sore and complains of nausea.

## 2018-01-19 ENCOUNTER — Other Ambulatory Visit: Payer: Self-pay | Admitting: Family Medicine

## 2018-01-21 ENCOUNTER — Other Ambulatory Visit: Payer: Self-pay | Admitting: Family Medicine

## 2018-01-21 NOTE — Telephone Encounter (Signed)
1) Medication(s) Requested (by name): irbesartan 2) Pharmacy of Choice: [facility]

## 2018-01-22 ENCOUNTER — Encounter: Payer: Self-pay | Admitting: Licensed Clinical Social Worker

## 2018-01-22 MED ORDER — IRBESARTAN 150 MG PO TABS: 150.0000 mg | ORAL_TABLET | Freq: Every day | ORAL | 2 refills | Status: DC

## 2018-01-22 MED FILL — IRBESARTAN 150 MG TABLET: 150 | 30 days supply | Qty: 30 | Fill #0

## 2018-01-22 NOTE — BH Specialist Note (Signed)
Pt presented to clinic to pickup medications. Pt shared that she needs a legal aid referral for an eviction notice. Pt shared that she has paid her rent. Pt reports that her landlord felt threatened after a voicemail that the pt left in regards to her refrigerator not working. Pt shared that Landlord originally gave her two weeks to leave but changed it to 30 days. Pt has been searching for apartments but the eviction notice is now on her record. Pt has until 01/29/18 to vacate apartment. Pt reports that she has tried to contact legal aid but could not get anyone to answer. MSW intern faxed legal aid referral.   Norton Blizzard, MSW Intern 01/22/18, 5:57 PM

## 2018-01-26 ENCOUNTER — Ambulatory Visit (HOSPITAL_BASED_OUTPATIENT_CLINIC_OR_DEPARTMENT_OTHER): Payer: Self-pay | Admitting: Family Medicine

## 2018-01-26 ENCOUNTER — Encounter: Payer: Self-pay | Admitting: Family Medicine

## 2018-01-26 VITALS — BP 117/78 | HR 66 | Temp 97.7°F | Ht 71.0 in | Wt 254.4 lb

## 2018-01-26 DIAGNOSIS — I10 Essential (primary) hypertension: Secondary | ICD-10-CM

## 2018-01-26 NOTE — Progress Notes (Signed)
Patient left prior to being seen by physician.

## 2018-02-09 MED FILL — ?PANTOPRAZOLE SO DR 40MG TA: 40 | 30 days supply | Qty: 30 | Fill #1

## 2018-02-15 ENCOUNTER — Other Ambulatory Visit: Payer: Self-pay | Admitting: Family Medicine

## 2018-02-15 MED FILL — ?HYDROCHLOROTHIAZIDE 12.5MG: 12.5 | 30 days supply | Qty: 30 | Fill #0

## 2018-02-19 MED FILL — IRBESARTAN 150 MG TABLET: 150 | 30 days supply | Qty: 30 | Fill #1

## 2018-03-15 MED FILL — ?PANTOPRAZOLE SO DR 40MG TA: 40 | 30 days supply | Qty: 30 | Fill #2

## 2018-03-15 MED FILL — HYDROCHLOROTHIAZIDE 12.5 MG: 12.5 | 30 days supply | Qty: 30 | Fill #1

## 2018-03-22 MED FILL — IRBESARTAN 150 MG TABLET: 150 | 30 days supply | Qty: 30 | Fill #2

## 2018-04-13 MED FILL — HYDROCHLOROTHIAZIDE 12.5 MG: 12.5 | 30 days supply | Qty: 30 | Fill #2

## 2018-04-13 MED FILL — ?PANTOPRAZOLE SO DR 40MG TA: 40 | 30 days supply | Qty: 30 | Fill #3

## 2018-04-19 ENCOUNTER — Other Ambulatory Visit: Payer: Self-pay | Admitting: Family Medicine

## 2018-04-19 MED FILL — IRBESARTAN 150 MG TABLET: 150 | 30 days supply | Qty: 30 | Fill #0

## 2018-05-05 ENCOUNTER — Ambulatory Visit: Payer: Self-pay | Admitting: Critical Care Medicine

## 2018-05-18 ENCOUNTER — Other Ambulatory Visit: Payer: Self-pay | Admitting: Family Medicine

## 2018-05-19 ENCOUNTER — Encounter: Payer: Self-pay | Admitting: Family Medicine

## 2018-05-19 ENCOUNTER — Ambulatory Visit: Payer: Self-pay | Attending: Family Medicine | Admitting: Family Medicine

## 2018-05-19 VITALS — BP 130/80 | HR 75 | Temp 98.0°F | Ht 71.0 in | Wt 248.2 lb

## 2018-05-19 DIAGNOSIS — I10 Essential (primary) hypertension: Secondary | ICD-10-CM

## 2018-05-19 DIAGNOSIS — K219 Gastro-esophageal reflux disease without esophagitis: Secondary | ICD-10-CM

## 2018-05-19 MED ORDER — HYDROCHLOROTHIAZIDE 12.5 MG PO CAPS: ORAL_CAPSULE | ORAL | 3 refills | Status: DC

## 2018-05-19 MED ORDER — IRBESARTAN 150 MG PO TABS: 150.0000 mg | ORAL_TABLET | Freq: Every day | ORAL | 3 refills | Status: DC

## 2018-05-19 MED ORDER — PANTOPRAZOLE SODIUM 40 MG PO TBEC: 40.0000 mg | DELAYED_RELEASE_TABLET | Freq: Every day | ORAL | 3 refills | Status: DC

## 2018-05-19 MED FILL — ?PANTOPRAZOLE SOD DR 40MG T: 40 | 30 days supply | Qty: 30 | Fill #0

## 2018-05-19 MED FILL — IRBESARTAN 150 MG TABLET: 150 | 30 days supply | Qty: 30 | Fill #0

## 2018-05-19 MED FILL — HYDROCHLOROTHIAZIDE 12.5 MG: 12.5 | 30 days supply | Qty: 30 | Fill #0

## 2018-05-19 NOTE — Patient Instructions (Signed)

## 2018-05-19 NOTE — Progress Notes (Signed)
Subjective:  Patient ID: Melissa Cooley, female    DOB: June 12, 1980  Age: 38 y.o. MRN: [id number]  CC: Hypertension   HPI Melissa Cooley  is a 38 year old female with a history of hypertension, GERD, IBS, bipolar disorder here for a follow-up visit. She requests refills of her PPI for GERD and her antihypertensive and reports doing well on her medications, denies side effects from medications and reflux symptoms are controlled. Denies chest pain, dyspnea. Currently does not exercise regularly. She has no additional concerns today.  Past Medical History:  Diagnosis Date  . Anxiety   . Depression   . Gout   . Hypertension     Past Surgical History:  Procedure Laterality Date  . CESAREAN SECTION    . CESAREAN SECTION    . CHOLECYSTECTOMY    . TUBAL LIGATION      Family History  Problem Relation Age of Onset  . Cancer Mother   . Drug abuse Mother   . Bipolar disorder Mother   . Diabetes Father   . Hypertension Other   . Anxiety disorder Sister   . Drug abuse Brother     No Known Allergies  Outpatient Medications Prior to Visit  Medication Sig Dispense Refill  . acetaminophen (TYLENOL) 500 MG tablet Take 1,500 mg by mouth every 6 (six) hours as needed for mild pain or headache.    . ibuprofen (ADVIL,MOTRIN) 200 MG tablet Take 800 mg by mouth every 6 (six) hours as needed for fever, headache or moderate pain.    . hydrochlorothiazide (MICROZIDE) 12.5 MG capsule TAKE 1 CAPSULE BY MOUTH DAILY. 30 capsule 2  . irbesartan (AVAPRO) 150 MG tablet Take 1 tablet (150 mg total) by mouth daily. MUST MAKE APPT FOR FURTHER REFILLS 30 tablet 0  . pantoprazole (PROTONIX) 40 MG tablet Take 1 tablet (40 mg total) by mouth daily. 30 tablet 3  . Cholecalciferol (VITAMIN D PO) Take 1 capsule by mouth daily.    Marland Kitchen losartan (COZAAR) 100 MG tablet Take 1 tablet (100 mg total) by mouth daily. (Patient not taking: Reported on 05/19/2018) 30 tablet 6  . nitrofurantoin, macrocrystal-monohydrate,  (MACROBID) 100 MG capsule Take 1 capsule (100 mg total) by mouth 2 (two) times daily. (Patient not taking: Reported on 05/19/2018) 10 capsule 0  . oseltamivir (TAMIFLU) 75 MG capsule Take 1 capsule (75 mg total) by mouth every 12 (twelve) hours. (Patient not taking: Reported on 01/26/2018) 10 capsule 0  . PSYLLIUM PO Take 1 capsule by mouth daily.     No facility-administered medications prior to visit.      ROS Review of Systems General: negative for fever, weight loss, appetite change Eyes: no visual symptoms. ENT: no ear symptoms, no sinus tenderness, no nasal congestion or sore throat. Neck: no pain  Respiratory: no wheezing, shortness of breath, cough Cardiovascular: no chest pain, no dyspnea on exertion, no pedal edema, no orthopnea. Gastrointestinal: no abdominal pain, no diarrhea, no constipation Genito-Urinary: no urinary frequency, no dysuria, no polyuria. Hematologic: no bruising Endocrine: no cold or heat intolerance Neurological: no headaches, no seizures, no tremors Musculoskeletal: no joint pains, no joint swelling Skin: no pruritus, no rash. Psychological: no depression, no anxiety,    Objective:  BP 130/80   Pulse 75   Temp 98 F (36.7 C) (Oral)   Ht 5\' 11"  (1.803 m)   Wt 248 lb 3.2 oz (112.6 kg)   SpO2 100%   BMI 34.62 kg/m   BP/Weight 05/19/2018 01/26/2018 01/17/2018  Systolic BP  130 117 111  Diastolic BP 80 78 71  Wt. (Lbs) 248.2 254.4 -  BMI 34.62 35.48 -  Some encounter information is confidential and restricted. Go to Review Flowsheets activity to see all data.      Physical Exam Constitutional: normal appearing,  Eyes: PERRLA HEENT: Head is atraumatic, normal sinuses, normal oropharynx, normal appearing tonsils and palate, tympanic membrane is normal bilaterally. Neck: normal range of motion, no thyromegaly, no JVD Cardiovascular: normal rate and rhythm, normal heart sounds, no murmurs, rub or gallop, no pedal edema Respiratory: Normal breath  sounds, clear to auscultation bilaterally, no wheezes, no rales, no rhonchi Abdomen: soft, not tender to palpation, normal bowel sounds, no enlarged organs Musculoskeletal: Full ROM, no tenderness in joints Skin: warm and dry, no lesions. Neurological: alert, oriented x3, cranial nerves I-XII grossly intact , normal motor strength, normal sensation. Psychological: normal mood.   CMP Latest Ref Rng & Units 10/28/2017 10/26/2017 02/20/2017  Glucose 70 - 99 mg/dL 85 98 82  BUN 6 - 20 mg/dL Creatinine 0.44 - 1.00 mg/dL 1.61 0.96 0.45  Sodium 135 - 145 mmol/L 138 139 136  Potassium 3.5 - 5.1 mmol/L 3.7 3.8 4.0  Chloride 98 - 111 mmol/L 107 107(H) 104  CO2 22 - 32 mmol/L 22 18(L) 19(L)  Calcium 8.9 - 10.3 mg/dL 9.1 8.9 9.0  Total Protein 6.0 - 8.5 g/dL - 6.9 7.4  Total Bilirubin 0.0 - 1.2 mg/dL - 0.3 0.6  Alkaline Phos 39 - 117 IU/L - 60 55  AST 0 - 40 IU/L - 16 23  ALT 0 - 32 IU/L - 14 20    Lipid Panel     Component Value Date/Time   CHOL 124 10/26/2017 1003   TRIG 120 10/26/2017 1003   HDL 34 (L) 10/26/2017 1003   CHOLHDL 3.6 10/26/2017 1003   CHOLHDL 2.8 02/25/2016 1026   VLDL 17 02/25/2016 1026   LDLCALC 66 10/26/2017 1003    CBC    Component Value Date/Time   WBC 8.6 10/28/2017 1750   RBC 4.40 10/28/2017 1750   HGB 13.6 10/28/2017 1750   HGB 13.7 02/20/2017 1443   HCT 41.6 10/28/2017 1750   HCT 40.9 02/20/2017 1443   PLT 359 10/28/2017 1750   PLT 339 02/20/2017 1443   MCV 94.5 10/28/2017 1750   MCV 95 02/20/2017 1443   MCH 30.9 10/28/2017 1750   MCHC 32.7 10/28/2017 1750   RDW 13.2 10/28/2017 1750   RDW 14.1 02/20/2017 1443   LYMPHSABS 3.6 (H) 02/20/2017 1443   EOSABS 0.1 02/20/2017 1443   BASOSABS 0.0 02/20/2017 1443    Lab Results  Component Value Date   HGBA1C 5.5 02/20/2017    Assessment & Plan:   1. Gastroesophageal reflux disease without esophagitis Controlled - pantoprazole (PROTONIX) 40 MG tablet; Take 1 tablet (40 mg total) by mouth  daily.  Dispense: 30 tablet; Refill: 3 - Basic Metabolic Panel  2. Essential hypertension Controlled Counseled on blood pressure goal of less than 130/80, low-sodium, DASH diet, medication compliance, 150 minutes of moderate intensity exercise per week. Discussed medication compliance, adverse effects. - hydrochlorothiazide (MICROZIDE) 12.5 MG capsule; TAKE 1 CAPSULE BY MOUTH DAILY.  Dispense: 30 capsule; Refill: 3 - irbesartan (AVAPRO) 150 MG tablet; Take 1 tablet (150 mg total) by mouth daily.  Dispense: 30 tablet; Refill: 3   Meds ordered this encounter  Medications  . hydrochlorothiazide (MICROZIDE) 12.5 MG capsule    Sig: TAKE 1 CAPSULE BY MOUTH  DAILY.    Dispense:  30 capsule    Refill:  3  . irbesartan (AVAPRO) 150 MG tablet    Sig: Take 1 tablet (150 mg total) by mouth daily.    Dispense:  30 tablet    Refill:  3  . pantoprazole (PROTONIX) 40 MG tablet    Sig: Take 1 tablet (40 mg total) by mouth daily.    Dispense:  30 tablet    Refill:  3    Follow-up: Return in about 3 months (around 08/19/2018) for follow Up of chronic medical conditions.       Hoy Register, MD, FAAFP. [facility] [place], [place] [phone]   05/19/2018, 9:56 AM

## 2018-05-20 LAB — BASIC METABOLIC PANEL
BUN/Creatinine Ratio: 15 (ref 9–23)
BUN: 10 mg/dL (ref 6–20)
CALCIUM: 9.4 mg/dL (ref 8.7–10.2)
CO2: 19 mmol/L — AB (ref 20–29)
CREATININE: 0.65 mg/dL (ref 0.57–1.00)
Chloride: 103 mmol/L (ref 96–106)
GFR calc Af Amer: 131 mL/min/{1.73_m2} (ref >59)
GFR, EST NON AFRICAN AMERICAN: 114 mL/min/{1.73_m2} (ref >59)
GLUCOSE: 88 mg/dL (ref 65–99)
Potassium: 4.3 mmol/L (ref 3.5–5.2)
SODIUM: 137 mmol/L (ref 134–144)

## 2018-05-21 ENCOUNTER — Telehealth: Payer: Self-pay

## 2018-05-21 NOTE — Telephone Encounter (Signed)
-----   Message from Hoy Register, MD sent at 05/20/2018 12:01 PM EST ----- Please inform the patient that labs are normal. Thank you.

## 2018-05-21 NOTE — Telephone Encounter (Signed)
Patient was called and informed of lab results. 

## 2018-06-16 MED FILL — HYDROCHLOROTHIAZIDE 12.5 MG: 12.5 | 30 days supply | Qty: 30 | Fill #1

## 2018-06-16 MED FILL — PANTOPRAZOLE SOD DR 40 MG T: 40 | 30 days supply | Qty: 30 | Fill #1

## 2018-06-16 MED FILL — IRBESARTAN 150 MG TABLET: 150 | 30 days supply | Qty: 30 | Fill #1

## 2018-07-21 MED FILL — IRBESARTAN 150 MG TABLET: 150 | 30 days supply | Qty: 30 | Fill #2

## 2018-07-21 MED FILL — ?PANTOPRAZOLE SOD DR 40MGTA: 40 | 30 days supply | Qty: 30 | Fill #2

## 2018-07-21 MED FILL — HYDROCHLOROTHIAZIDE 12.5 MG: 12.5 | 30 days supply | Qty: 30 | Fill #2

## 2018-07-24 ENCOUNTER — Encounter (HOSPITAL_COMMUNITY): Payer: Self-pay | Admitting: Emergency Medicine

## 2018-07-24 ENCOUNTER — Other Ambulatory Visit: Payer: Self-pay

## 2018-07-24 ENCOUNTER — Emergency Department (HOSPITAL_COMMUNITY): Payer: Self-pay

## 2018-07-24 ENCOUNTER — Emergency Department (HOSPITAL_COMMUNITY)
Admission: EM | Admit: 2018-07-24 | Discharge: 2018-07-24 | Discharge status: Against Medical Advice | Payer: Self-pay | Attending: Emergency Medicine | Admitting: Emergency Medicine

## 2018-07-24 DIAGNOSIS — Z79899 Other long term (current) drug therapy: Secondary | ICD-10-CM | POA: Insufficient documentation

## 2018-07-24 DIAGNOSIS — R51 Headache: Secondary | ICD-10-CM | POA: Insufficient documentation

## 2018-07-24 DIAGNOSIS — R200 Anesthesia of skin: Secondary | ICD-10-CM | POA: Insufficient documentation

## 2018-07-24 DIAGNOSIS — Z1159 Encounter for screening for other viral diseases: Secondary | ICD-10-CM | POA: Insufficient documentation

## 2018-07-24 DIAGNOSIS — R519 Headache, unspecified: Secondary | ICD-10-CM

## 2018-07-24 DIAGNOSIS — Z87891 Personal history of nicotine dependence: Secondary | ICD-10-CM | POA: Insufficient documentation

## 2018-07-24 DIAGNOSIS — W57XXXA Bitten or stung by nonvenomous insect and other nonvenomous arthropods, initial encounter: Secondary | ICD-10-CM | POA: Insufficient documentation

## 2018-07-24 DIAGNOSIS — Z5320 Procedure and treatment not carried out because of patient's decision for unspecified reasons: Secondary | ICD-10-CM | POA: Insufficient documentation

## 2018-07-24 DIAGNOSIS — I10 Essential (primary) hypertension: Secondary | ICD-10-CM | POA: Insufficient documentation

## 2018-07-24 LAB — URINALYSIS, ROUTINE W REFLEX MICROSCOPIC
Bilirubin Urine: NEGATIVE
Glucose, UA: NEGATIVE mg/dL
Hgb urine dipstick: NEGATIVE
Ketones, ur: NEGATIVE mg/dL
Nitrite: POSITIVE — AB
Protein, ur: NEGATIVE mg/dL
Specific Gravity, Urine: 1.015 (ref 1.005–1.030)
pH: 7 (ref 5.0–8.0)

## 2018-07-24 LAB — COMPREHENSIVE METABOLIC PANEL
ALT: 19 U/L (ref 0–44)
AST: 20 U/L (ref 15–41)
Albumin: 3.9 g/dL (ref 3.5–5.0)
Alkaline Phosphatase: 49 U/L (ref 38–126)
Anion gap: 9 (ref 5–15)
BUN: 14 mg/dL (ref 6–20)
CO2: 23 mmol/L (ref 22–32)
Calcium: 9.5 mg/dL (ref 8.9–10.3)
Chloride: 107 mmol/L (ref 98–111)
Creatinine, Ser: 0.74 mg/dL (ref 0.44–1.00)
GFR calc Af Amer: >60 mL/min (ref >60)
GFR calc non Af Amer: >60 mL/min (ref >60)
Glucose, Bld: 87 mg/dL (ref 70–99)
Potassium: 3.8 mmol/L (ref 3.5–5.1)
Sodium: 139 mmol/L (ref 135–145)
Total Bilirubin: 0.7 mg/dL (ref 0.3–1.2)
Total Protein: 7.4 g/dL (ref 6.5–8.1)

## 2018-07-24 LAB — DIFFERENTIAL
Abs Immature Granulocytes: 0.01 10*3/uL (ref 0.00–0.07)
Basophils Absolute: 0 10*3/uL (ref 0.0–0.1)
Basophils Relative: 1 %
Eosinophils Absolute: 0.1 10*3/uL (ref 0.0–0.5)
Eosinophils Relative: 1 %
Immature Granulocytes: 0 %
Lymphocytes Relative: 44 %
Lymphs Abs: 2.9 10*3/uL (ref 0.7–4.0)
Monocytes Absolute: 0.6 10*3/uL (ref 0.1–1.0)
Monocytes Relative: 9 %
Neutro Abs: 2.9 10*3/uL (ref 1.7–7.7)
Neutrophils Relative %: 45 %

## 2018-07-24 LAB — I-STAT BETA HCG BLOOD, ED (MC, WL, AP ONLY): I-stat hCG, quantitative: <5 m[IU]/mL (ref <5)

## 2018-07-24 LAB — CBC
HCT: 40.7 % (ref 36.0–46.0)
Hemoglobin: 13.2 g/dL (ref 12.0–15.0)
MCH: 29.9 pg (ref 26.0–34.0)
MCHC: 32.4 g/dL (ref 30.0–36.0)
MCV: 92.3 fL (ref 80.0–100.0)
Platelets: 431 10*3/uL — ABNORMAL HIGH (ref 150–400)
RBC: 4.41 MIL/uL (ref 3.87–5.11)
RDW: 14.5 % (ref 11.5–15.5)
WBC: 6.5 10*3/uL (ref 4.0–10.5)
nRBC: 0 % (ref 0.0–0.2)

## 2018-07-24 LAB — SARS CORONAVIRUS 2 BY RT PCR (HOSPITAL ORDER, PERFORMED IN ~~LOC~~ HOSPITAL LAB): SARS Coronavirus 2: NEGATIVE

## 2018-07-24 LAB — APTT: aPTT: 26 seconds (ref 24–36)

## 2018-07-24 MED ORDER — DOXYCYCLINE HYCLATE 100 MG PO CAPS: 100.0000 mg | ORAL_CAPSULE | Freq: Two times a day (BID) | ORAL | 0 refills | Status: AC

## 2018-07-24 MED ORDER — PROCHLORPERAZINE EDISYLATE 10 MG/2ML IJ SOLN
10.0000 mg | Freq: Once | INTRAMUSCULAR | Status: AC
  Administered 2018-07-24: 10 mg via INTRAVENOUS
  Filled 2018-07-24: qty 2

## 2018-07-24 MED ORDER — DIPHENHYDRAMINE HCL 50 MG/ML IJ SOLN
25.0000 mg | Freq: Once | INTRAMUSCULAR | Status: AC
  Administered 2018-07-24: 11:00:00 25 mg via INTRAVENOUS
  Filled 2018-07-24: qty 1

## 2018-07-24 MED ORDER — DOXYCYCLINE HYCLATE 100 MG PO TABS
100.0000 mg | ORAL_TABLET | Freq: Once | ORAL | Status: AC
  Administered 2018-07-24: 100 mg via ORAL
  Filled 2018-07-24: qty 1

## 2018-07-24 NOTE — Progress Notes (Signed)
Called RN to let her know pt needs a stat creat or basic metabolic panel before she is able to get contrast for her MRI. RN aware,  said she will put in order. Waiting for lab results so she can get her MRI exams

## 2018-07-24 NOTE — ED Notes (Signed)
Tick embeded behind right ear. Alcohol pad placed. Pt also reports she has no idea how long it has been there. She has had a loss of appetetite for 1 week, 4 days of nausea with vomiting and muscle weakness. Pt concerned she has lyme disease.

## 2018-07-24 NOTE — ED Notes (Signed)
Pt stated she did not want to wait for MRI.  She felt better and she had just wanted the tick removed.  MD notified.

## 2018-07-24 NOTE — ED Provider Notes (Signed)
[facility] EMERGENCY DEPARTMENT Provider Note   CSN: [id number] Arrival date & time: 07/24/18  0915    History   Chief Complaint Chief Complaint  Patient presents with  . Muscle weakness  . Headache  . Emesis  . Tick Removal    HPI Honesty Brien is a 38 y.o. female.     HPI   38 year old female with a history of hypertension presents with concern for 1-1/2 weeks of headache, sensation of right-sided weakness, fever, 4 days of nausea and vomiting, and discovery of a tick behind her right ear today.  Patient with [place] tick attached behind her right ear that she noted today.  Is unknown how long it has been present.  She works as a Lawyer, noted the headache and right-sided weakness symptoms began about a week and a half ago.  Reports the headache is throbbing.  Over the last 4 days she is developed nausea and vomiting which is worse in the morning.  Reports tingling in her bilateral fingers and toes.  Reports she did have associated fevers, with last fever on Wednesday, no fever since that time.  On review of systems also notes occasional double vision which is not associated with any particular time of day, last for short period of time and improves.  She also notes some mild sensation of difficulty urinating "like i have cotton in my urethra."  Also reports body aches, fatigue, decreased appetite.  No known COVID-19 contacts.  No cough, no chest pain or shortness of breath, no abdominal pain no diarrhea or constipation.  Past Medical History:  Diagnosis Date  . Anxiety   . Depression   . Gout   . Hypertension     Patient Active Problem List   Diagnosis Date Noted  . Family history of breast cancer in mother 05/11/2017  . Obese 04/03/2017  . Hypertension 02/20/2017  . Irritable bowel syndrome 02/20/2017  . GERD (gastroesophageal reflux disease) 02/20/2017    Past Surgical History:  Procedure Laterality Date  . CESAREAN SECTION    . CESAREAN SECTION     . CHOLECYSTECTOMY    . TUBAL LIGATION       OB History    Gravida  5   Para  2   Term      Preterm      AB  3   Living  2     SAB      TAB  3   Ectopic      Multiple      Live Births  2            Home Medications    Prior to Admission medications   Medication Sig Start Date End Date Taking? Authorizing Provider  acetaminophen (TYLENOL) 500 MG tablet Take 1,500 mg by mouth every 6 (six) hours as needed for mild pain or headache.   Yes [provider]  hydrochlorothiazide (MICROZIDE) 12.5 MG capsule TAKE 1 CAPSULE BY MOUTH DAILY. Patient taking differently: Take 12.5 mg by mouth daily.  05/19/18  Yes Newlin, Odette Horns, MD  irbesartan (AVAPRO) 150 MG tablet Take 1 tablet (150 mg total) by mouth daily. 05/19/18  Yes Hoy Register, MD  pantoprazole (PROTONIX) 40 MG tablet Take 1 tablet (40 mg total) by mouth daily. 05/19/18  Yes Hoy Register, MD  doxycycline (VIBRAMYCIN) 100 MG capsule Take 1 capsule (100 mg total) by mouth 2 (two) times daily for 10 days. 07/24/18 08/03/18  Alvira Monday, MD  losartan (COZAAR)  100 MG tablet Take 1 tablet (100 mg total) by mouth daily. Patient not taking: Reported on 05/19/2018 10/26/17   Hoy Register, MD    Family History Family History  Problem Relation Age of Onset  . Cancer Mother   . Drug abuse Mother   . Bipolar disorder Mother   . Diabetes Father   . Hypertension Other   . Anxiety disorder Sister   . Drug abuse Brother     Social History Social History   Tobacco Use  . Smoking status: Former Games developer  . Smokeless tobacco: Never Used  Substance Use Topics  . Alcohol use: No  . Drug use: No     Allergies   Patient has no known allergies.   Review of Systems Review of Systems  Constitutional: Positive for appetite change and fatigue. Negative for fever.  HENT: Negative for sore throat.   Eyes: Negative for visual disturbance.  Respiratory: Negative for cough and shortness of breath.    Cardiovascular: Negative for chest pain.  Gastrointestinal: Positive for nausea and vomiting. Negative for abdominal pain, constipation and diarrhea.  Genitourinary: Negative for difficulty urinating.  Musculoskeletal: Negative for back pain and neck pain.  Skin: Negative for rash.  Neurological: Positive for weakness and numbness. Negative for syncope, facial asymmetry, speech difficulty and headaches.     Physical Exam Updated Vital Signs BP 120/74 (BP Location: Right Arm)   Pulse 78   Temp 98.8 F (37.1 C)   Resp 18   SpO2 100%   Physical Exam Vitals signs and nursing note reviewed.  Constitutional:      General: She is not in acute distress.    Appearance: She is well-developed. She is not diaphoretic.  HENT:     Head: Normocephalic and atraumatic.     Comments: lonestar tick attached posterior to right ear, no surrounding rash Eyes:     Conjunctiva/sclera: Conjunctivae normal.  Neck:     Musculoskeletal: Normal range of motion.  Cardiovascular:     Rate and Rhythm: Normal rate and regular rhythm.  Pulmonary:     Effort: Pulmonary effort is normal. No respiratory distress.     Breath sounds: Normal breath sounds. No wheezing or rales.  Abdominal:     General: There is no distension.     Palpations: Abdomen is soft.     Tenderness: There is no abdominal tenderness. There is no guarding.  Musculoskeletal:        General: No tenderness.  Skin:    General: Skin is warm and dry.     Findings: No erythema or rash.  Neurological:     Mental Status: She is alert and oriented to person, place, and time.     Cranial Nerves: Cranial nerves are intact. No cranial nerve deficit, dysarthria or facial asymmetry.     Motor: Motor function is intact. No weakness, tremor or pronator drift.     Coordination: Coordination normal. Finger-Nose-Finger Test normal.     Comments: Reports subjective weakness but no weakness noted on exam Reports decreased sensation on right in comparison  to left of face, arm and leg      ED Treatments / Results  Labs (all labs ordered are listed, but only abnormal results are displayed) Labs Reviewed  CBC - Abnormal; Notable for the following components:      Result Value   Platelets 431 (*)    All other components within normal limits  URINALYSIS, ROUTINE W REFLEX MICROSCOPIC - Abnormal; Notable for the following components:  APPearance HAZY (*)    Nitrite POSITIVE (*)    Leukocytes,Ua TRACE (*)    Bacteria, UA RARE (*)    All other components within normal limits  SARS CORONAVIRUS 2 (HOSPITAL ORDER, PERFORMED IN [facility])  URINE CULTURE  APTT  DIFFERENTIAL  COMPREHENSIVE METABOLIC PANEL  EHRLICHIA ANTIBODY PANEL  ROCKY MTN SPOTTED FVR ABS PNL(IGG+IGM)  I-STAT BETA HCG BLOOD, ED ([facility], [facility], [facility] ONLY)  I-STAT CREATININE, ED    EKG None  Radiology Ct Head Wo Contrast  Result Date: 07/24/2018 CLINICAL DATA:  Headache, right-sided weakness. EXAM: CT HEAD WITHOUT CONTRAST TECHNIQUE: Contiguous axial images were obtained from the base of the skull through the vertex without intravenous contrast. COMPARISON:  CT scan of October 28, 2017. FINDINGS: Brain: No evidence of acute infarction, hemorrhage, hydrocephalus, extra-axial collection or mass lesion/mass effect. Vascular: No hyperdense vessel or unexpected calcification. Skull: Normal. Negative for fracture or focal lesion. Sinuses/Orbits: No acute finding. Other: None. IMPRESSION: Normal head CT. Electronically Signed   By: Lupita RaiderJames  Green Jr M.D.   On: 07/24/2018 10:27    Procedures .Foreign Body Removal Date/Time: 07/24/2018 10:15 AM Performed by: Alvira MondaySchlossman, Tudor Chandley, MD Authorized by: Alvira MondaySchlossman, Della Scrivener, MD  Consent: Verbal consent obtained. Consent given by: patient Patient identity confirmed: verbally with patient Time out: Immediately prior to procedure a "time out" was called to verify the correct patient, procedure, equipment, support staff and site/side marked as  required. Body area: skin General location: head/neck Location details: scalp Removal mechanism: forceps Complexity: simple 1 objects recovered. Objects recovered: lonestar tick Post-procedure assessment: foreign body removed Patient tolerance: Patient tolerated the procedure well with no immediate complications   (including critical care time)  Medications Ordered in ED Medications  prochlorperazine (COMPAZINE) injection 10 mg (10 mg Intravenous Given 07/24/18 1033)  diphenhydrAMINE (BENADRYL) injection 25 mg (25 mg Intravenous Given 07/24/18 1031)  doxycycline (VIBRA-TABS) tablet 100 mg (100 mg Oral Given 07/24/18 1030)     Initial Impression / Assessment and Plan / ED Course  I have reviewed the triage vital signs and the nursing notes.  Pertinent labs & imaging results that were available during my care of the patient were reviewed by me and considered in my medical decision making (see chart for details).        38 year old female with a history of hypertension presents with concern for 1-1/2 weeks of headache, sensation of right-sided weakness, fever, 4 days of nausea and vomiting, and discovery of a tick behind her right ear today.  She has good strength on exam, no signs of tick paralysis.  She does exhibit numbness with ddx including CVA, other intracranial mass, tick born illness, electrolyte abnormality.    DDx for fever includes viral illness including COVID19, tickborne illness and UTI.  Tick attached on arrival to the ED, removed and identified as lonestar tick. Given this type of tick, have low suspicion for lyme/neuro lyme.  Will order ehrlichia ab and RMSF ab.  Given empiric doxycycline in the ED and plan to continue the same.  Notably, the tick does not appear engorged and with duration of symptoms for 1.5wk it seems less likely it is etiology.   Discussed with Dr. Otelia LimesLindzen of Neurology and will obtain MR WWO brain and CSpine.    CBC WNL. CT head WO abnormalities.  EKG evaluated by me and shows normal sinus rhythm without acute abnormalities.   COVID negative.  CMP WNL.  Patient reports she does not want to stay for MRI. Discussed  we are unable to diagnosis CVA, abscess, MS or other abnormalities without MRI and she states understanding.  Pt left against medical advice.     Given rx for doxycycline for tickborne illness. Recommend follow up with PCP.   Final Clinical Impressions(s) / ED Diagnoses   Final diagnoses:  Tick bite, initial encounter  Nonintractable headache, unspecified chronicity pattern, unspecified headache type  Right sided numbness    ED Discharge Orders         Ordered    doxycycline (VIBRAMYCIN) 100 MG capsule  2 times daily     07/24/18 1348           Alvira Monday, MD 07/24/18 1458

## 2018-07-24 NOTE — ED Triage Notes (Signed)
Pt in with muscle weakness, HA and emesis x 4 days. States she also feels her R side is weaker than usual. 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

## 2018-07-26 LAB — EHRLICHIA ANTIBODY PANEL
E chaffeensis (HGE) Ab, IgG: NEGATIVE
E chaffeensis (HGE) Ab, IgM: NEGATIVE
E. Chaffeensis (HME) IgM Titer: NEGATIVE
E.Chaffeensis (HME) IgG: NEGATIVE

## 2018-07-26 LAB — URINE CULTURE: Culture: >=100000 — AB

## 2018-07-27 ENCOUNTER — Telehealth: Payer: Self-pay | Admitting: *Deleted

## 2018-07-27 LAB — ROCKY MTN SPOTTED FVR ABS PNL(IGG+IGM)
RMSF IgG: NEGATIVE
RMSF IgM: 0.61 index (ref 0.00–0.89)

## 2018-07-27 NOTE — Progress Notes (Signed)
ED Antimicrobial Stewardship Positive Culture Follow Up   Melissa Cooley is an 38 y.o. female who presented to [facility] on 07/24/2018 with a chief complaint of tick behind right ear. Patient complained of loss of appetite x 1 week and N/V and muscle weakness x 4 days. Did mention complaints about difficulty urinating. Patient afebrile on admission. UA with rare bacteria, trace leukocytes, nitrite positive, 0-5 squamous epithelial cells, 0-5 WBC. Patient originally prescribed doxycyline for possible RMSF. Patient with >= 100K colonies/mL E. Coli in urine culture.  Plan: Symptom check - if patient asymptomatic, do not treat If patient symptomatic treat with cephalexin 500 mg PO BID for 5 days  Chief Complaint  Patient presents with  . Muscle weakness  . Headache  . Emesis  . Tick Removal    Recent Results (from the past 720 hour(s))  SARS Coronavirus 2 (CEPHEID - Performed in [facility] hospital lab), Hosp Order     Status: None   Collection Time: 07/24/18 10:06 AM  Result Value Ref Range Status   SARS Coronavirus 2 NEGATIVE NEGATIVE Final    Comment: (NOTE) If result is NEGATIVE SARS-CoV-2 target nucleic acids are NOT DETECTED. The SARS-CoV-2 RNA is generally detectable in upper and lower  respiratory specimens during the acute phase of infection. The lowest  concentration of SARS-CoV-2 viral copies this assay can detect is 250  copies / mL. A negative result does not preclude SARS-CoV-2 infection  and should not be used as the sole basis for treatment or other  patient management decisions.  A negative result may occur with  improper specimen collection / handling, submission of specimen other  than nasopharyngeal swab, presence of viral mutation(s) within the  areas targeted by this assay, and inadequate number of viral copies  (<250 copies / mL). A negative result must be combined with clinical  observations, patient history, and epidemiological information. If result is  POSITIVE SARS-CoV-2 target nucleic acids are DETECTED. The SARS-CoV-2 RNA is generally detectable in upper and lower  respiratory specimens dur ing the acute phase of infection.  Positive  results are indicative of active infection with SARS-CoV-2.  Clinical  correlation with patient history and other diagnostic information is  necessary to determine patient infection status.  Positive results do  not rule out bacterial infection or co-infection with other viruses. If result is PRESUMPTIVE POSTIVE SARS-CoV-2 nucleic acids MAY BE PRESENT.   A presumptive positive result was obtained on the submitted specimen  and confirmed on repeat testing.  While 2019 novel coronavirus  (SARS-CoV-2) nucleic acids may be present in the submitted sample  additional confirmatory testing may be necessary for epidemiological  and / or clinical management purposes  to differentiate between  SARS-CoV-2 and other Sarbecovirus currently known to infect humans.  If clinically indicated additional testing with an alternate test  methodology [phone]) is advised. The SARS-CoV-2 RNA is generally  detectable in upper and lower respiratory sp ecimens during the acute  phase of infection. The expected result is Negative. Fact Sheet for Patients:  BoilerBrush.com.cy Fact Sheet for Healthcare Providers: https://pope.com/ This test is not yet approved or cleared by the [place] FDA and has been authorized for detection and/or diagnosis of SARS-CoV-2 by FDA under an Emergency Use Authorization (EUA).  This EUA will remain in effect (meaning this test can be used) for the duration of the COVID-19 declaration under Section 564(b)(1) of the Act, 21 U.S.C. section 360bbb-3(b)(1), unless the authorization is terminated or revoked sooner. Performed at [facility]  [facility], [address] [place], [place] [address]   Urine culture     Status: Abnormal   Collection Time:  07/24/18 10:06 AM  Result Value Ref Range Status   Specimen Description URINE, RANDOM  Final   Special Requests   Final    NONE Performed at [facility], [address] [place], [place] [address]    Culture >=100,000 COLONIES/mL ESCHERICHIA COLI (A)  Final   Report Status 07/26/2018 FINAL  Final   Organism ID, Bacteria ESCHERICHIA COLI (A)  Final      Susceptibility   Escherichia coli - MIC*    AMPICILLIN 8 SENSITIVE Sensitive     CEFAZOLIN <=4 SENSITIVE Sensitive     CEFTRIAXONE <=1 SENSITIVE Sensitive     CIPROFLOXACIN 1 SENSITIVE Sensitive     GENTAMICIN <=1 SENSITIVE Sensitive     IMIPENEM <=0.25 SENSITIVE Sensitive     NITROFURANTOIN <=16 SENSITIVE Sensitive     TRIMETH/SULFA <=20 SENSITIVE Sensitive     AMPICILLIN/SULBACTAM 4 SENSITIVE Sensitive     PIP/TAZO <=4 SENSITIVE Sensitive     Extended ESBL NEGATIVE Sensitive     * >=100,000 COLONIES/mL ESCHERICHIA COLI   ED Provider: Michela PitcherMina Fawze, PA-C  Thank you for allowing pharmacy to be a part of this patient's care.  Lenord Carboebecca Watson Robarge, PharmD PGY1 Pharmacy Resident Phone: [phone]  Please check AMION for all [facility] phone numbers 07/27/2018, 9:16 AM

## 2018-07-27 NOTE — Telephone Encounter (Signed)
Post ED Visit - Positive Culture Follow-up: Unsuccessful Patient Follow-up  Culture assessed and recommendations reviewed by:  []  Enzo Bi, Pharm.D. []  Celedonio Miyamoto, Pharm.D., BCPS AQ-ID []  Garvin Fila, Pharm.D., BCPS []  Georgina Pillion, Pharm.D., BCPS []  [place], [address].D., BCPS, AAHIVP []  Estella Husk, Pharm.D., BCPS, AAHIVP []  Sherlynn Carbon, PharmD []  Pollyann Samples, PharmD, BCPS Lenord Carbo, PhamD  Positive urine culture, reviewed by Michela Pitcher, Pharm D  []  Patient discharged without antimicrobial prescription and treatment is now indicated [x]  Organism is resistant to prescribed ED discharge antimicrobial []  Patient with positive blood cultures  If symptomatic, treat with Keflex 500mg  PO BID x 5 days Unable to contact patient after 3 attempts, letter will be sent to address on file  Lysle Pearl 07/27/2018, 9:45 AM

## 2018-07-31 ENCOUNTER — Telehealth: Payer: Self-pay | Admitting: Emergency Medicine

## 2018-07-31 NOTE — Telephone Encounter (Signed)
Post ED Visit - Positive Culture Follow-up: Successful Patient Follow-Up  Culture assessed and recommendations reviewed by:  []  Enzo Bi, Pharm.D. []  Celedonio Miyamoto, Pharm.D., BCPS AQ-ID []  Garvin Fila, Pharm.D., BCPS []  Georgina Pillion, Pharm.D., BCPS []  [place], [place].D., BCPS, AAHIVP []  Estella Husk, Pharm.D., BCPS, AAHIVP []  Lysle Pearl, PharmD, BCPS []  Phillips Climes, PharmD, BCPS []  Agapito Games, PharmD, BCPS [x]  Lenord Carbo, PharmD  Positive urine culture  []  Patient discharged without antimicrobial prescription and treatment is now indicated [x]  Organism is resistant to prescribed ED discharge antimicrobial []  Patient with positive blood cultures Symptom check, pt symptomatic. Changes discussed with ED provider: Michela Pitcher, PA New antibiotic prescription Keflex 500 mg PO BID x five days Called to [name]) [phone]  Contacted patient, date 07/31/2018, time 1500   Melissa Cooley 07/31/2018, 3:28 PM

## 2018-08-19 MED FILL — HYDROCHLOROTHIAZIDE 12.5 MG: 12.5 | 30 days supply | Qty: 30 | Fill #3

## 2018-08-19 MED FILL — IRBESARTAN 150 MG TABLET: 150 | 30 days supply | Qty: 30 | Fill #3

## 2018-08-23 ENCOUNTER — Telehealth: Payer: Self-pay | Admitting: General Practice

## 2018-08-23 ENCOUNTER — Telehealth: Payer: Self-pay | Admitting: Family Medicine

## 2018-08-23 ENCOUNTER — Other Ambulatory Visit: Payer: Self-pay

## 2018-08-23 DIAGNOSIS — Z20822 Contact with and (suspected) exposure to covid-19: Secondary | ICD-10-CM

## 2018-08-23 MED FILL — ?PANTOPRAZOLE SODI DR 40MGT: 40 | 30 days supply | Qty: 30 | Fill #3

## 2018-08-23 NOTE — Addendum Note (Signed)
Addended by: Dimple Nanas on: 08/23/2018 12:53 PM   Modules accepted: Orders

## 2018-08-23 NOTE — Telephone Encounter (Signed)
Pt has been scheduled covid-19 testing.   Pt was referred by :Charlott Rakes, MD

## 2018-08-23 NOTE — Telephone Encounter (Signed)
Order has been placed.  The [facility] will call her with an appointment

## 2018-08-23 NOTE — Telephone Encounter (Signed)
New Message   Pt states she came in contact with someone who tested positive for Covid and wants an order to get test herself. Please f/u

## 2018-08-23 NOTE — Telephone Encounter (Signed)
-----   Message from Charlott Rakes, MD sent at 08/23/2018 12:32 PM EDT ----- Patient request COVID-19 test due to contact with someone who tested positive for COVID-19.   Primary Visit Coverage   Payer Plan Sponsor Code Group Number Group Name MEDICAID [facility] MEDICAID [place]-FAMILY PLANNING    Primary Visit Coverage Subscriber   ID Name SSN Address [id number] [place] [id number]    [place], [place] [address]     Thank you,Dr. Margarita Rana

## 2018-08-23 NOTE — Telephone Encounter (Signed)
Patient has been contacted and appointment has been set.

## 2018-08-24 LAB — NOVEL CORONAVIRUS, NAA: SARS-CoV-2, NAA: NOT DETECTED

## 2018-08-31 ENCOUNTER — Ambulatory Visit: Payer: Self-pay | Attending: Family Medicine | Admitting: Family Medicine

## 2018-08-31 ENCOUNTER — Other Ambulatory Visit: Payer: Self-pay

## 2018-08-31 DIAGNOSIS — I10 Essential (primary) hypertension: Secondary | ICD-10-CM

## 2018-08-31 DIAGNOSIS — R21 Rash and other nonspecific skin eruption: Secondary | ICD-10-CM

## 2018-08-31 DIAGNOSIS — K219 Gastro-esophageal reflux disease without esophagitis: Secondary | ICD-10-CM

## 2018-08-31 MED ORDER — TRIAMCINOLONE ACETONIDE 0.1 % EX CREA: 1.0000 "application " | TOPICAL_CREAM | Freq: Two times a day (BID) | CUTANEOUS | 0 refills | Status: DC

## 2018-08-31 MED ORDER — IRBESARTAN 150 MG PO TABS: 150.0000 mg | ORAL_TABLET | Freq: Every day | ORAL | 3 refills | Status: DC

## 2018-08-31 MED ORDER — PANTOPRAZOLE SODIUM 40 MG PO TBEC: 40.0000 mg | DELAYED_RELEASE_TABLET | Freq: Every day | ORAL | 3 refills | Status: DC

## 2018-08-31 MED ORDER — HYDROCHLOROTHIAZIDE 12.5 MG PO CAPS: 12.5000 mg | ORAL_CAPSULE | Freq: Every day | ORAL | 3 refills | Status: DC

## 2018-08-31 MED FILL — TRIAMCINOLONE ACETONIDE 0.1: 0.1 | 15 days supply | Qty: 30 | Fill #0

## 2018-08-31 NOTE — Progress Notes (Signed)
Patient has been called and DOB has been verified. Patient has been screened and transferred to PCP to start phone visit.     

## 2018-08-31 NOTE — Progress Notes (Signed)
Virtual Visit via Telephone Note  I connected with [name], on 08/31/2018 at 9:25 AM by telephone due to the COVID-19 pandemic and verified that I am speaking with the correct person using two identifiers.   Consent: I discussed the limitations, risks, security and privacy concerns of performing an evaluation and management service by telephone and the availability of in person appointments. I also discussed with the patient that there may be a patient responsible charge related to this service. The patient expressed understanding and agreed to proceed.   Location of Patient: Work  Theatre managerLocation of Provider: Clinic   Persons participating in Telemedicine visit: Janele Gearing Alicia Farrington-CMA Dr. Nelwyn SalisburyNewlin-PCP     History of Present Illness: Melissa Cooley  is a 38 year old female with a history of hypertension, GERD, IBS, bipolar disorder here for follow-up visit. 1 month ago she had presented to the ED with a tick bite behind her right ear with associated nausea, vomiting, headaches and was treated with doxycycline.  She tested negative for COVID-19.  Today she complains she still has the rash behind her right ear which is hard, itchy and sometimes painful but denies discharge from the lesion. She denies body aches, chills, fever.  She has been compliant with her antihypertensive and reflux symptoms are controlled on her PPI.  For her bipolar disorder she is currently not on medication as she was previously followed by psych and her medications had caused sedation. She has no additional concerns today.   Past Medical History:  Diagnosis Date  . Anxiety   . Depression   . Gout   . Hypertension    No Known Allergies  Current Outpatient Medications on File Prior to Visit  Medication Sig Dispense Refill  . acetaminophen (TYLENOL) 500 MG tablet Take 1,500 mg by mouth every 6 (six) hours as needed for mild pain or headache.    . hydrochlorothiazide (MICROZIDE) 12.5 MG capsule TAKE 1  CAPSULE BY MOUTH DAILY. (Patient taking differently: Take 12.5 mg by mouth daily. ) 30 capsule 3  . irbesartan (AVAPRO) 150 MG tablet Take 1 tablet (150 mg total) by mouth daily. 30 tablet 3  . pantoprazole (PROTONIX) 40 MG tablet Take 1 tablet (40 mg total) by mouth daily. 30 tablet 3  . losartan (COZAAR) 100 MG tablet Take 1 tablet (100 mg total) by mouth daily. (Patient not taking: Reported on 05/19/2018) 30 tablet 6   No current facility-administered medications on file prior to visit.     Observations/Objective: Awake, alert, oriented x3 Not in acute distress  Assessment and Plan: 1. Essential hypertension Stable Counseled on blood pressure goal of less than 130/80, low-sodium, DASH diet, medication compliance, 150 minutes of moderate intensity exercise per week. Discussed medication compliance, adverse effects. - irbesartan (AVAPRO) 150 MG tablet; Take 1 tablet (150 mg total) by mouth daily.  Dispense: 30 tablet; Refill: 3 - hydrochlorothiazide (MICROZIDE) 12.5 MG capsule; Take 1 capsule (12.5 mg total) by mouth daily.  Dispense: 30 capsule; Refill: 3  2. Gastroesophageal reflux disease without esophagitis Stable - pantoprazole (PROTONIX) 40 MG tablet; Take 1 tablet (40 mg total) by mouth daily.  Dispense: 30 tablet; Refill: 3  3. Rash At the site of tick bite which was 1 year ago She has no systemic symptoms Advised to present to the clinic if rash persist for further evaluation - triamcinolone cream (KENALOG) 0.1 %; Apply 1 application topically 2 (two) times daily.  Dispense: 30 g; Refill: 0   Follow Up Instructions: 3 months-call for  an appointment   I discussed the assessment and treatment plan with the patient. The patient was provided an opportunity to ask questions and all were answered. The patient agreed with the plan and demonstrated an understanding of the instructions.   The patient was advised to call back or seek an in-person evaluation if the symptoms worsen or  if the condition fails to improve as anticipated.     I provided 15 minutes total of non-face-to-face time during this encounter including median intraservice time, reviewing previous notes, labs, imaging, medications, management and patient verbalized understanding.     Charlott Rakes, MD, FAAFP. [facility] and [place], [place]   08/31/2018, 9:25 AM

## 2018-09-08 ENCOUNTER — Telehealth: Payer: Self-pay | Admitting: Family Medicine

## 2018-09-08 NOTE — Telephone Encounter (Signed)
t submitted an application with out set-up an appt for financial, application was sent back to Pt via mail and include a list of documents she need submitted after she schedule an appt

## 2018-09-20 MED FILL — IRBESARTAN 150 MG TABLET: 150 | 30 days supply | Qty: 30 | Fill #0

## 2018-09-20 MED FILL — PANTOPRAZOLE SOD DR 40 MG T: 40 | 30 days supply | Qty: 30 | Fill #0

## 2018-09-20 MED FILL — HYDROCHLOROTHIAZIDE 12.5 MG: 12.5 | 30 days supply | Qty: 30 | Fill #0

## 2018-10-07 ENCOUNTER — Telehealth: Payer: Self-pay | Admitting: Family Medicine

## 2018-10-07 NOTE — Telephone Encounter (Signed)
Pt called to request another test for COVID-19. she states she traveled recently to [place] last week and is not allowed to return until  tested Negative. Please follow up if she cannot be tested at this location tomorrow 10/08/2018.

## 2018-10-08 ENCOUNTER — Ambulatory Visit: Payer: Self-pay | Attending: Family Medicine | Admitting: *Deleted

## 2018-10-08 ENCOUNTER — Other Ambulatory Visit: Payer: Self-pay

## 2018-10-08 DIAGNOSIS — Z20822 Contact with and (suspected) exposure to covid-19: Secondary | ICD-10-CM

## 2018-10-08 DIAGNOSIS — Z20828 Contact with and (suspected) exposure to other viral communicable diseases: Secondary | ICD-10-CM

## 2018-10-08 NOTE — Telephone Encounter (Signed)
Patient came in today for testing.

## 2018-10-11 LAB — NOVEL CORONAVIRUS, NAA: SARS-CoV-2, NAA: NOT DETECTED

## 2018-10-19 MED FILL — IRBESARTAN 150 MG TABLET: 150 | 30 days supply | Qty: 30 | Fill #1

## 2018-10-19 MED FILL — PANTOPRAZOLE SOD DR 40 MG T: 40 | 30 days supply | Qty: 30 | Fill #1

## 2018-10-19 MED FILL — HYDROCHLOROTHIAZIDE 12.5 MG: 12.5 | 30 days supply | Qty: 30 | Fill #1

## 2018-11-15 MED FILL — HYDROCHLOROTHIAZIDE 12.5 MG: 12.5 | 30 days supply | Qty: 30 | Fill #2

## 2018-11-15 MED FILL — IRBESARTAN 150 MG TABLET: 150 | 30 days supply | Qty: 30 | Fill #2

## 2018-11-30 MED FILL — PANTOPRAZOLE SOD DR 40 MG T: 40 | 30 days supply | Qty: 30 | Fill #2

## 2018-12-20 MED FILL — IRBESARTAN 150 MG TABLET: 150 | 30 days supply | Qty: 30 | Fill #3

## 2018-12-22 MED FILL — HYDROCHLOROTHIAZIDE 12.5 MG: 12.5 | 30 days supply | Qty: 30 | Fill #3

## 2018-12-23 ENCOUNTER — Telehealth: Payer: Self-pay | Admitting: Family Medicine

## 2018-12-23 ENCOUNTER — Other Ambulatory Visit: Payer: Self-pay

## 2018-12-23 DIAGNOSIS — Z20828 Contact with and (suspected) exposure to other viral communicable diseases: Secondary | ICD-10-CM | POA: Diagnosis not present

## 2018-12-23 DIAGNOSIS — Z20822 Contact with and (suspected) exposure to covid-19: Secondary | ICD-10-CM

## 2018-12-23 NOTE — Telephone Encounter (Signed)
New Message   Pt would like an order sent to [facility] for covid testing. Please f/u

## 2018-12-23 NOTE — Telephone Encounter (Signed)
Order has been placed.

## 2018-12-24 LAB — NOVEL CORONAVIRUS, NAA: SARS-CoV-2, NAA: NOT DETECTED

## 2019-01-06 ENCOUNTER — Other Ambulatory Visit (HOSPITAL_COMMUNITY)
Admission: RE | Admit: 2019-01-06 | Discharge: 2019-01-06 | Disposition: A | Discharge status: Home / Self Care | Payer: Medicaid Other | Source: Ambulatory Visit | Attending: Obstetrics & Gynecology | Admitting: Obstetrics & Gynecology

## 2019-01-06 ENCOUNTER — Other Ambulatory Visit: Payer: Self-pay

## 2019-01-06 ENCOUNTER — Encounter: Payer: Self-pay | Admitting: Obstetrics & Gynecology

## 2019-01-06 ENCOUNTER — Ambulatory Visit (INDEPENDENT_AMBULATORY_CARE_PROVIDER_SITE_OTHER): Payer: Medicaid Other | Admitting: Obstetrics & Gynecology

## 2019-01-06 VITALS — BP 116/71 | HR 57 | Wt 238.8 lb

## 2019-01-06 DIAGNOSIS — Z01419 Encounter for gynecological examination (general) (routine) without abnormal findings: Secondary | ICD-10-CM

## 2019-01-06 DIAGNOSIS — Z803 Family history of malignant neoplasm of breast: Secondary | ICD-10-CM

## 2019-01-06 DIAGNOSIS — Z0000 Encounter for general adult medical examination without abnormal findings: Secondary | ICD-10-CM | POA: Diagnosis not present

## 2019-01-06 DIAGNOSIS — N644 Mastodynia: Secondary | ICD-10-CM

## 2019-01-06 NOTE — Addendum Note (Signed)
Addended by: Emily Filbert on: 01/06/2019 10:10 AM   Modules accepted: Orders

## 2019-01-06 NOTE — Progress Notes (Addendum)
Subjective:    Sander Chandler is a 38 y.o. P2 (65 yo daughter and 29 yo son) who presents for an annual exam.  She reports that her breasts have stopped leaking but they feel "weird", pain when she is tightly hugged. The patient is sexually active. GYN screening history: last pap: was normal. The patient wears seatbelts: yes. The patient participates in regular exercise: yes. Has the patient ever been transfused or tattooed?: yes. The patient reports that there is not domestic violence in her life.  She reports that she has a monthly period but they are not exactly at the same time each month. Menstrual History: OB History    Gravida  5   Para  2   Term      Preterm      AB  3   Living  2     SAB      TAB  3   Ectopic      Multiple      Live Births  2           Menarche age: 49 Patient's last menstrual period was 12/31/2018 (approximate).    The following portions of the patient's history were reviewed and updated as appropriate: allergies, current medications, past family history, past medical history, past social history, past surgical history and problem list.  Review of Systems Pertinent items are noted in HPI.   FH- + breast cancer in mom, + ovarian cancer in maternal aunt, + prostate cancer in maternal GF Her fam med doc is Dr. Margarita Rana at [facility] Does in home care for [place] Had a BTL.    Objective:    BP 116/71   Pulse (!) 57   Wt 238 lb 12.8 oz (108.3 kg)   LMP 12/31/2018 (Approximate)   BMI 33.31 kg/m   General Appearance:    Alert, cooperative, no distress, appears stated age  Head:    Normocephalic, without obvious abnormality, atraumatic  Eyes:    PERRL, conjunctiva/corneas clear, EOM's intact, fundi    benign, both eyes  Ears:    Normal TM's and external ear canals, both ears  Nose:   Nares normal, septum midline, mucosa normal, no drainage    or sinus tenderness  Throat:   Lips, mucosa, and tongue normal;  teeth and gums normal  Neck:   Supple, symmetrical, trachea midline, no adenopathy;    thyroid:  no enlargement/tenderness/nodules; no carotid   bruit or JVD  Back:     Symmetric, no curvature, ROM normal, no CVA tenderness  Lungs:     Clear to auscultation bilaterally, respirations unlabored  Chest Wall:    No tenderness or deformity   Heart:    Regular rate and rhythm, S1 and S2 normal, no murmur, rub   or gallop  Breast Exam:    No tenderness, masses, or nipple abnormality  Abdomen:     Soft, non-tender, bowel sounds active all four quadrants,    no masses, no organomegaly  Genitalia:    Normal female without lesion, discharge or tenderness, normal size and shape, anteverted, minimally mobile, non-tender, normal adnexal exam      Extremities:   Extremities normal, atraumatic, no cyanosis or edema  Pulses:   2+ and symmetric all extremities  Skin:   Skin color, texture, turgor normal, no rashes or lesions  Lymph nodes:   Cervical, supraclavicular, and axillary nodes normal  Neurologic:   CNII-XII intact, normal strength, sensation and reflexes  throughout  .    Assessment:    Healthy female exam.    Plan:     Thin prep Pap smear. with cotesting [facility] testing mammogram

## 2019-01-07 LAB — CYTOLOGY - PAP
Comment: NEGATIVE
Diagnosis: NEGATIVE
High risk HPV: NEGATIVE

## 2019-01-10 ENCOUNTER — Ambulatory Visit: Payer: Medicaid Other

## 2019-01-10 ENCOUNTER — Other Ambulatory Visit: Payer: Self-pay

## 2019-01-10 DIAGNOSIS — Z803 Family history of malignant neoplasm of breast: Secondary | ICD-10-CM

## 2019-01-10 MED FILL — PANTOPRAZOLE SOD DR 40 MG T: 40 | 30 days supply | Qty: 30 | Fill #3

## 2019-01-10 NOTE — Progress Notes (Signed)
[facility] shipped via [employer]. Pt informed that it will take approximately two weeks for results.  Pt verbalized understanding.   Mel Almond, RN 01/10/19

## 2019-01-12 ENCOUNTER — Other Ambulatory Visit: Payer: Self-pay | Admitting: Family Medicine

## 2019-01-12 DIAGNOSIS — R21 Rash and other nonspecific skin eruption: Secondary | ICD-10-CM

## 2019-01-12 DIAGNOSIS — I10 Essential (primary) hypertension: Secondary | ICD-10-CM

## 2019-01-12 MED FILL — TRIAMCINOLONE ACETONIDE 0.1: 0.1 | 15 days supply | Qty: 30 | Fill #0

## 2019-01-12 MED FILL — HYDROCHLOROTHIAZIDE 12.5 MG: 12.5 | 30 days supply | Qty: 30 | Fill #0

## 2019-01-12 MED FILL — IRBESARTAN 150 MG TABLET: 150 | 30 days supply | Qty: 30 | Fill #0

## 2019-01-13 ENCOUNTER — Ambulatory Visit: Payer: Self-pay | Attending: Family Medicine | Admitting: Pharmacist

## 2019-01-13 ENCOUNTER — Other Ambulatory Visit: Payer: Self-pay

## 2019-01-13 DIAGNOSIS — Z23 Encounter for immunization: Secondary | ICD-10-CM

## 2019-01-13 NOTE — Progress Notes (Signed)
Patient presents for vaccination against influenza per orders of Dr. Newlin. Consent given. Counseling provided. No contraindications exists. Vaccine administered without incident.   

## 2019-01-17 ENCOUNTER — Ambulatory Visit: Payer: Medicaid Other

## 2019-01-20 MED FILL — IRBESARTAN 150 MG TABLET: 150 | 30 days supply | Qty: 30 | Fill #0

## 2019-01-20 MED FILL — HYDROCHLOROTHIAZIDE 12.5 MG: 12.5 | 30 days supply | Qty: 30 | Fill #0

## 2019-01-28 ENCOUNTER — Ambulatory Visit: Payer: Self-pay | Attending: Family Medicine

## 2019-01-28 ENCOUNTER — Other Ambulatory Visit: Payer: Self-pay

## 2019-01-31 ENCOUNTER — Other Ambulatory Visit: Payer: Self-pay

## 2019-01-31 DIAGNOSIS — Z20822 Contact with and (suspected) exposure to covid-19: Secondary | ICD-10-CM

## 2019-02-02 LAB — NOVEL CORONAVIRUS, NAA: SARS-CoV-2, NAA: NOT DETECTED

## 2019-02-14 ENCOUNTER — Telehealth: Payer: Self-pay | Admitting: Family Medicine

## 2019-02-14 ENCOUNTER — Other Ambulatory Visit: Payer: Self-pay | Admitting: Family Medicine

## 2019-02-14 DIAGNOSIS — K219 Gastro-esophageal reflux disease without esophagitis: Secondary | ICD-10-CM

## 2019-02-14 NOTE — Telephone Encounter (Signed)
Pt was sent a letter from financial dept. Inform them, that the application they submitted was incomplete, since they were missing some documentation at the time of the appointment, Pt need to reschedule and resubmit all new papers and application for CAFA and [place], P.S. old documents has been sent back by mail to the Pt and Pt. need to make a new appt. 

## 2019-02-15 ENCOUNTER — Telehealth: Payer: Self-pay

## 2019-02-15 ENCOUNTER — Other Ambulatory Visit: Payer: Self-pay

## 2019-02-15 DIAGNOSIS — Z20822 Contact with and (suspected) exposure to covid-19: Secondary | ICD-10-CM

## 2019-02-15 MED FILL — PANTOPRAZOLE SOD DR 40 MG T: 40 | 30 days supply | Qty: 30 | Fill #0

## 2019-02-15 NOTE — Telephone Encounter (Signed)
Notified pt of negative [facility] results per Dr. Rosana Hoes.  Pt verbalized understanding.

## 2019-02-16 MED FILL — IRBESARTAN 150 MG TABLET: 150 | 30 days supply | Qty: 30 | Fill #1

## 2019-02-16 MED FILL — HYDROCHLOROTHIAZIDE 12.5 MG: 12.5 | 30 days supply | Qty: 30 | Fill #1

## 2019-02-17 LAB — NOVEL CORONAVIRUS, NAA: SARS-CoV-2, NAA: NOT DETECTED

## 2019-02-22 ENCOUNTER — Other Ambulatory Visit: Payer: Self-pay

## 2019-02-22 DIAGNOSIS — Z20822 Contact with and (suspected) exposure to covid-19: Secondary | ICD-10-CM

## 2019-02-24 LAB — NOVEL CORONAVIRUS, NAA: SARS-CoV-2, NAA: NOT DETECTED

## 2019-03-03 ENCOUNTER — Ambulatory Visit: Payer: Medicaid Other | Attending: Family Medicine

## 2019-03-03 DIAGNOSIS — Z20822 Contact with and (suspected) exposure to covid-19: Secondary | ICD-10-CM

## 2019-03-03 DIAGNOSIS — Z20828 Contact with and (suspected) exposure to other viral communicable diseases: Secondary | ICD-10-CM | POA: Diagnosis not present

## 2019-03-05 LAB — NOVEL CORONAVIRUS, NAA: SARS-CoV-2, NAA: NOT DETECTED

## 2019-03-14 ENCOUNTER — Ambulatory Visit: Payer: Medicaid Other | Attending: Internal Medicine

## 2019-03-14 DIAGNOSIS — Z20822 Contact with and (suspected) exposure to covid-19: Secondary | ICD-10-CM

## 2019-03-14 DIAGNOSIS — Z20828 Contact with and (suspected) exposure to other viral communicable diseases: Secondary | ICD-10-CM | POA: Diagnosis not present

## 2019-03-15 MED FILL — IRBESARTAN 150 MG TABLET: 150 | 30 days supply | Qty: 30 | Fill #2

## 2019-03-15 MED FILL — PANTOPRAZOLE SOD DR 40 MG T: 40 | 30 days supply | Qty: 30 | Fill #1

## 2019-03-15 MED FILL — HYDROCHLOROTHIAZIDE 12.5 MG: 12.5 | 30 days supply | Qty: 30 | Fill #2

## 2019-03-16 LAB — NOVEL CORONAVIRUS, NAA: SARS-CoV-2, NAA: NOT DETECTED

## 2019-04-04 ENCOUNTER — Ambulatory Visit: Payer: Medicaid Other | Attending: Internal Medicine

## 2019-04-04 DIAGNOSIS — Z20822 Contact with and (suspected) exposure to covid-19: Secondary | ICD-10-CM

## 2019-04-05 LAB — NOVEL CORONAVIRUS, NAA: SARS-CoV-2, NAA: NOT DETECTED

## 2019-04-15 ENCOUNTER — Other Ambulatory Visit: Payer: Self-pay | Admitting: Family Medicine

## 2019-04-15 DIAGNOSIS — F411 Generalized anxiety disorder: Secondary | ICD-10-CM

## 2019-04-15 MED FILL — HYDROCHLOROTHIAZIDE 12.5 MG: 12.5 | 30 days supply | Qty: 30 | Fill #3

## 2019-04-15 MED FILL — PANTOPRAZOLE SOD DR 40 MG T: 40 | 30 days supply | Qty: 30 | Fill #2

## 2019-04-15 MED FILL — IRBESARTAN 150 MG TABLET: 150 | 30 days supply | Qty: 30 | Fill #3

## 2019-04-15 NOTE — Telephone Encounter (Signed)
Hydroxyzine is not on patient's current medication list. Will send to Dr. Alvis Lemmings for review.

## 2019-04-18 MED FILL — hydrOXYzine HCL 10 MG TABS: 10 | 30 days supply | Qty: 60 | Fill #0

## 2019-04-20 ENCOUNTER — Ambulatory Visit: Payer: Medicaid Other | Attending: Internal Medicine

## 2019-04-20 DIAGNOSIS — Z20822 Contact with and (suspected) exposure to covid-19: Secondary | ICD-10-CM

## 2019-04-21 LAB — NOVEL CORONAVIRUS, NAA: SARS-CoV-2, NAA: NOT DETECTED

## 2019-05-18 ENCOUNTER — Encounter (HOSPITAL_COMMUNITY): Payer: Self-pay

## 2019-05-18 ENCOUNTER — Ambulatory Visit (HOSPITAL_COMMUNITY)
Admission: EM | Admit: 2019-05-18 | Discharge: 2019-05-18 | Disposition: A | Discharge status: Home / Self Care | Payer: Medicaid Other | Attending: Family Medicine | Admitting: Family Medicine

## 2019-05-18 ENCOUNTER — Other Ambulatory Visit: Payer: Self-pay

## 2019-05-18 DIAGNOSIS — N6452 Nipple discharge: Secondary | ICD-10-CM | POA: Insufficient documentation

## 2019-05-18 DIAGNOSIS — N644 Mastodynia: Secondary | ICD-10-CM | POA: Insufficient documentation

## 2019-05-18 MED ORDER — ONDANSETRON 4 MG PO TBDP
ORAL_TABLET | ORAL | Status: AC
  Filled 2019-05-18: qty 1

## 2019-05-18 MED ORDER — DOXYCYCLINE HYCLATE 100 MG PO CAPS: 100.0000 mg | ORAL_CAPSULE | Freq: Two times a day (BID) | ORAL | 0 refills | Status: DC

## 2019-05-18 MED ORDER — KETOROLAC TROMETHAMINE 60 MG/2ML IM SOLN
60.0000 mg | Freq: Once | INTRAMUSCULAR | Status: AC
  Administered 2019-05-18: 60 mg via INTRAMUSCULAR

## 2019-05-18 MED ORDER — IBUPROFEN 800 MG PO TABS: 800.0000 mg | ORAL_TABLET | Freq: Three times a day (TID) | ORAL | 0 refills | Status: DC | PRN

## 2019-05-18 MED ORDER — ONDANSETRON 4 MG PO TBDP
4.0000 mg | ORAL_TABLET | Freq: Once | ORAL | Status: AC
  Administered 2019-05-18: 4 mg via ORAL

## 2019-05-18 MED ORDER — KETOROLAC TROMETHAMINE 60 MG/2ML IM SOLN
INTRAMUSCULAR | Status: AC
  Filled 2019-05-18: qty 2

## 2019-05-18 MED ORDER — ONDANSETRON HCL 4 MG PO TABS: 4.0000 mg | ORAL_TABLET | Freq: Four times a day (QID) | ORAL | 0 refills | Status: DC

## 2019-05-18 MED FILL — IBUPROFEN 800 MG TABLET: 800 | 7 days supply | Qty: 21 | Fill #0

## 2019-05-18 MED FILL — DOXYCYCLINE HYCLATE 100 MG: 100 | 20 days supply | Qty: 20 | Fill #0

## 2019-05-18 MED FILL — ONDANSETRON HCL 4 MG TABLET: 4 | 3 days supply | Qty: 12 | Fill #0

## 2019-05-18 NOTE — Discharge Instructions (Addendum)
[facility] [address] [place], [place], [address] [phone]  Call the above number for mammogram.  Take all of the medication. I have swabbed your breast and will culture. We will call  you and inform you of the results and treat accordingly.  Go to the ER for shortness of breath, unrelenting pain, and other concerning symptoms.

## 2019-05-18 NOTE — ED Provider Notes (Signed)
[facility]    CSN: [id number] Arrival date & time: 05/18/19  7544      History   Chief Complaint Chief Complaint  Patient presents with  . breast drainage    HPI Melissa Cooley is a 39 y.o. female.   Reports that she has had nipple discharge from the left breast for the last 2 weeks at least.  She reports left nipple pain for about the last month.  Reports that this is happened before, and they called it mastitis.  Denies that any of the discharge has ever been swabbed for culture.  Had mammogram last in 2018 and it was normal.  Has family history of breast cancer in her mother and her sister.  Denies fever, headache, shortness of breath, nausea, vomiting, diarrhea, rash or other symptoms.  ROS Per HPI  The history is provided by the patient.    Past Medical History:  Diagnosis Date  . Anxiety   . Depression   . Gout   . Hypertension     Patient Active Problem List   Diagnosis Date Noted  . Family history of breast cancer in mother 05/11/2017  . Obese 04/03/2017  . Hypertension 02/20/2017  . Irritable bowel syndrome 02/20/2017  . GERD (gastroesophageal reflux disease) 02/20/2017    Past Surgical History:  Procedure Laterality Date  . CESAREAN SECTION    . CESAREAN SECTION    . CHOLECYSTECTOMY    . TUBAL LIGATION      OB History    Gravida  5   Para  2   Term      Preterm      AB  3   Living  2     SAB      TAB  3   Ectopic      Multiple      Live Births  2            Home Medications    Prior to Admission medications   Medication Sig Start Date End Date Taking? Authorizing Provider  acetaminophen (TYLENOL) 500 MG tablet Take 1,500 mg by mouth every 6 (six) hours as needed for mild pain or headache.    [provider]  doxycycline (VIBRAMYCIN) 100 MG capsule Take 1 capsule (100 mg total) by mouth 2 (two) times daily. 05/18/19   Moshe Cipro, NP  hydrochlorothiazide (MICROZIDE) 12.5 MG capsule TAKE 1 CAPSULE  (12.5 MG TOTAL) BY MOUTH DAILY. 01/12/19   Hoy Register, MD  hydrOXYzine (ATARAX/VISTARIL) 10 MG tablet TAKE 1-2 TABLETS BY MOUTH ONCE DAILY AS NEEDED FOR ANZIETY 04/18/19   Hoy Register, MD  ibuprofen (ADVIL) 800 MG tablet Take 1 tablet (800 mg total) by mouth every 8 (eight) hours as needed for moderate pain. 05/18/19   Moshe Cipro, NP  irbesartan (AVAPRO) 150 MG tablet TAKE 1 TABLET (150 MG TOTAL) BY MOUTH DAILY. 01/12/19   Hoy Register, MD  ondansetron (ZOFRAN) 4 MG tablet Take 1 tablet (4 mg total) by mouth every 6 (six) hours. 05/18/19   Moshe Cipro, NP  pantoprazole (PROTONIX) 40 MG tablet TAKE 1 TABLET (40 MG TOTAL) BY MOUTH DAILY. 02/14/19   Hoy Register, MD  triamcinolone cream (KENALOG) 0.1 % APPLY APPLICATION TOPICALLY 2 (TWO) TIMES DAILY. 01/12/19   Hoy Register, MD    Family History Family History  Problem Relation Age of Onset  . Cancer Mother   . Drug abuse Mother   . Bipolar disorder Mother   . Diabetes Father   .  Hypertension Other   . Anxiety disorder Sister   . Drug abuse Brother     Social History Social History   Tobacco Use  . Smoking status: Former Research scientist (life sciences)  . Smokeless tobacco: Never Used  Substance Use Topics  . Alcohol use: No  . Drug use: No     Allergies   Patient has no known allergies.   Review of Systems Review of Systems   Physical Exam Triage Vital Signs ED Triage Vitals  Enc Vitals Group     BP      Pulse      Resp      Temp      Temp src      SpO2      Weight      Height      Head Circumference      Peak Flow      Pain Score      Pain Loc      Pain Edu?      Excl. in [place]?    No data found.  Updated Vital Signs BP 124/70 (BP Location: Right Arm)   Pulse 76   Temp 98.7 F (37.1 C) (Oral)   Resp 18   Wt 242 lb (109.8 kg)   LMP 05/17/2019   SpO2 98%   BMI 33.75 kg/m   Visual Acuity Right Eye Distance:   Left Eye Distance:   Bilateral Distance:    Right Eye Near:   Left Eye Near:      Bilateral Near:     Physical Exam Vitals and nursing note reviewed.  Constitutional:      General: She is not in acute distress.    Appearance: Normal appearance. She is well-developed.  HENT:     Head: Normocephalic and atraumatic.  Eyes:     Conjunctiva/sclera: Conjunctivae normal.  Cardiovascular:     Rate and Rhythm: Normal rate and regular rhythm.     Heart sounds: Normal heart sounds. No murmur.  Pulmonary:     Effort: Pulmonary effort is normal. No respiratory distress.     Breath sounds: Normal breath sounds.  Chest:     Chest wall: Tenderness present.     Breasts:        Left: Nipple discharge and tenderness present.       Comments: Firm but mobile, nodular area just behind left nipple. Abdominal:     General: Bowel sounds are normal. There is no distension.     Palpations: Abdomen is soft. There is no mass.     Tenderness: There is no abdominal tenderness.     Hernia: No hernia is present.  Musculoskeletal:     Cervical back: Neck supple.  Skin:    General: Skin is warm and dry.     Capillary Refill: Capillary refill takes less than 2 seconds.  Neurological:     General: No focal deficit present.     Mental Status: She is alert and oriented to person, place, and time.  Psychiatric:        Mood and Affect: Mood normal.        Behavior: Behavior normal.      UC Treatments / Results  Labs (all labs ordered are listed, but only abnormal results are displayed) Labs Reviewed  AEROBIC CULTURE (SUPERFICIAL SPECIMEN)    EKG   Radiology No results found.  Procedures Procedures (including critical care time)  Medications Ordered in UC Medications  ketorolac (TORADOL) injection 60 mg (60 mg Intramuscular Given  05/18/19 0919)  ondansetron (ZOFRAN-ODT) disintegrating tablet 4 mg (4 mg Oral Given 05/18/19 0919)    Initial Impression / Assessment and Plan / UC Course  I have reviewed the triage vital signs and the nursing notes.  Pertinent labs & imaging  results that were available during my care of the patient were reviewed by me and considered in my medical decision making (see chart for details).     Presents for left nipple and areola pain and tenderness.  Thick white discharge from left nipple.  Swabbed and will culture.  Will inform patient of results and change therapy if needed.  Prescribe doxycycline 100 mg twice daily x10 days.  Zofran prescribed for nausea as needed every 6 hours.  Ibuprofen 800 mg prescribed every 8 hours as needed for pain.  Received Zofran and 60 mg Toradol IM in office today.  Reviewed previous mammogram and it was normal from 2018.  Guided resources for low cost mammograms in [place].  Instructed patient to have another mammogram.  Instructed patient to follow-up with primary care.  Instructed to go to the ER with unrelenting pain, high fever, severe diarrhea or other concerning symptoms. Final Clinical Impressions(s) / UC Diagnoses   Final diagnoses:  Pain of left breast  Discharge from left nipple     Discharge Instructions      [address] [place], [place], [address] [phone]  Call the above number for mammogram.  Take all of the medication. I have swabbed your breast and will culture. We will call  you and inform you of the results and treat accordingly.  Go to the ER for shortness of breath, unrelenting pain, and other concerning symptoms.      ED Prescriptions    Medication Sig Dispense Auth. Provider   doxycycline (VIBRAMYCIN) 100 MG capsule Take 1 capsule (100 mg total) by mouth 2 (two) times daily. 20 capsule Moshe Cipro, NP   ondansetron (ZOFRAN) 4 MG tablet Take 1 tablet (4 mg total) by mouth every 6 (six) hours. 12 tablet Moshe Cipro, NP   ibuprofen (ADVIL) 800 MG tablet Take 1 tablet (800 mg total) by mouth every 8 (eight) hours as needed for moderate pain. 21 tablet Moshe Cipro, NP     I have reviewed the PDMP during this encounter.     Moshe Cipro, NP 05/18/19 [phone]

## 2019-05-18 NOTE — ED Triage Notes (Signed)
Pt state she has left breast pain and drainage.  This started yesterday.

## 2019-05-20 LAB — AEROBIC CULTURE W GRAM STAIN (SUPERFICIAL SPECIMEN): Culture: NORMAL

## 2019-05-20 LAB — AEROBIC CULTURE? (SUPERFICIAL SPECIMEN)

## 2019-05-23 ENCOUNTER — Other Ambulatory Visit: Payer: Self-pay | Admitting: Family Medicine

## 2019-05-23 DIAGNOSIS — I10 Essential (primary) hypertension: Secondary | ICD-10-CM

## 2019-05-23 DIAGNOSIS — K219 Gastro-esophageal reflux disease without esophagitis: Secondary | ICD-10-CM

## 2019-05-25 ENCOUNTER — Other Ambulatory Visit: Payer: Self-pay | Admitting: Pharmacist

## 2019-05-25 DIAGNOSIS — I10 Essential (primary) hypertension: Secondary | ICD-10-CM

## 2019-05-25 MED ORDER — HYDROCHLOROTHIAZIDE 12.5 MG PO CAPS: 12.5000 mg | ORAL_CAPSULE | Freq: Every day | ORAL | 0 refills | Status: DC

## 2019-05-25 MED ORDER — IRBESARTAN 150 MG PO TABS: 150.0000 mg | ORAL_TABLET | Freq: Every day | ORAL | 0 refills | Status: DC

## 2019-05-25 MED FILL — HYDROCHLOROTHIAZIDE 12.5 MG: 12.5 | 7 days supply | Qty: 7 | Fill #0

## 2019-05-25 MED FILL — IRBESARTAN 150 MG TABLET: 150 | 7 days supply | Qty: 7 | Fill #0

## 2019-06-01 ENCOUNTER — Ambulatory Visit: Payer: Self-pay | Attending: Family Medicine | Admitting: Physician Assistant

## 2019-06-01 ENCOUNTER — Other Ambulatory Visit: Payer: Self-pay

## 2019-06-01 VITALS — BP 130/87 | HR 64 | Temp 98.6°F | Ht 71.0 in | Wt 244.8 lb

## 2019-06-01 DIAGNOSIS — I10 Essential (primary) hypertension: Secondary | ICD-10-CM

## 2019-06-01 DIAGNOSIS — F17200 Nicotine dependence, unspecified, uncomplicated: Secondary | ICD-10-CM

## 2019-06-01 DIAGNOSIS — R5383 Other fatigue: Secondary | ICD-10-CM

## 2019-06-01 DIAGNOSIS — Z803 Family history of malignant neoplasm of breast: Secondary | ICD-10-CM

## 2019-06-01 DIAGNOSIS — N6452 Nipple discharge: Secondary | ICD-10-CM

## 2019-06-01 DIAGNOSIS — K219 Gastro-esophageal reflux disease without esophagitis: Secondary | ICD-10-CM

## 2019-06-01 MED ORDER — VARENICLINE TARTRATE 0.5 MG PO TABS: 0.5000 mg | ORAL_TABLET | Freq: Two times a day (BID) | ORAL | 0 refills | Status: DC

## 2019-06-01 MED ORDER — VARENICLINE TARTRATE 1 MG PO TABS: 1.0000 mg | ORAL_TABLET | Freq: Two times a day (BID) | ORAL | 2 refills | Status: DC

## 2019-06-01 MED ORDER — HYDROCHLOROTHIAZIDE 12.5 MG PO CAPS: 12.5000 mg | ORAL_CAPSULE | Freq: Every day | ORAL | 1 refills | Status: DC

## 2019-06-01 MED ORDER — IRBESARTAN 150 MG PO TABS: 150.0000 mg | ORAL_TABLET | Freq: Every day | ORAL | 0 refills | Status: DC

## 2019-06-01 MED ORDER — PANTOPRAZOLE SODIUM 40 MG PO TBEC: 40.0000 mg | DELAYED_RELEASE_TABLET | Freq: Every day | ORAL | 1 refills | Status: DC

## 2019-06-01 MED FILL — IRBESARTAN 150 MG TABLET: 150 | 30 days supply | Qty: 30 | Fill #0

## 2019-06-01 MED FILL — PANTOPRAZOLE SOD DR 40 MG T: 40 | 30 days supply | Qty: 30 | Fill #0

## 2019-06-01 MED FILL — HYDROCHLOROTHIAZIDE 12.5 MG: 12.5 | 30 days supply | Qty: 30 | Fill #0

## 2019-06-01 NOTE — Progress Notes (Signed)
Kelcie Corti, is a 39 y.o. female  FGH:[id number]  CVE:[id number]  DOB - Dec 17, 1980  Subjective:  Chief Complaint and HPI: Margert Edsall is a 39 y.o. female here today to check on her breasts.  She has had nipple discharge a few times over the years and has been treated for infection-mostly recently just a couple of weeks ago.  She has not had imaging of the breast in a few years due to lack of insurance.  Her mom was diagnosed with breast CA at age 67.  She has not noticed a lump or mass  Also, she has been smoking and needs help quitting.  Admits to a lot of stress.  Recently engaged and is stressed bc she is long-distance from her fiancee.  She would like to try chantix bc it worked well for her mom.    ROS:   Constitutional:  No f/c, No night sweats, No unexplained weight loss. EENT:  No vision changes, No blurry vision, No hearing changes. No mouth, throat, or ear problems.  Respiratory: No cough, No SOB Cardiac: No CP, no palpitations GI:  No abd pain, No N/V/D. GU: No Urinary s/sx Musculoskeletal: No joint pain Neuro: No headache, no dizziness, no motor weakness.  Skin: No rash Endocrine:  No polydipsia. No polyuria.  Psych: Denies SI/HI  No problems updated.  ALLERGIES: No Known Allergies  PAST MEDICAL HISTORY: Past Medical History:  Diagnosis Date  . Anxiety   . Depression   . Gout   . Hypertension     MEDICATIONS AT HOME: Prior to Admission medications   Medication Sig Start Date End Date Taking? Authorizing Provider  acetaminophen (TYLENOL) 500 MG tablet Take 1,500 mg by mouth every 6 (six) hours as needed for mild pain or headache.   Yes [provider]  hydrochlorothiazide (MICROZIDE) 12.5 MG capsule Take 1 capsule (12.5 mg total) by mouth daily. 06/01/19  Yes Maize Brittingham, Marzella Schlein, PA-C  hydrOXYzine (ATARAX/VISTARIL) 10 MG tablet TAKE 1-2 TABLETS BY MOUTH ONCE DAILY AS NEEDED FOR ANZIETY 04/18/19  Yes Newlin, Enobong, MD  irbesartan (AVAPRO) 150 MG tablet  Take 1 tablet (150 mg total) by mouth daily. 05/25/19  Yes Newlin, Enobong, MD  pantoprazole (PROTONIX) 40 MG tablet TAKE 1 TABLET (40 MG TOTAL) BY MOUTH DAILY. 02/14/19  Yes Hoy Register, MD  doxycycline (VIBRAMYCIN) 100 MG capsule Take 1 capsule (100 mg total) by mouth 2 (two) times daily. Patient not taking: Reported on 06/01/2019 05/18/19   Moshe Cipro, NP  ibuprofen (ADVIL) 800 MG tablet Take 1 tablet (800 mg total) by mouth every 8 (eight) hours as needed for moderate pain. Patient not taking: Reported on 06/01/2019 05/18/19   Moshe Cipro, NP  ondansetron (ZOFRAN) 4 MG tablet Take 1 tablet (4 mg total) by mouth every 6 (six) hours. Patient not taking: Reported on 06/01/2019 05/18/19   Moshe Cipro, NP  triamcinolone cream (KENALOG) 0.1 % APPLY APPLICATION TOPICALLY 2 (TWO) TIMES DAILY. Patient not taking: Reported on 06/01/2019 01/12/19   Hoy Register, MD  varenicline (CHANTIX CONTINUING MONTH PAK) 1 MG tablet Take 1 tablet (1 mg total) by mouth 2 (two) times daily. 06/01/19   Anders Simmonds, PA-C  varenicline (CHANTIX) 0.5 MG tablet Take 1 tablet (0.5 mg total) by mouth 2 (two) times daily. 06/01/19   Anders Simmonds, PA-C     Objective:  EXAM:   Vitals:   06/01/19 0946  BP: 130/87  Pulse: 64  Temp: 98.6 F (37 C)  TempSrc: Oral  SpO2: 97%  Weight: 244 lb 12.8 oz (111 kg)  Height: 5\' 11"  (1.803 m)    General appearance : A&OX3. NAD. Non-toxic-appearing HEENT: Atraumatic and Normocephalic.  PERRLA. EOM intact.  Chest/Lungs:  Breathing-non-labored, Good air entry bilaterally, breath sounds normal without rales, rhonchi, or wheezing  CVS: S1 S2 regular, no murmurs, gallops, rubs  B breasts examined:  Large pendulous breasts w/o skin changes.  No mass appreciated.  No erythema or abscess noted.  I am able to express a drop of clostrum-like substance from her L nipple.   Neurology:  CN II-XII grossly intact, Non focal.   Psych:  TP linear. J/I WNL. Normal  speech. Appropriate eye contact and affect.  Skin:  No Rash  Data Review Lab Results  Component Value Date   HGBA1C 5.5 02/20/2017     Assessment & Plan   1. Essential hypertension Stable-continue current regimen - Basic metabolic panel - hydrochlorothiazide (MICROZIDE) 12.5 MG capsule; Take 1 capsule (12.5 mg total) by mouth daily.  Dispense: 90 capsule; Refill: 1  2. Nipple discharge LMP just finished - Prolactin - MM Digital Screening; Future  3. Other fatigue - Thyroid Panel With TSH - Vitamin D, 25-hydroxy  4. Family history of breast cancer - MM Digital Screening; Future  5. Smoking Smoking and dangers of nicotine have been discussed at length. Long term health consequences of smoking reviewed in detail.  Methods for helping with cessation have been reviewed.  Patient expresses understanding - varenicline (CHANTIX) 0.5 MG tablet; Take 1 tablet (0.5 mg total) by mouth 2 (two) times daily.  Dispense: 60 tablet; Refill: 0 - varenicline (CHANTIX CONTINUING MONTH PAK) 1 MG tablet; Take 1 tablet (1 mg total) by mouth 2 (two) times daily.  Dispense: 60 tablet; Refill: 2   Counseling on stress management, smoking cessation, breast health all discussed at length.  Spent >14mins face to face on counseling  Patient have been counseled extensively about nutrition and exercise  Return in about 3 months (around 09/01/2019) for PCP.  The patient was given clear instructions to go to ER or return to medical center if symptoms don't improve, worsen or new problems develop. The patient verbalized understanding. The patient was told to call to get lab results if they haven't heard anything in the next week.     Freeman Caldron, PA-C [facility] [place], [place]   06/01/2019, 10:08 AMPatient ID: Hurshel Keys, female   DOB: 04-12-1980, 39 y.o.   MRN: [id number]

## 2019-06-01 NOTE — Patient Instructions (Signed)
Coping with Quitting Smoking  Quitting smoking is a physical and mental challenge. You will face cravings, withdrawal symptoms, and temptation. Before quitting, work with your health care provider to make a plan that can help you cope. Preparation can help you quit and keep you from giving in. How can I cope with cravings? Cravings usually last for 5-10 minutes. If you get through it, the craving will pass. Consider taking the following actions to help you cope with cravings:  Keep your mouth busy: ? Chew sugar-free gum. ? Suck on hard candies or a straw. ? Brush your teeth.  Keep your hands and body busy: ? Immediately change to a different activity when you feel a craving. ? Squeeze or play with a ball. ? Do an activity or a hobby, like making bead jewelry, practicing needlepoint, or working with wood. ? Mix up your normal routine. ? Take a short exercise break. Go for a quick walk or run up and down stairs. ? Spend time in public places where smoking is not allowed.  Focus on doing something kind or helpful for someone else.  Call a friend or family member to talk during a craving.  Join a support group.  Call a quit line, such as 1-800-QUIT-NOW.  Talk with your health care provider about medicines that might help you cope with cravings and make quitting easier for you. How can I deal with withdrawal symptoms? Your body may experience negative effects as it tries to get used to not having nicotine in the system. These effects are called withdrawal symptoms. They may include:  Feeling hungrier than normal.  Trouble concentrating.  Irritability.  Trouble sleeping.  Feeling depressed.  Restlessness and agitation.  Craving a cigarette. To manage withdrawal symptoms:  Avoid places, people, and activities that trigger your cravings.  Remember why you want to quit.  Get plenty of sleep.  Avoid coffee and other caffeinated drinks. These may worsen some of your  symptoms. How can I handle social situations? Social situations can be difficult when you are quitting smoking, especially in the first few weeks. To manage this, you can:  Avoid parties, bars, and other social situations where people might be smoking.  Avoid alcohol.  Leave right away if you have the urge to smoke.  Explain to your family and friends that you are quitting smoking. Ask for understanding and support.  Plan activities with friends or family where smoking is not an option. What are some ways I can cope with stress? Wanting to smoke may cause stress, and stress can make you want to smoke. Find ways to manage your stress. Relaxation techniques can help. For example:  Breathe slowly and deeply, in through your nose and out through your mouth.  Listen to soothing, relaxing music.  Talk with a family member or friend about your stress.  Light a candle.  Soak in a bath or take a shower.  Think about a peaceful place. What are some ways I can prevent weight gain? Be aware that many people gain weight after they quit smoking. However, not everyone does. To keep from gaining weight, have a plan in place before you quit and stick to the plan after you quit. Your plan should include:  Having healthy snacks. When you have a craving, it may help to: ? Eat plain popcorn, crunchy carrots, celery, or other cut vegetables. ? Chew sugar-free gum.  Changing how you eat: ? Eat small portion sizes at meals. ? Eat 4-6 small meals   throughout the day instead of 1-2 large meals a day. ? Be mindful when you eat. Do not watch television or do other things that might distract you as you eat.  Exercising regularly: ? Make time to exercise each day. If you do not have time for a long workout, do short bouts of exercise for 5-10 minutes several times a day. ? Do some form of strengthening exercise, like weight lifting, and some form of aerobic exercise, like running or swimming.  Drinking  plenty of water or other low-calorie or no-calorie drinks. Drink 6-8 glasses of water daily, or as much as instructed by your health care provider. Summary  Quitting smoking is a physical and mental challenge. You will face cravings, withdrawal symptoms, and temptation to smoke again. Preparation can help you as you go through these challenges.  You can cope with cravings by keeping your mouth busy (such as by chewing gum), keeping your body and hands busy, and making calls to family, friends, or a helpline for people who want to quit smoking.  You can cope with withdrawal symptoms by avoiding places where people smoke, avoiding drinks with caffeine, and getting plenty of rest.  Ask your health care provider about the different ways to prevent weight gain, avoid stress, and handle social situations. This information is not intended to replace advice given to you by your health care provider. Make sure you discuss any questions you have with your health care provider. Document Revised: 02/13/2017 Document Reviewed: 02/29/2016 Elsevier Patient Education  2020 Elsevier Inc.  

## 2019-06-01 NOTE — Progress Notes (Signed)
Need referral for left breast

## 2019-06-02 LAB — BASIC METABOLIC PANEL
BUN/Creatinine Ratio: 23 (ref 9–23)
BUN: 16 mg/dL (ref 6–20)
CO2: 22 mmol/L (ref 20–29)
Calcium: 10 mg/dL (ref 8.7–10.2)
Chloride: 104 mmol/L (ref 96–106)
Creatinine, Ser: 0.71 mg/dL (ref 0.57–1.00)
GFR calc Af Amer: 125 mL/min/{1.73_m2} (ref >59)
GFR calc non Af Amer: 108 mL/min/{1.73_m2} (ref >59)
Glucose: 86 mg/dL (ref 65–99)
Potassium: 5.1 mmol/L (ref 3.5–5.2)
Sodium: 138 mmol/L (ref 134–144)

## 2019-06-02 LAB — PROLACTIN: Prolactin: 18.9 ng/mL (ref 4.8–23.3)

## 2019-06-02 LAB — VITAMIN D 25 HYDROXY (VIT D DEFICIENCY, FRACTURES): Vit D, 25-Hydroxy: 36.8 ng/mL (ref 30.0–100.0)

## 2019-06-02 LAB — THYROID PANEL WITH TSH
Free Thyroxine Index: 2.6 (ref 1.2–4.9)
T3 Uptake Ratio: 27 % (ref 24–39)
T4, Total: 9.6 ug/dL (ref 4.5–12.0)
TSH: 0.857 u[IU]/mL (ref 0.450–4.500)

## 2019-06-08 ENCOUNTER — Encounter: Payer: Self-pay | Admitting: *Deleted

## 2019-06-16 ENCOUNTER — Ambulatory Visit: Payer: Medicaid Other | Attending: Internal Medicine

## 2019-06-16 DIAGNOSIS — Z23 Encounter for immunization: Secondary | ICD-10-CM

## 2019-06-16 NOTE — Progress Notes (Signed)
   Covid-19 Vaccination Clinic  Name:  Melissa Cooley    MRN: [id number] DOB: 1980-07-26  06/16/2019  Ms. Melissa Cooley was observed post Covid-19 immunization for 15 minutes without incident. She was provided with Vaccine Information Sheet and instruction to access the V-Safe system.   Ms. Melissa Cooley was instructed to call 911 with any severe reactions post vaccine: Marland Kitchen Difficulty breathing  . Swelling of face and throat  . A fast heartbeat  . A bad rash all over body  . Dizziness and weakness   Immunizations Administered    Name Date Dose VIS Date Route   Pfizer COVID-19 Vaccine 06/16/2019  8:43 AM 0.3 mL 02/25/2019 Intramuscular   Manufacturer: [employer], [employer]   Lot: [id number]   NDC: [address]-3

## 2019-06-17 ENCOUNTER — Ambulatory Visit
Admission: RE | Admit: 2019-06-17 | Discharge: 2019-06-17 | Disposition: A | Discharge status: Home / Self Care | Payer: Medicaid Other | Source: Ambulatory Visit | Attending: Physician Assistant | Admitting: Physician Assistant

## 2019-06-17 ENCOUNTER — Other Ambulatory Visit: Payer: Self-pay

## 2019-06-17 DIAGNOSIS — Z803 Family history of malignant neoplasm of breast: Secondary | ICD-10-CM

## 2019-06-17 DIAGNOSIS — N6452 Nipple discharge: Secondary | ICD-10-CM

## 2019-06-30 MED FILL — HYDROCHLOROTHIAZIDE 12.5 MG: 12.5 | 30 days supply | Qty: 30 | Fill #1

## 2019-06-30 MED FILL — PANTOPRAZOLE SOD DR 40 MG T: 40 | 30 days supply | Qty: 30 | Fill #1

## 2019-06-30 MED FILL — IRBESARTAN 150 MG TABLET: 150 | 30 days supply | Qty: 30 | Fill #1

## 2019-07-06 ENCOUNTER — Ambulatory Visit: Payer: Medicaid Other | Attending: Internal Medicine

## 2019-07-06 DIAGNOSIS — Z20822 Contact with and (suspected) exposure to covid-19: Secondary | ICD-10-CM

## 2019-07-08 LAB — NOVEL CORONAVIRUS, NAA: SARS-CoV-2, NAA: NOT DETECTED

## 2019-07-08 LAB — SARS-COV-2, NAA 2 DAY TAT

## 2019-07-11 ENCOUNTER — Ambulatory Visit: Payer: Self-pay

## 2019-07-18 ENCOUNTER — Ambulatory Visit: Payer: Medicaid Other | Attending: Internal Medicine

## 2019-07-18 DIAGNOSIS — Z23 Encounter for immunization: Secondary | ICD-10-CM

## 2019-07-18 NOTE — Progress Notes (Signed)
   Covid-19 Vaccination Clinic  Name:  Melissa Cooley    MRN: [id number] DOB: [birth year]  07/18/2019  [name] was observed post Covid-19 immunization for 15 minutes without incident. She was provided with Vaccine Information Sheet and instruction to access the V-Safe system.   [name] was instructed to call 911 with any severe reactions post vaccine: Marland Kitchen Difficulty breathing  . Swelling of face and throat  . A fast heartbeat  . A bad rash all over body  . Dizziness and weakness   Immunizations Administered    Name Date Dose VIS Date Route   Pfizer COVID-19 Vaccine 07/18/2019  5:00 PM 0.3 mL 05/11/2018 Intramuscular   Manufacturer: [employer], [employer]   Lot: [id number]   NDC: [address]-4

## 2019-08-01 MED FILL — IRBESARTAN 150 MG TABLET: 150 | 30 days supply | Qty: 30 | Fill #2

## 2019-08-01 MED FILL — HYDROCHLOROTHIAZIDE 12.5 MG: 12.5 | 30 days supply | Qty: 30 | Fill #2

## 2019-08-01 MED FILL — PANTOPRAZOLE SOD DR 40 MG T: 40 | 30 days supply | Qty: 30 | Fill #2

## 2019-08-30 MED FILL — PANTOPRAZOLE SOD DR 40 MG T: 40 | 30 days supply | Qty: 30 | Fill #3

## 2019-08-30 MED FILL — HYDROCHLOROTHIAZIDE 12.5 MG: 12.5 | 30 days supply | Qty: 30 | Fill #3

## 2019-09-01 ENCOUNTER — Other Ambulatory Visit: Payer: Self-pay | Admitting: Pharmacist

## 2019-09-01 DIAGNOSIS — I10 Essential (primary) hypertension: Secondary | ICD-10-CM

## 2019-09-01 MED ORDER — IRBESARTAN 150 MG PO TABS: 150.0000 mg | ORAL_TABLET | Freq: Every day | ORAL | 0 refills | Status: DC

## 2019-09-01 MED FILL — IRBESARTAN 150 MG TABLET: 150 | 30 days supply | Qty: 30 | Fill #0

## 2019-09-05 ENCOUNTER — Other Ambulatory Visit: Payer: Self-pay | Admitting: Family Medicine

## 2019-09-05 ENCOUNTER — Other Ambulatory Visit: Payer: Self-pay

## 2019-09-05 ENCOUNTER — Ambulatory Visit: Payer: Self-pay | Attending: Family Medicine | Admitting: Family Medicine

## 2019-09-05 ENCOUNTER — Encounter: Payer: Self-pay | Admitting: Family Medicine

## 2019-09-05 VITALS — BP 128/77 | HR 71 | Ht 71.0 in | Wt 252.4 lb

## 2019-09-05 DIAGNOSIS — T148XXA Other injury of unspecified body region, initial encounter: Secondary | ICD-10-CM

## 2019-09-05 DIAGNOSIS — M549 Dorsalgia, unspecified: Secondary | ICD-10-CM | POA: Insufficient documentation

## 2019-09-05 DIAGNOSIS — I10 Essential (primary) hypertension: Secondary | ICD-10-CM

## 2019-09-05 DIAGNOSIS — Z79899 Other long term (current) drug therapy: Secondary | ICD-10-CM | POA: Insufficient documentation

## 2019-09-05 DIAGNOSIS — F419 Anxiety disorder, unspecified: Secondary | ICD-10-CM | POA: Insufficient documentation

## 2019-09-05 DIAGNOSIS — R0600 Dyspnea, unspecified: Secondary | ICD-10-CM | POA: Insufficient documentation

## 2019-09-05 DIAGNOSIS — R5383 Other fatigue: Secondary | ICD-10-CM

## 2019-09-05 DIAGNOSIS — Z8249 Family history of ischemic heart disease and other diseases of the circulatory system: Secondary | ICD-10-CM

## 2019-09-05 DIAGNOSIS — K219 Gastro-esophageal reflux disease without esophagitis: Secondary | ICD-10-CM

## 2019-09-05 DIAGNOSIS — Z23 Encounter for immunization: Secondary | ICD-10-CM

## 2019-09-05 DIAGNOSIS — Y939 Activity, unspecified: Secondary | ICD-10-CM | POA: Insufficient documentation

## 2019-09-05 DIAGNOSIS — I8393 Asymptomatic varicose veins of bilateral lower extremities: Secondary | ICD-10-CM | POA: Insufficient documentation

## 2019-09-05 DIAGNOSIS — Y999 Unspecified external cause status: Secondary | ICD-10-CM | POA: Insufficient documentation

## 2019-09-05 DIAGNOSIS — Y929 Unspecified place or not applicable: Secondary | ICD-10-CM | POA: Insufficient documentation

## 2019-09-05 DIAGNOSIS — I83893 Varicose veins of bilateral lower extremities with other complications: Secondary | ICD-10-CM

## 2019-09-05 DIAGNOSIS — R0609 Other forms of dyspnea: Secondary | ICD-10-CM

## 2019-09-05 MED ORDER — HYDROCHLOROTHIAZIDE 12.5 MG PO CAPS: 12.5000 mg | ORAL_CAPSULE | Freq: Every day | ORAL | 1 refills | Status: DC

## 2019-09-05 MED ORDER — IRBESARTAN 150 MG PO TABS: 150.0000 mg | ORAL_TABLET | Freq: Every day | ORAL | 1 refills | Status: DC

## 2019-09-05 MED ORDER — PANTOPRAZOLE SODIUM 40 MG PO TBEC: 40.0000 mg | DELAYED_RELEASE_TABLET | Freq: Every day | ORAL | 1 refills | Status: DC

## 2019-09-05 NOTE — Progress Notes (Signed)
Subjective:  Patient ID: Melissa Cooley, female    DOB: 1980-05-23  Age: 39 y.o. MRN: [id number]  CC: Hypertension   HPI Melissa Cooley is a 39 year old female with a history of hypertension who presents today for follow-up visit. She has varicose veins which are sometimes painful and she would like this evaluated.  States a family member had this as well and underwent surgery. She has been bruising ever since she got her COVID -19 vaccine - received 2 doses of Pfizer vaccine (last dose was 07/18/19) , no h/o trauma, she is fatigued and has mid back pain with deep breathing, she is fatigued on little exertion. She has a Fhx of thromboembolic disease; cousin passed at 4 yrs of a PE and other family members have had thromboembolic disease and this has her concerned.  She informs me she regrets receiving the COVID-19 vaccine. She does not have pedal edema or calf pain.  Compliant with her antihypertensive and her PPI. Past Medical History:  Diagnosis Date  . Anxiety   . Depression   . Gout   . Hypertension     Past Surgical History:  Procedure Laterality Date  . CESAREAN SECTION    . CESAREAN SECTION    . CHOLECYSTECTOMY    . TUBAL LIGATION      Family History  Problem Relation Age of Onset  . Cancer Mother   . Drug abuse Mother   . Bipolar disorder Mother   . Breast cancer Mother 56  . Diabetes Father   . Hypertension Other   . Anxiety disorder Sister   . Drug abuse Brother     No Known Allergies  Outpatient Medications Prior to Visit  Medication Sig Dispense Refill  . acetaminophen (TYLENOL) 500 MG tablet Take 1,500 mg by mouth every 6 (six) hours as needed for mild pain or headache.    . hydrochlorothiazide (MICROZIDE) 12.5 MG capsule Take 1 capsule (12.5 mg total) by mouth daily. 90 capsule 1  . hydrOXYzine (ATARAX/VISTARIL) 10 MG tablet TAKE 1-2 TABLETS BY MOUTH ONCE DAILY AS NEEDED FOR ANZIETY 60 tablet 0  . irbesartan (AVAPRO) 150 MG tablet Take 1 tablet (150 mg  total) by mouth daily. 30 tablet 0  . pantoprazole (PROTONIX) 40 MG tablet Take 1 tablet (40 mg total) by mouth daily. 90 tablet 1  . varenicline (CHANTIX CONTINUING MONTH PAK) 1 MG tablet Take 1 tablet (1 mg total) by mouth 2 (two) times daily. 60 tablet 2  . varenicline (CHANTIX) 0.5 MG tablet Take 1 tablet (0.5 mg total) by mouth 2 (two) times daily. 60 tablet 0  . doxycycline (VIBRAMYCIN) 100 MG capsule Take 1 capsule (100 mg total) by mouth 2 (two) times daily. (Patient not taking: Reported on 06/01/2019) 20 capsule 0  . ibuprofen (ADVIL) 800 MG tablet Take 1 tablet (800 mg total) by mouth every 8 (eight) hours as needed for moderate pain. (Patient not taking: Reported on 06/01/2019) 21 tablet 0  . ondansetron (ZOFRAN) 4 MG tablet Take 1 tablet (4 mg total) by mouth every 6 (six) hours. (Patient not taking: Reported on 06/01/2019) 12 tablet 0  . triamcinolone cream (KENALOG) 0.1 % APPLY APPLICATION TOPICALLY 2 (TWO) TIMES DAILY. (Patient not taking: Reported on 06/01/2019) 30 g 0   No facility-administered medications prior to visit.     ROS Review of Systems  Constitutional: Positive for fatigue. Negative for activity change and appetite change.  HENT: Negative for congestion, sinus pressure and sore throat.   Eyes: Negative  for visual disturbance.  Respiratory: Positive for shortness of breath. Negative for cough, chest tightness and wheezing.   Cardiovascular: Negative for chest pain and palpitations.  Gastrointestinal: Negative for abdominal distention, abdominal pain and constipation.  Endocrine: Negative for polydipsia.  Genitourinary: Negative for dysuria and frequency.  Musculoskeletal: Positive for back pain. Negative for arthralgias.  Skin: Negative for rash.  Neurological: Negative for tremors, light-headedness and numbness.  Hematological: Bruises/bleeds easily.  Psychiatric/Behavioral: Negative for agitation and behavioral problems.    Objective:  BP 128/77   Pulse 71    Ht 5\' 11"  (1.803 m)   Wt 252 lb 6.4 oz (114.5 kg)   SpO2 100%   BMI 35.20 kg/m   BP/Weight 09/05/2019 06/01/2019 05/18/2019  Systolic BP 128 130 124  Diastolic BP 77 87 70  Wt. (Lbs) 252.4 244.8 242  BMI 35.2 34.14 33.75  Some encounter information is confidential and restricted. Go to Review Flowsheets activity to see all data.      Physical Exam Constitutional:      Appearance: She is well-developed.  Neck:     Vascular: No JVD.  Cardiovascular:     Rate and Rhythm: Normal rate.     Heart sounds: Normal heart sounds. No murmur heard.   Pulmonary:     Effort: Pulmonary effort is normal.     Breath sounds: Normal breath sounds. No wheezing or rales.  Chest:     Chest wall: No tenderness.  Abdominal:     General: Bowel sounds are normal. There is no distension.     Palpations: Abdomen is soft. There is no mass.     Tenderness: There is no abdominal tenderness.  Musculoskeletal:        General: No tenderness. Normal range of motion.     Right lower leg: No edema.     Left lower leg: No edema.     Comments: Negative Homans' sign bilaterally  Skin:    Findings: No bruising or erythema.     Comments: Bilateral varicose veins  Neurological:     Mental Status: She is alert and oriented to person, place, and time.  Psychiatric:        Mood and Affect: Mood normal.     CMP Latest Ref Rng & Units 06/01/2019 07/24/2018 05/19/2018  Glucose 65 - 99 mg/dL 86 87 88  BUN 6 - 20 mg/dL 16 14 10   Creatinine 0.57 - 1.00 mg/dL 07/19/2018 5.36  Sodium 134 - 144 mmol/L 138 139 137  Potassium 3.5 - 5.2 mmol/L 5.1 3.8 4.3  Chloride 96 - 106 mmol/L 104 107 103  CO2 20 - 29 mmol/L 22 23 19(L)  Calcium 8.7 - 10.2 mg/dL 6.44 9.5 9.4  Total Protein 6.5 - 8.1 g/dL - 7.4 -  Total Bilirubin 0.3 - 1.2 mg/dL - 0.7 -  Alkaline Phos 38 - 126 U/L - 49 -  AST 15 - 41 U/L - 20 -  ALT 0 - 44 U/L - 19 -    Lipid Panel     Component Value Date/Time   CHOL 124 10/26/2017 1003   TRIG 120 10/26/2017  1003   HDL 34 (L) 10/26/2017 1003   CHOLHDL 3.6 10/26/2017 1003   CHOLHDL 2.8 02/25/2016 1026   VLDL 17 02/25/2016 1026   LDLCALC 66 10/26/2017 1003    CBC    Component Value Date/Time   WBC 6.5 07/24/2018 1006   RBC 4.41 07/24/2018 1006   HGB 13.2 07/24/2018 1006   HGB 13.7  02/20/2017 1443   HCT 40.7 07/24/2018 1006   HCT 40.9 02/20/2017 1443   PLT 431 (H) 07/24/2018 1006   PLT 339 02/20/2017 1443   MCV 92.3 07/24/2018 1006   MCV 95 02/20/2017 1443   MCH 29.9 07/24/2018 1006   MCHC 32.4 07/24/2018 1006   RDW 14.5 07/24/2018 1006   RDW 14.1 02/20/2017 1443   LYMPHSABS 2.9 07/24/2018 1006   LYMPHSABS 3.6 (H) 02/20/2017 1443   MONOABS 0.6 07/24/2018 1006   EOSABS 0.1 07/24/2018 1006   EOSABS 0.1 02/20/2017 1443   BASOSABS 0.0 07/24/2018 1006   BASOSABS 0.0 02/20/2017 1443    Lab Results  Component Value Date   HGBA1C 5.5 02/20/2017    Assessment & Plan:  1. Varicose veins of bilateral lower extremities with other complications Advised to use compression stockings while awaiting vascular surgery referral - Ambulatory referral to Vascular Surgery  2. Essential hypertension Controlled Counseled on blood pressure goal of less than 130/80, low-sodium, DASH diet, medication compliance, 150 minutes of moderate intensity exercise per week. Discussed medication compliance, adverse effects. - hydrochlorothiazide (MICROZIDE) 12.5 MG capsule; Take 1 capsule (12.5 mg total) by mouth daily.  Dispense: 90 capsule; Refill: 1 - irbesartan (AVAPRO) 150 MG tablet; Take 1 tablet (150 mg total) by mouth daily.  Dispense: 90 tablet; Refill: 1 - Basic Metabolic Panel  3. Gastroesophageal reflux disease without esophagitis Controlled - pantoprazole (PROTONIX) 40 MG tablet; Take 1 tablet (40 mg total) by mouth daily.  Dispense: 90 tablet; Refill: 1  4. Bruising - PT AND PTT  5. Family history of thromboembolic disease - CBC with Differential/Platelet - D-dimer, quantitative (not at  [facility]) - Protein C, total - Protein S, total and free - Lupus anticoagulant panel - C-reactive protein - Fibrinogen - Homocysteine  6. Other form of dyspnea Her oxygen saturation is normal and she does not appear dyspneic on my exam. She has been reassured that this could be most likely due to her weight and she may need to commence a gradual exercise regimen I have assured her that we will proceed you hypercoagulable and thrombo embolic work-up in order to exclude any side effects from the vaccine and should she continue to feel poorly she should present to the emergency room. - DG Chest 2 View; Future  7. Other fatigue Vitamin D, thyroid were normal in 05/2019  42 minutes of total face to face time spent and greater than 50% of time spent on discussing diagnosis, investigations, treatment plan, counseling, coordination of care.   Return in about 3 months (around 12/06/2019), or if symptoms worsen or fail to improve, for Chronic disease management.      Charlott Rakes, MD, FAAFP. [facility] [place], [place]   09/05/2019, 4:41 PM

## 2019-09-05 NOTE — Progress Notes (Signed)
Patient is concerned about the veins in her legs.

## 2019-09-05 NOTE — Patient Instructions (Signed)
Varicose Veins Varicose veins are veins that have become enlarged, bulged, and twisted. They most often appear in the legs. What are the causes? This condition is caused by damage to the valves in the vein. These valves help blood return to your heart. When they are damaged and they stop working properly, blood may flow backward and back up in the veins near the skin, causing the veins to get larger and appear twisted. The condition can result from any issue that causes blood to back up, like pregnancy, prolonged standing, or obesity. What increases the risk? This condition is more likely to develop in people who are:  On their feet a lot.  Pregnant.  Overweight. What are the signs or symptoms? Symptoms of this condition include:  Bulging, twisted, and bluish veins.  A feeling of heaviness. This may be worse at the end of the day.  Leg pain. This may be worse at the end of the day.  Swelling in the leg.  Changes in skin color over the veins. How is this diagnosed? This condition may be diagnosed based on your symptoms, a physical exam, and an ultrasound test. How is this treated? Treatment for this condition may involve:  Avoiding sitting or standing in one position for long periods of time.  Wearing compression stockings. These stockings help to prevent blood clots and reduce swelling in the legs.  Raising (elevating) the legs when resting.  Losing weight.  Exercising regularly. If you have persistent symptoms or want to improve the way your varicose veins look, you may choose to have a procedure to close the varicose veins off or to remove them. Treatments to close off the veins include:  Sclerotherapy. In this treatment, a solution is injected into a vein to close it off.  Laser treatment. In this treatment, the vein is heated with a laser to close it off.  Radiofrequency vein ablation. In this treatment, an electrical current produced by radio waves is used to close  off the vein. Treatments to remove the veins include:  Phlebectomy. In this treatment, the veins are removed through small incisions made over the veins.  Vein ligation and stripping. In this treatment, incisions are made over the veins. The veins are then removed after being tied (ligated) with stitches (sutures). Follow these instructions at home: Activity  Walk as much as possible. Walking increases blood flow. This helps blood return to the heart and takes pressure off your veins. It also increases your cardiovascular strength.  Follow your health care provider's instructions about exercising.  Do not stand or sit in one position for a long period of time.  Do not sit with your legs crossed.  Rest with your legs raised during the day. General instructions   Follow any diet instructions given to you by your health care provider.  Wear compression stockings as directed by your health care provider. Do not wear other kinds of tight clothing around your legs, pelvis, or waist.  Elevate your legs at night to above the level of your heart.  If you get a cut in the skin over the varicose vein and the vein bleeds: ? Lie down with your leg raised. ? Apply firm pressure to the cut with a clean cloth until the bleeding stops. ? Place a bandage (dressing) on the cut. Contact a health care provider if:  The skin around your varicose veins starts to break down.  You have pain, redness, tenderness, or hard swelling over a vein.  You   are uncomfortable because of pain.  You get a cut in the skin over a varicose vein and it will not stop bleeding. Summary  Varicose veins are veins that have become enlarged, bulged, and twisted. They most often appear in the legs.  This condition is caused by damage to the valves in the vein. These valves help blood return to your heart.  Treatment for this condition includes frequent movements, wearing compression stockings, losing weight, and  exercising regularly. In some cases, procedures are done to close off or remove the veins.  Treatment for this condition may include wearing compression stockings, elevating the legs, losing weight, and engaging in regular activity. In some cases, procedures are done to close off or remove the veins. This information is not intended to replace advice given to you by your health care provider. Make sure you discuss any questions you have with your health care provider. Document Revised: 04/29/2018 Document Reviewed: 03/26/2016 Elsevier Patient Education  2020 Elsevier Inc.  

## 2019-09-06 ENCOUNTER — Encounter: Payer: Self-pay | Admitting: Family Medicine

## 2019-09-07 LAB — LUPUS ANTICOAGULANT PANEL
Dilute Viper Venom Time: 28.8 s (ref 0.0–47.0)
PTT Lupus Anticoagulant: 28.6 s (ref 0.0–51.9)

## 2019-09-07 LAB — CBC WITH DIFFERENTIAL/PLATELET
Basophils Absolute: 0 10*3/uL (ref 0.0–0.2)
Basos: 1 %
EOS (ABSOLUTE): 0.1 10*3/uL (ref 0.0–0.4)
Eos: 1 %
Hematocrit: 37.9 % (ref 34.0–46.6)
Hemoglobin: 12.8 g/dL (ref 11.1–15.9)
Immature Grans (Abs): 0 10*3/uL (ref 0.0–0.1)
Immature Granulocytes: 0 %
Lymphocytes Absolute: 4.6 10*3/uL — ABNORMAL HIGH (ref 0.7–3.1)
Lymphs: 52 %
MCH: 30.8 pg (ref 26.6–33.0)
MCHC: 33.8 g/dL (ref 31.5–35.7)
MCV: 91 fL (ref 79–97)
Monocytes Absolute: 0.8 10*3/uL (ref 0.1–0.9)
Monocytes: 9 %
Neutrophils Absolute: 3.2 10*3/uL (ref 1.4–7.0)
Neutrophils: 37 %
Platelets: 373 10*3/uL (ref 150–450)
RBC: 4.16 x10E6/uL (ref 3.77–5.28)
RDW: 13 % (ref 11.7–15.4)
WBC: 8.6 10*3/uL (ref 3.4–10.8)

## 2019-09-07 LAB — PROTEIN C, TOTAL: Protein C Antigen: 122 % (ref 60–150)

## 2019-09-07 LAB — HOMOCYSTEINE: Homocysteine: 7.4 umol/L (ref 0.0–14.5)

## 2019-09-07 LAB — PT AND PTT
INR: 0.9 (ref 0.9–1.2)
Prothrombin Time: 10.1 s (ref 9.1–12.0)
aPTT: 25 s (ref 24–33)

## 2019-09-07 LAB — BASIC METABOLIC PANEL
BUN/Creatinine Ratio: 14 (ref 9–23)
BUN: 14 mg/dL (ref 6–20)
CO2: 26 mmol/L (ref 20–29)
Calcium: 9.3 mg/dL (ref 8.7–10.2)
Chloride: 102 mmol/L (ref 96–106)
Creatinine, Ser: 0.99 mg/dL (ref 0.57–1.00)
GFR calc Af Amer: 84 mL/min/{1.73_m2} (ref >59)
GFR calc non Af Amer: 73 mL/min/{1.73_m2} (ref >59)
Glucose: 64 mg/dL — ABNORMAL LOW (ref 65–99)
Potassium: 3.9 mmol/L (ref 3.5–5.2)
Sodium: 141 mmol/L (ref 134–144)

## 2019-09-07 LAB — FIBRINOGEN: Fibrinogen: 408 mg/dL (ref 193–507)

## 2019-09-07 LAB — D-DIMER, QUANTITATIVE: D-DIMER: 0.49 mg/L FEU (ref 0.00–0.49)

## 2019-09-07 LAB — C-REACTIVE PROTEIN: CRP: 8 mg/L (ref 0–10)

## 2019-09-07 LAB — PROTEIN S, TOTAL AND FREE
Protein S Ag, Free: 61 % (ref 57–157)
Protein S Ag, Total: 83 % (ref 60–150)

## 2019-09-21 ENCOUNTER — Ambulatory Visit: Payer: Medicaid Other | Attending: Internal Medicine

## 2019-09-21 DIAGNOSIS — Z20822 Contact with and (suspected) exposure to covid-19: Secondary | ICD-10-CM | POA: Diagnosis not present

## 2019-09-22 LAB — NOVEL CORONAVIRUS, NAA: SARS-CoV-2, NAA: NOT DETECTED

## 2019-09-22 LAB — SARS-COV-2, NAA 2 DAY TAT

## 2019-09-28 MED FILL — PANTOPRAZOLE SOD DR 40 MG T: 40 | 30 days supply | Qty: 30 | Fill #4

## 2019-10-06 MED FILL — HYDROCHLOROTHIAZIDE 12.5 MG: 12.5 | 30 days supply | Qty: 30 | Fill #4

## 2019-10-17 ENCOUNTER — Other Ambulatory Visit: Payer: Medicaid Other

## 2019-10-29 ENCOUNTER — Encounter (HOSPITAL_COMMUNITY): Payer: Self-pay

## 2019-10-29 ENCOUNTER — Other Ambulatory Visit: Payer: Self-pay

## 2019-10-29 ENCOUNTER — Emergency Department (HOSPITAL_COMMUNITY)
Admission: EM | Admit: 2019-10-29 | Discharge: 2019-10-29 | Disposition: A | Discharge status: Home / Self Care | Payer: Medicaid Other | Attending: Emergency Medicine | Admitting: Emergency Medicine

## 2019-10-29 ENCOUNTER — Encounter (HOSPITAL_COMMUNITY): Payer: Self-pay | Admitting: Emergency Medicine

## 2019-10-29 ENCOUNTER — Ambulatory Visit (HOSPITAL_COMMUNITY)
Admission: EM | Admit: 2019-10-29 | Discharge: 2019-10-29 | Disposition: A | Discharge status: Home / Self Care | Payer: HRSA Program | Attending: Physician Assistant | Admitting: Physician Assistant

## 2019-10-29 DIAGNOSIS — Z79899 Other long term (current) drug therapy: Secondary | ICD-10-CM | POA: Insufficient documentation

## 2019-10-29 DIAGNOSIS — J069 Acute upper respiratory infection, unspecified: Secondary | ICD-10-CM | POA: Diagnosis not present

## 2019-10-29 DIAGNOSIS — F419 Anxiety disorder, unspecified: Secondary | ICD-10-CM | POA: Insufficient documentation

## 2019-10-29 DIAGNOSIS — Z20822 Contact with and (suspected) exposure to covid-19: Secondary | ICD-10-CM | POA: Insufficient documentation

## 2019-10-29 DIAGNOSIS — Z87891 Personal history of nicotine dependence: Secondary | ICD-10-CM | POA: Diagnosis not present

## 2019-10-29 DIAGNOSIS — M109 Gout, unspecified: Secondary | ICD-10-CM | POA: Diagnosis not present

## 2019-10-29 DIAGNOSIS — K219 Gastro-esophageal reflux disease without esophagitis: Secondary | ICD-10-CM | POA: Insufficient documentation

## 2019-10-29 DIAGNOSIS — K589 Irritable bowel syndrome without diarrhea: Secondary | ICD-10-CM | POA: Diagnosis not present

## 2019-10-29 DIAGNOSIS — R0981 Nasal congestion: Secondary | ICD-10-CM | POA: Insufficient documentation

## 2019-10-29 DIAGNOSIS — I10 Essential (primary) hypertension: Secondary | ICD-10-CM | POA: Insufficient documentation

## 2019-10-29 DIAGNOSIS — F329 Major depressive disorder, single episode, unspecified: Secondary | ICD-10-CM | POA: Diagnosis not present

## 2019-10-29 DIAGNOSIS — E669 Obesity, unspecified: Secondary | ICD-10-CM | POA: Diagnosis not present

## 2019-10-29 DIAGNOSIS — Z5321 Procedure and treatment not carried out due to patient leaving prior to being seen by health care provider: Secondary | ICD-10-CM | POA: Insufficient documentation

## 2019-10-29 DIAGNOSIS — Z6836 Body mass index (BMI) 36.0-36.9, adult: Secondary | ICD-10-CM | POA: Insufficient documentation

## 2019-10-29 DIAGNOSIS — R509 Fever, unspecified: Secondary | ICD-10-CM | POA: Insufficient documentation

## 2019-10-29 MED ORDER — ACETAMINOPHEN 500 MG PO TABS
500.0000 mg | ORAL_TABLET | Freq: Four times a day (QID) | ORAL | 0 refills | Status: DC | PRN
Start: 2019-10-29 — End: 2020-04-23

## 2019-10-29 MED ORDER — BENZONATATE 100 MG PO CAPS
100.0000 mg | ORAL_CAPSULE | Freq: Three times a day (TID) | ORAL | 0 refills | Status: DC
Start: 2019-10-29 — End: 2020-03-14

## 2019-10-29 MED ORDER — FLUTICASONE PROPIONATE 50 MCG/ACT NA SUSP
1.0000 | Freq: Every day | NASAL | 0 refills | Status: DC
Start: 2019-10-29 — End: 2020-04-20

## 2019-10-29 MED ORDER — SALINE SPRAY 0.65 % NA SOLN: 1.0000 | NASAL | 0 refills | Status: DC | PRN

## 2019-10-29 MED ORDER — CEPACOL SORE THROAT 5.4 MG MT LOZG
1.0000 | LOZENGE | OROMUCOSAL | 0 refills | Status: DC | PRN
Start: 2019-10-29 — End: 2020-04-20

## 2019-10-29 NOTE — ED Triage Notes (Signed)
Pt. Stated, I've had nasal congestion for 3 days with fever off and on . Ive had a COVID test and my COVID vaccine.

## 2019-10-29 NOTE — ED Triage Notes (Signed)
Pt c/o HA, nasal congestion, productive cough w/green mucous, sore throatx1 wk. Pt denies dysphagia.

## 2019-10-29 NOTE — Discharge Instructions (Signed)
Take medications as prescribed  If not improving over the next 4 to 5 days, return to clinic or follow-up with your primary care  If severe symptoms of shortness of breath, high fevers or other concerning symptoms return or go to the emergency department  If your Covid-19 test is positive, you will receive a phone call from [facility] regarding your results. Negative test results are not called. Both positive and negative results area always visible on MyChart. If you do not have a MyChart account, sign up instructions are in your discharge papers.   Persons who are directed to care for themselves at home may discontinue isolation under the following conditions:   At least 10 days have passed since symptom onset and  At least 24 hours have passed without running a fever (this means without the use of fever-reducing medications) and  Other symptoms have improved.  Persons infected with COVID-19 who never develop symptoms may discontinue isolation and other precautions 10 days after the date of their first positive COVID-19 test.

## 2019-10-29 NOTE — ED Notes (Signed)
Pt left saying she going to urgent care

## 2019-10-29 NOTE — ED Provider Notes (Signed)
[facility]    CSN: [id number] Arrival date & time: 10/29/19  1516      History   Chief Complaint Chief Complaint  Patient presents with  . COVID sx    HPI Elyssa Forcier is a 39 y.o. female.   Patient presents for evaluation of cough, nasal congestion and sore throat for the last 5 days or so.  She reports cough has been occasionally productive with green sputum.  She has also had some nasal discharge.  Mild sore throat.  No fevers or chills.  Feels relatively okay.  Eating and drinking per usual.  No shortness of breath or chest pain.  No vomiting or diarrhea.  She was at a funeral 9 to 10 days ago and is concerned about Covid.     Past Medical History:  Diagnosis Date  . Anxiety   . Depression   . Gout   . Hypertension     Patient Active Problem List   Diagnosis Date Noted  . Family history of breast cancer in mother 05/11/2017  . Obese 04/03/2017  . Hypertension 02/20/2017  . Irritable bowel syndrome 02/20/2017  . GERD (gastroesophageal reflux disease) 02/20/2017    Past Surgical History:  Procedure Laterality Date  . CESAREAN SECTION    . CESAREAN SECTION    . CHOLECYSTECTOMY    . TUBAL LIGATION      OB History    Gravida  5   Para  2   Term      Preterm      AB  3   Living  2     SAB      TAB  3   Ectopic      Multiple      Live Births  2            Home Medications    Prior to Admission medications   Medication Sig Start Date End Date Taking? Authorizing Provider  acetaminophen (TYLENOL) 500 MG tablet Take 1 tablet (500 mg total) by mouth every 6 (six) hours as needed. 10/29/19   Seung Nidiffer, Veryl Speak, PA-C  benzonatate (TESSALON) 100 MG capsule Take 1 capsule (100 mg total) by mouth every 8 (eight) hours. 10/29/19   Rosalin Buster, Veryl Speak, PA-C  doxycycline (VIBRAMYCIN) 100 MG capsule Take 1 capsule (100 mg total) by mouth 2 (two) times daily. Patient not taking: Reported on 06/01/2019 05/18/19   Moshe Cipro, NP  fluticasone  [facility]) 50 MCG/ACT nasal spray Place 1 spray into both nostrils daily. 10/29/19   Edom Schmuhl, Veryl Speak, PA-C  hydrochlorothiazide (MICROZIDE) 12.5 MG capsule Take 1 capsule (12.5 mg total) by mouth daily. 09/05/19   Hoy Register, MD  hydrOXYzine (ATARAX/VISTARIL) 10 MG tablet TAKE 1-2 TABLETS BY MOUTH ONCE DAILY AS NEEDED FOR ANZIETY 04/18/19   Hoy Register, MD  ibuprofen (ADVIL) 800 MG tablet Take 1 tablet (800 mg total) by mouth every 8 (eight) hours as needed for moderate pain. Patient not taking: Reported on 06/01/2019 05/18/19   Moshe Cipro, NP  irbesartan (AVAPRO) 150 MG tablet Take 1 tablet (150 mg total) by mouth daily. 09/05/19   Hoy Register, MD  Menthol (CEPACOL SORE THROAT) 5.4 MG LOZG Use as directed 1 lozenge (5.4 mg total) in the mouth or throat every 2 (two) hours as needed. 10/29/19   Nashawn Hillock, Veryl Speak, PA-C  ondansetron (ZOFRAN) 4 MG tablet Take 1 tablet (4 mg total) by mouth every 6 (six) hours. Patient not taking: Reported on 06/01/2019 05/18/19  Moshe Cipro, NP  pantoprazole (PROTONIX) 40 MG tablet Take 1 tablet (40 mg total) by mouth daily. 09/05/19   Hoy Register, MD  sodium chloride (OCEAN) 0.65 % SOLN nasal spray Place 1 spray into both nostrils as needed for congestion. 10/29/19   Giacomo Valone, Veryl Speak, PA-C  triamcinolone cream (KENALOG) 0.1 % APPLY APPLICATION TOPICALLY 2 (TWO) TIMES DAILY. Patient not taking: Reported on 06/01/2019 01/12/19   Hoy Register, MD  varenicline (CHANTIX CONTINUING MONTH PAK) 1 MG tablet Take 1 tablet (1 mg total) by mouth 2 (two) times daily. 06/01/19   Anders Simmonds, PA-C  varenicline (CHANTIX) 0.5 MG tablet Take 1 tablet (0.5 mg total) by mouth 2 (two) times daily. 06/01/19   Anders Simmonds, PA-C    Family History Family History  Problem Relation Age of Onset  . Cancer Mother   . Drug abuse Mother   . Bipolar disorder Mother   . Breast cancer Mother 61  . Diabetes Father   . Hypertension Other   . Anxiety disorder Sister   .  Drug abuse Brother     Social History Social History   Tobacco Use  . Smoking status: Former Games developer  . Smokeless tobacco: Never Used  Substance Use Topics  . Alcohol use: No  . Drug use: No     Allergies   Patient has no known allergies.   Review of Systems Review of Systems   Physical Exam Triage Vital Signs ED Triage Vitals  Enc Vitals Group     BP 10/29/19 1559 (!) 141/78     Pulse Rate 10/29/19 1559 66     Resp 10/29/19 1559 16     Temp 10/29/19 1559 98.1 F (36.7 C)     Temp Source 10/29/19 1559 Oral     SpO2 10/29/19 1559 100 %     Weight 10/29/19 1600 233 lb (105.7 kg)     Height 10/29/19 1600 5\' 11"  (1.803 m)     Head Circumference --      Peak Flow --      Pain Score 10/29/19 1600 0     Pain Loc --      Pain Edu? --      Excl. in GC? --    No data found.  Updated Vital Signs BP (!) 141/78   Pulse 66   Temp 98.1 F (36.7 C) (Oral)   Resp 16   Ht 5\' 11"  (1.803 m)   Wt 233 lb (105.7 kg)   SpO2 100%   BMI 32.50 kg/m   Visual Acuity Right Eye Distance:   Left Eye Distance:   Bilateral Distance:    Right Eye Near:   Left Eye Near:    Bilateral Near:     Physical Exam Vitals and nursing note reviewed.  Constitutional:      General: She is not in acute distress.    Appearance: She is well-developed. She is not ill-appearing.  HENT:     Head: Normocephalic and atraumatic.     Nose: Congestion and rhinorrhea present.     Mouth/Throat:     Mouth: Mucous membranes are moist.     Pharynx: No posterior oropharyngeal erythema.  Eyes:     Extraocular Movements: Extraocular movements intact.     Conjunctiva/sclera: Conjunctivae normal.     Pupils: Pupils are equal, round, and reactive to light.  Cardiovascular:     Rate and Rhythm: Normal rate and regular rhythm.     Heart sounds: No murmur heard.  Pulmonary:     Effort: Pulmonary effort is normal. No respiratory distress.     Breath sounds: Normal breath sounds. No stridor. No wheezing,  rhonchi or rales.  Abdominal:     Palpations: Abdomen is soft.     Tenderness: There is no abdominal tenderness.  Musculoskeletal:     Cervical back: Neck supple.  Skin:    General: Skin is warm and dry.  Neurological:     Mental Status: She is alert.      UC Treatments / Results  Labs (all labs ordered are listed, but only abnormal results are displayed) Labs Reviewed  SARS CORONAVIRUS 2 (TAT 6-24 HRS)    EKG   Radiology No results found.  Procedures Procedures (including critical care time)  Medications Ordered in UC Medications - No data to display  Initial Impression / Assessment and Plan / UC Course  I have reviewed the triage vital signs and the nursing notes.  Pertinent labs & imaging results that were available during my care of the patient were reviewed by me and considered in my medical decision making (see chart for details).     #Viral URI with cough Patient is 39 year old presenting with viral URI with cough.  Normal vital signs and reassuring exam in clinic.  Will send Covid.  Symptomatic management.  Return and follow-up precautions discussed.  Emergency department precautions discussed.  Patient verbalized understanding plan of care.  Final Clinical Impressions(s) / UC Diagnoses   Final diagnoses:  Viral URI with cough     Discharge Instructions     Take medications as prescribed  If not improving over the next 4 to 5 days, return to clinic or follow-up with your primary care  If severe symptoms of shortness of breath, high fevers or other concerning symptoms return or go to the emergency department  If your Covid-19 test is positive, you will receive a phone call from [facility] regarding your results. Negative test results are not called. Both positive and negative results area always visible on MyChart. If you do not have a MyChart account, sign up instructions are in your discharge papers.   Persons who are directed to care for  themselves at home may discontinue isolation under the following conditions:  . At least 10 days have passed since symptom onset and . At least 24 hours have passed without running a fever (this means without the use of fever-reducing medications) and . Other symptoms have improved.  Persons infected with COVID-19 who never develop symptoms may discontinue isolation and other precautions 10 days after the date of their first positive COVID-19 test.       ED Prescriptions    Medication Sig Dispense Auth. Provider   fluticasone (FLONASE) 50 MCG/ACT nasal spray Place 1 spray into both nostrils daily. 15.8 mL Lucerito Rosinski, Veryl Speak, PA-C   benzonatate (TESSALON) 100 MG capsule Take 1 capsule (100 mg total) by mouth every 8 (eight) hours. 21 capsule Jane Broughton, Veryl Speak, PA-C   Menthol (CEPACOL SORE THROAT) 5.4 MG LOZG Use as directed 1 lozenge (5.4 mg total) in the mouth or throat every 2 (two) hours as needed. 30 lozenge Braelin Brosch, Veryl Speak, PA-C   acetaminophen (TYLENOL) 500 MG tablet Take 1 tablet (500 mg total) by mouth every 6 (six) hours as needed. 30 tablet Ramonia Mcclaran, Veryl Speak, PA-C   sodium chloride (OCEAN) 0.65 % SOLN nasal spray Place 1 spray into both nostrils as needed for congestion. 60 mL Shirle Provencal, Veryl Speak, PA-C  PDMP not reviewed this encounter.   Hermelinda Medicusarr, Randee Upchurch E, PA-C 10/30/19 [phone]

## 2019-10-30 LAB — SARS CORONAVIRUS 2 (TAT 6-24 HRS): SARS Coronavirus 2: NEGATIVE

## 2019-10-31 MED FILL — IRBESARTAN 150 MG TABLET: 150 | 30 days supply | Qty: 30 | Fill #0

## 2019-11-02 MED FILL — PANTOPRAZOLE SOD DR 40 MG T: 40 | 30 days supply | Qty: 30 | Fill #5

## 2019-11-02 MED FILL — HYDROCHLOROTHIAZIDE 12.5 MG: 12.5 | 30 days supply | Qty: 30 | Fill #5

## 2019-11-09 ENCOUNTER — Other Ambulatory Visit: Payer: Self-pay

## 2019-11-09 ENCOUNTER — Ambulatory Visit (HOSPITAL_COMMUNITY)
Admission: EM | Admit: 2019-11-09 | Discharge: 2019-11-09 | Discharge status: Against Medical Advice | Payer: Medicaid Other

## 2019-11-09 NOTE — ED Notes (Addendum)
Pt not in lobby after 3 calls.

## 2019-11-11 DIAGNOSIS — Z20822 Contact with and (suspected) exposure to covid-19: Secondary | ICD-10-CM | POA: Diagnosis not present

## 2019-11-30 ENCOUNTER — Other Ambulatory Visit: Payer: Self-pay | Admitting: Physician Assistant

## 2019-11-30 DIAGNOSIS — K219 Gastro-esophageal reflux disease without esophagitis: Secondary | ICD-10-CM

## 2019-11-30 MED FILL — PANTOPRAZOLE SOD DR 40 MG T: 40 | 30 days supply | Qty: 30 | Fill #0

## 2019-11-30 MED FILL — IRBESARTAN 150 MG TABLET: 150 | 30 days supply | Qty: 30 | Fill #1

## 2019-12-06 MED FILL — HYDROCHLOROTHIAZIDE 12.5 MG: 12.5 | 30 days supply | Qty: 30 | Fill #0

## 2019-12-17 DIAGNOSIS — Z20822 Contact with and (suspected) exposure to covid-19: Secondary | ICD-10-CM | POA: Diagnosis not present

## 2019-12-30 MED FILL — HYDROCHLOROTHIAZIDE 12.5 MG: 12.5 | 30 days supply | Qty: 30 | Fill #1

## 2019-12-30 MED FILL — IRBESARTAN 150 MG TABLET: 150 | 30 days supply | Qty: 30 | Fill #2

## 2019-12-30 MED FILL — PANTOPRAZOLE SOD DR 40 MG T: 40 | 30 days supply | Qty: 30 | Fill #1

## 2020-01-02 ENCOUNTER — Encounter: Payer: Self-pay | Admitting: Family Medicine

## 2020-01-03 ENCOUNTER — Encounter: Payer: Self-pay | Admitting: Obstetrics and Gynecology

## 2020-01-03 ENCOUNTER — Other Ambulatory Visit (HOSPITAL_COMMUNITY)
Admission: RE | Admit: 2020-01-03 | Discharge: 2020-01-03 | Disposition: A | Discharge status: Home / Self Care | Payer: Medicaid Other | Source: Ambulatory Visit | Attending: Obstetrics and Gynecology | Admitting: Obstetrics and Gynecology

## 2020-01-03 ENCOUNTER — Other Ambulatory Visit: Payer: Self-pay

## 2020-01-03 ENCOUNTER — Ambulatory Visit (INDEPENDENT_AMBULATORY_CARE_PROVIDER_SITE_OTHER): Payer: Self-pay | Admitting: Obstetrics and Gynecology

## 2020-01-03 VITALS — BP 129/79 | HR 77 | Wt 251.6 lb

## 2020-01-03 DIAGNOSIS — R102 Pelvic and perineal pain unspecified side: Secondary | ICD-10-CM

## 2020-01-03 DIAGNOSIS — F32A Depression, unspecified: Secondary | ICD-10-CM

## 2020-01-03 DIAGNOSIS — Z01419 Encounter for gynecological examination (general) (routine) without abnormal findings: Secondary | ICD-10-CM | POA: Diagnosis not present

## 2020-01-03 NOTE — Progress Notes (Signed)
Subjective:     Melissa Cooley is a 39 y.o. female P2 with BMI 35 and LMP 12/11/19 who is here for a comprehensive physical exam. The patient reports pelvic pain which has been present for the past few months. Pain is worst with intercourse. Patient feels the pain as pressure type pain in her pelvis which radiates to her rectum. Patient states the pain takes a few hours to subside. Patient reports a monthly period lasting 4 days. She denies constipation or urinary incontinence. Patient is using BTL for contraception. She is without any other complaints  Past Medical History:  Diagnosis Date  . Anxiety   . Depression   . Gout   . Hypertension    Past Surgical History:  Procedure Laterality Date  . CESAREAN SECTION    . CESAREAN SECTION    . CHOLECYSTECTOMY    . TUBAL LIGATION     Family History  Problem Relation Age of Onset  . Cancer Mother   . Drug abuse Mother   . Bipolar disorder Mother   . Breast cancer Mother 70  . Diabetes Father   . Hypertension Other   . Anxiety disorder Sister   . Drug abuse Brother     Social History   Socioeconomic History  . Marital status: Single    Spouse name: Not on file  . Number of children: Not on file  . Years of education: Not on file  . Highest education level: Not on file  Occupational History  . Not on file  Tobacco Use  . Smoking status: Former Games developer  . Smokeless tobacco: Never Used  Substance and Sexual Activity  . Alcohol use: No  . Drug use: No  . Sexual activity: Yes    Birth control/protection: Surgical  Other Topics Concern  . Not on file  Social History Narrative  . Not on file   Social Determinants of Health   Financial Resource Strain:   . Difficulty of Paying Living Expenses: Not on file  Food Insecurity:   . Worried About Programme researcher, broadcasting/film/video in the Last Year: Not on file  . Ran Out of Food in the Last Year: Not on file  Transportation Needs:   . Lack of Transportation (Medical): Not on file  . Lack of  Transportation (Non-Medical): Not on file  Physical Activity:   . Days of Exercise per Week: Not on file  . Minutes of Exercise per Session: Not on file  Stress:   . Feeling of Stress : Not on file  Social Connections:   . Frequency of Communication with Friends and Family: Not on file  . Frequency of Social Gatherings with Friends and Family: Not on file  . Attends Religious Services: Not on file  . Active Member of Clubs or Organizations: Not on file  . Attends Banker Meetings: Not on file  . Marital Status: Not on file  Intimate Partner Violence:   . Fear of Current or Ex-Partner: Not on file  . Emotionally Abused: Not on file  . Physically Abused: Not on file  . Sexually Abused: Not on file   Health Maintenance  Topic Date Due  . INFLUENZA VACCINE  10/16/2019  . PAP SMEAR-Modifier  01/05/2022  . TETANUS/TDAP  09/04/2029  . COVID-19 Vaccine  Completed  . Hepatitis C Screening  Completed  . HIV Screening  Completed       Review of Systems Pertinent items noted in HPI and remainder of comprehensive ROS otherwise negative.  Objective:  Blood pressure 129/79, pulse 77, weight 251 lb 9.6 oz (114.1 kg), last menstrual period 12/11/2019.     GENERAL: Well-developed, well-nourished female in no acute distress.  HEENT: Normocephalic, atraumatic. Sclerae anicteric.  NECK: Supple. Normal thyroid.  LUNGS: Clear to auscultation bilaterally.  HEART: Regular rate and rhythm. BREASTS: Symmetric in size. No palpable masses or lymphadenopathy, skin changes, or nipple drainage. ABDOMEN: Soft, nontender, nondistended. No organomegaly. PELVIC: Normal external female genitalia. Vagina is pink and rugated.  Normal discharge. Normal appearing cervix. Uterus is normal in size. No adnexal mass or tenderness. EXTREMITIES: No cyanosis, clubbing, or edema, 2+ distal pulses.    Assessment:    Healthy female exam.      Plan:    Pap smear collected GC/Cl collected per patient  request BV, yeast ordered due to pelvic pain Pelvic ultrasound ordered Patient will be contacted with abnormal results See After Visit Summary for Counseling Recommendations

## 2020-01-04 LAB — CERVICOVAGINAL ANCILLARY ONLY
Bacterial Vaginitis (gardnerella): NEGATIVE
Candida Glabrata: NEGATIVE
Candida Vaginitis: NEGATIVE
Chlamydia: NEGATIVE
Comment: NEGATIVE
Comment: NEGATIVE
Comment: NEGATIVE
Comment: NEGATIVE
Comment: NEGATIVE
Comment: NORMAL
Neisseria Gonorrhea: NEGATIVE
Trichomonas: NEGATIVE

## 2020-01-06 LAB — CYTOLOGY - PAP
Adequacy: ABSENT
Diagnosis: NEGATIVE
Diagnosis: REACTIVE

## 2020-01-16 ENCOUNTER — Ambulatory Visit: Admission: RE | Admit: 2020-01-16 | Payer: Medicaid Other | Source: Ambulatory Visit

## 2020-01-17 ENCOUNTER — Institutional Professional Consult (permissible substitution): Payer: Medicaid Other | Admitting: Licensed Clinical Social Worker

## 2020-01-17 ENCOUNTER — Telehealth: Payer: Self-pay | Admitting: Licensed Clinical Social Worker

## 2020-01-17 NOTE — Telephone Encounter (Signed)
Melissa Cooley reports she is on her way to a funeral and requested today's visit to to reschedule

## 2020-01-24 ENCOUNTER — Ambulatory Visit (INDEPENDENT_AMBULATORY_CARE_PROVIDER_SITE_OTHER): Payer: Self-pay | Admitting: Licensed Clinical Social Worker

## 2020-01-24 DIAGNOSIS — F329 Major depressive disorder, single episode, unspecified: Secondary | ICD-10-CM

## 2020-01-24 DIAGNOSIS — F4310 Post-traumatic stress disorder, unspecified: Secondary | ICD-10-CM

## 2020-01-24 NOTE — BH Specialist Note (Signed)
Integrated Behavioral Health via Telemedicine Video (Caregility) Visit  01/24/2020 Melissa Cooley [id number]  Number of Integrated Behavioral Health visits: 1 Session Start time: 9:06am  Session End time: 9:47am Total time: 41 minutes via mychart   Referring Provider: Chana Bode MD Type of Service: Individual Patient/Family location: Home  [facility] Provider location: [facility]  All persons participating in visit: LCSWA A. Nathasha Fiorillo and [name]    I connected with Balinda Rutten and/or Nereida Pemberton's n/a  by a video enabled telemedicine application (Caregility) and verified that I am speaking with the correct person using two identifiers.   Discussed confidentiality: yes  Confirmed demographics & insurance:  No   I discussed that engaging in this virtual visit, they consent to the provision of behavioral healthcare and the services will be billed under their insurance.   Patient and/or legal guardian expressed understanding and consented to virtual visit: yes  PRESENTING CONCERNS: Patient and/or family reports the following symptoms/concerns: intense worry, increase irritability, family stress, fleeting thoughts, feelings of guilt, angry  Duration of problem: years ; Severity of problem: moderate   STRENGTHS (Protective Factors/Coping Skills): [name] reports feeling this way is not normal and seeking counseling to address trauma is a strenght   ASSESSMENT: Patient currently experiencing anxiety, ptsd and depression    GOALS ADDRESSED: Patient will: 1.  Reduce symptoms of: intense worry, fleeting thoughts, feelings of guilt   2.  Increase knowledge of diagnosis and implement coping skills to alleviate symptoms   3.  Demonstrate ability to: self manage symptoms    Progress of Goals: [name] appears motivated to begin therapy and practice interventions to boost mood and effective manage mentioned symptoms   INTERVENTIONS: Interventions utilized:  Supportive and cbt therapy   Standardized Assessments completed & reviewed:    [facility]  PHQ-9 Total Score 8       PLAN: 1. Follow up with behavioral health clinician on : 3 weeks in person or mychart  2. Behavioral recommendations: Engage in self care and self reflection exercises. Encourage [name] to practice communication exercises with fiance to boost support   3. Referral(s) Counseling services   I discussed the assessment and treatment plan with the patient and/or parent/guardian. They were provided an opportunity to ask questions and all were answered. They agreed with the plan and demonstrated an understanding of the instructions.   They were advised to call back or seek an in-person evaluation as appropriate.  I discussed that the purpose of this visit is to provide behavioral health care while limiting exposure to the novel coronavirus.  Discussed there is a possibility of technology failure and discussed alternative modes of communication if that failure occurs.  Gwyndolyn Saxon

## 2020-01-27 MED FILL — IRBESARTAN 150 MG TABLET: 150 | 30 days supply | Qty: 30 | Fill #3

## 2020-02-01 MED FILL — HYDROCHLOROTHIAZIDE 12.5 MG: 12.5 | 30 days supply | Qty: 30 | Fill #2

## 2020-02-06 ENCOUNTER — Other Ambulatory Visit: Payer: Self-pay | Admitting: Family Medicine

## 2020-02-06 DIAGNOSIS — F411 Generalized anxiety disorder: Secondary | ICD-10-CM

## 2020-02-06 MED FILL — PANTOPRAZOLE SOD DR 40 MG T: 40 | 30 days supply | Qty: 30 | Fill #2

## 2020-02-07 MED FILL — hydrOXYzine HCL 10 MG TABS: 10 | 30 days supply | Qty: 60 | Fill #0

## 2020-02-14 ENCOUNTER — Encounter: Payer: Medicaid Other | Admitting: Licensed Clinical Social Worker

## 2020-02-17 DIAGNOSIS — Z20822 Contact with and (suspected) exposure to covid-19: Secondary | ICD-10-CM | POA: Diagnosis not present

## 2020-02-27 MED FILL — IRBESARTAN 150 MG TABLET: 150 | 30 days supply | Qty: 30 | Fill #4

## 2020-02-29 MED FILL — HYDROCHLOROTHIAZIDE 12.5 MG: 12.5 | 30 days supply | Qty: 30 | Fill #3

## 2020-03-12 ENCOUNTER — Other Ambulatory Visit: Payer: Medicaid Other

## 2020-03-12 DIAGNOSIS — Z20822 Contact with and (suspected) exposure to covid-19: Secondary | ICD-10-CM | POA: Diagnosis not present

## 2020-03-13 LAB — SARS-COV-2, NAA 2 DAY TAT

## 2020-03-13 LAB — NOVEL CORONAVIRUS, NAA: SARS-CoV-2, NAA: NOT DETECTED

## 2020-03-14 ENCOUNTER — Ambulatory Visit (INDEPENDENT_AMBULATORY_CARE_PROVIDER_SITE_OTHER): Payer: Self-pay

## 2020-03-14 ENCOUNTER — Encounter (HOSPITAL_COMMUNITY): Payer: Self-pay

## 2020-03-14 ENCOUNTER — Ambulatory Visit (HOSPITAL_COMMUNITY)
Admission: EM | Admit: 2020-03-14 | Discharge: 2020-03-14 | Disposition: A | Discharge status: Home / Self Care | Payer: Self-pay | Attending: Urgent Care | Admitting: Urgent Care

## 2020-03-14 ENCOUNTER — Other Ambulatory Visit (HOSPITAL_COMMUNITY): Payer: Self-pay | Admitting: Urgent Care

## 2020-03-14 ENCOUNTER — Other Ambulatory Visit: Payer: Self-pay

## 2020-03-14 DIAGNOSIS — R059 Cough, unspecified: Secondary | ICD-10-CM

## 2020-03-14 DIAGNOSIS — J019 Acute sinusitis, unspecified: Secondary | ICD-10-CM

## 2020-03-14 DIAGNOSIS — R0989 Other specified symptoms and signs involving the circulatory and respiratory systems: Secondary | ICD-10-CM

## 2020-03-14 DIAGNOSIS — R0981 Nasal congestion: Secondary | ICD-10-CM

## 2020-03-14 MED ORDER — BENZONATATE 100 MG PO CAPS: 100.0000 mg | ORAL_CAPSULE | Freq: Three times a day (TID) | ORAL | 0 refills | Status: DC | PRN

## 2020-03-14 MED ORDER — CETIRIZINE HCL 10 MG PO TABS: 10.0000 mg | ORAL_TABLET | Freq: Every day | ORAL | 0 refills | Status: DC

## 2020-03-14 MED ORDER — AMOXICILLIN 875 MG PO TABS: 875.0000 mg | ORAL_TABLET | Freq: Two times a day (BID) | ORAL | 0 refills | Status: DC

## 2020-03-14 MED ORDER — PREDNISONE 20 MG PO TABS
ORAL_TABLET | ORAL | 0 refills | Status: DC
Start: 2020-03-14 — End: 2020-04-23

## 2020-03-14 MED ORDER — PROMETHAZINE-DM 6.25-15 MG/5ML PO SYRP
5.0000 mL | ORAL_SOLUTION | Freq: Every evening | ORAL | 0 refills | Status: DC | PRN
Start: 2020-03-14 — End: 2020-04-23

## 2020-03-14 MED FILL — BENZONATATE 100 MG CAPS: 100 | 10 days supply | Qty: 60 | Fill #0

## 2020-03-14 MED FILL — PANTOPRAZOLE SOD DR 40 MG T: 40 | 30 days supply | Qty: 30 | Fill #3

## 2020-03-14 MED FILL — AMOXICILLIN 875 MG TABS: 875 | 7 days supply | Qty: 14 | Fill #0

## 2020-03-14 MED FILL — predniSONE 20 MG TABS: 20 | 5 days supply | Qty: 10 | Fill #0

## 2020-03-14 MED FILL — PROMETHAZINE-DM 6.25-15 MG/: 6.25-15 | 20 days supply | Qty: 100 | Fill #0

## 2020-03-14 NOTE — ED Provider Notes (Signed)
[facility]   MRN: [id number] DOB: 18-Feb-1981  Subjective:   Melissa Cooley is a 39 y.o. female presenting for 8-day history of persistent malaise fatigue, productive cough, chest congestion.  Patient was tested for COVID-19 and was negative.  She did have exposure but has already completed her testing.  Denies history of asthma, respiratory disorders.  She is requesting a Z-Pak.  No current facility-administered medications for this encounter.  Current Outpatient Medications:    acetaminophen (TYLENOL) 500 MG tablet, Take 1 tablet (500 mg total) by mouth every 6 (six) hours as needed., Disp: 30 tablet, Rfl: 0   benzonatate (TESSALON) 100 MG capsule, Take 1 capsule (100 mg total) by mouth every 8 (eight) hours. (Patient not taking: Reported on 01/03/2020), Disp: 21 capsule, Rfl: 0   doxycycline (VIBRAMYCIN) 100 MG capsule, Take 1 capsule (100 mg total) by mouth 2 (two) times daily. (Patient not taking: Reported on 06/01/2019), Disp: 20 capsule, Rfl: 0   fluticasone (FLONASE) 50 MCG/ACT nasal spray, Place 1 spray into both nostrils daily. (Patient not taking: Reported on 01/03/2020), Disp: 15.8 mL, Rfl: 0   hydrochlorothiazide (MICROZIDE) 12.5 MG capsule, Take 1 capsule (12.5 mg total) by mouth daily., Disp: 90 capsule, Rfl: 1   hydrOXYzine (ATARAX/VISTARIL) 10 MG tablet, TAKE 1-2 TABLETS BY MOUTH ONCE DAILY AS NEEDED FOR ANZIETY, Disp: 60 tablet, Rfl: 0   ibuprofen (ADVIL) 800 MG tablet, Take 1 tablet (800 mg total) by mouth every 8 (eight) hours as needed for moderate pain., Disp: 21 tablet, Rfl: 0   irbesartan (AVAPRO) 150 MG tablet, Take 1 tablet (150 mg total) by mouth daily., Disp: 90 tablet, Rfl: 1   Menthol (CEPACOL SORE THROAT) 5.4 MG LOZG, Use as directed 1 lozenge (5.4 mg total) in the mouth or throat every 2 (two) hours as needed. (Patient not taking: Reported on 01/03/2020), Disp: 30 lozenge, Rfl: 0   ondansetron (ZOFRAN) 4 MG tablet, Take 1 tablet (4 mg  total) by mouth every 6 (six) hours. (Patient not taking: Reported on 06/01/2019), Disp: 12 tablet, Rfl: 0   pantoprazole (PROTONIX) 40 MG tablet, Take 1 tablet (40 mg total) by mouth daily., Disp: 90 tablet, Rfl: 1   sodium chloride (OCEAN) 0.65 % SOLN nasal spray, Place 1 spray into both nostrils as needed for congestion. (Patient not taking: Reported on 01/03/2020), Disp: 60 mL, Rfl: 0   triamcinolone cream (KENALOG) 0.1 %, APPLY APPLICATION TOPICALLY 2 (TWO) TIMES DAILY. (Patient not taking: Reported on 06/01/2019), Disp: 30 g, Rfl: 0   varenicline (CHANTIX CONTINUING MONTH PAK) 1 MG tablet, Take 1 tablet (1 mg total) by mouth 2 (two) times daily. (Patient not taking: Reported on 01/03/2020), Disp: 60 tablet, Rfl: 2   varenicline (CHANTIX) 0.5 MG tablet, Take 1 tablet (0.5 mg total) by mouth 2 (two) times daily., Disp: 60 tablet, Rfl: 0   No Known Allergies  Past Medical History:  Diagnosis Date   Anxiety    Depression    Gout    Hypertension      Past Surgical History:  Procedure Laterality Date   CESAREAN SECTION     CESAREAN SECTION     CHOLECYSTECTOMY     TUBAL LIGATION      Family History  Problem Relation Age of Onset   Cancer Mother    Drug abuse Mother    Bipolar disorder Mother    Breast cancer Mother 57   Diabetes Father    Hypertension Other    Anxiety  disorder Sister    Drug abuse Brother     Social History   Tobacco Use   Smoking status: Former Smoker   Smokeless tobacco: Never Used  Substance Use Topics   Alcohol use: No   Drug use: No    ROS   Objective:   Vitals: BP 136/61 (BP Location: Left Arm)    Pulse 64    Resp 20    LMP 02/25/2020 (Exact Date)    SpO2 98%   Physical Exam Constitutional:      General: She is not in acute distress.    Appearance: Normal appearance. She is well-developed. She is obese. She is not ill-appearing, toxic-appearing or diaphoretic.  HENT:     Head: Normocephalic and atraumatic.      Nose: Nose normal.     Mouth/Throat:     Mouth: Mucous membranes are moist.  Eyes:     Extraocular Movements: Extraocular movements intact.     Pupils: Pupils are equal, round, and reactive to light.  Cardiovascular:     Rate and Rhythm: Normal rate and regular rhythm.     Pulses: Normal pulses.     Heart sounds: Normal heart sounds. No murmur heard. No friction rub. No gallop.   Pulmonary:     Effort: Pulmonary effort is normal. No respiratory distress.     Breath sounds: Normal breath sounds. No stridor. No wheezing, rhonchi or rales.  Skin:    General: Skin is warm and dry.     Findings: No rash.  Neurological:     Mental Status: She is alert and oriented to person, place, and time.  Psychiatric:        Mood and Affect: Mood normal.        Behavior: Behavior normal.        Thought Content: Thought content normal.     DG Chest 2 View  Result Date: 03/14/2020 CLINICAL DATA:  Cough and congestion for 8 days. EXAM: CHEST - 2 VIEW COMPARISON:  Chest x-ray 01/03/2016 FINDINGS: The cardiac silhouette, mediastinal and hilar contours are within normal limits and stable. The lungs are clear of an acute process. No infiltrates, edema or effusions. No pulmonary lesions. No pneumothorax. The bony thorax is intact. IMPRESSION: No acute cardiopulmonary findings. Electronically Signed   By: Rudie Meyer M.D.   On: 03/14/2020 09:27   Assessment and Plan :   PDMP not reviewed this encounter.  1. Acute sinusitis, recurrence not specified, unspecified location   2. Cough   3. Chest congestion     Will start empiric treatment for sinusitis with amoxicillin. Given severity of patient's symptoms will also use prednisone.  Recommended supportive care otherwise including the use of oral antihistamine. Counseled patient on potential for adverse effects with medications prescribed/recommended today, ER and return-to-clinic precautions discussed, patient verbalized understanding.    Wallis Bamberg,  [place] 03/14/20 [phone]

## 2020-03-14 NOTE — ED Triage Notes (Signed)
Pt in with c/o productive cough and chest congestion that has been going on for 8 days now.  states that she's coughing so much and it's making her dizzy  Had a covid test with negative results

## 2020-03-17 DIAGNOSIS — Z87442 Personal history of urinary calculi: Secondary | ICD-10-CM

## 2020-03-17 HISTORY — DX: Personal history of urinary calculi: Z87.442

## 2020-03-30 MED FILL — IRBESARTAN 150 MG TABLET: 150 | 30 days supply | Qty: 30 | Fill #5

## 2020-03-30 MED FILL — HYDROCHLOROTHIAZIDE 12.5 MG: 12.5 | 30 days supply | Qty: 30 | Fill #4

## 2020-04-11 MED FILL — PANTOPRAZOLE SOD DR 40 MG T: 40 | 30 days supply | Qty: 30 | Fill #4

## 2020-04-19 ENCOUNTER — Other Ambulatory Visit: Payer: Self-pay

## 2020-04-19 ENCOUNTER — Encounter (HOSPITAL_COMMUNITY): Payer: Self-pay

## 2020-04-19 ENCOUNTER — Ambulatory Visit (INDEPENDENT_AMBULATORY_CARE_PROVIDER_SITE_OTHER)
Admission: EM | Admit: 2020-04-19 | Discharge: 2020-04-19 | Disposition: A | Discharge status: Home / Self Care | Payer: Medicaid Other | Source: Home / Self Care

## 2020-04-19 ENCOUNTER — Other Ambulatory Visit (HOSPITAL_COMMUNITY): Payer: Self-pay | Admitting: Emergency Medicine

## 2020-04-19 DIAGNOSIS — R6883 Chills (without fever): Secondary | ICD-10-CM | POA: Insufficient documentation

## 2020-04-19 DIAGNOSIS — M549 Dorsalgia, unspecified: Secondary | ICD-10-CM | POA: Insufficient documentation

## 2020-04-19 DIAGNOSIS — Z7952 Long term (current) use of systemic steroids: Secondary | ICD-10-CM | POA: Insufficient documentation

## 2020-04-19 DIAGNOSIS — I10 Essential (primary) hypertension: Secondary | ICD-10-CM | POA: Insufficient documentation

## 2020-04-19 DIAGNOSIS — R197 Diarrhea, unspecified: Secondary | ICD-10-CM

## 2020-04-19 DIAGNOSIS — Z79899 Other long term (current) drug therapy: Secondary | ICD-10-CM | POA: Insufficient documentation

## 2020-04-19 DIAGNOSIS — Z7901 Long term (current) use of anticoagulants: Secondary | ICD-10-CM | POA: Insufficient documentation

## 2020-04-19 DIAGNOSIS — Z87891 Personal history of nicotine dependence: Secondary | ICD-10-CM | POA: Insufficient documentation

## 2020-04-19 DIAGNOSIS — F419 Anxiety disorder, unspecified: Secondary | ICD-10-CM | POA: Insufficient documentation

## 2020-04-19 DIAGNOSIS — M109 Gout, unspecified: Secondary | ICD-10-CM | POA: Insufficient documentation

## 2020-04-19 DIAGNOSIS — Z20822 Contact with and (suspected) exposure to covid-19: Secondary | ICD-10-CM | POA: Insufficient documentation

## 2020-04-19 DIAGNOSIS — R112 Nausea with vomiting, unspecified: Secondary | ICD-10-CM

## 2020-04-19 DIAGNOSIS — B349 Viral infection, unspecified: Secondary | ICD-10-CM | POA: Insufficient documentation

## 2020-04-19 DIAGNOSIS — K589 Irritable bowel syndrome without diarrhea: Secondary | ICD-10-CM | POA: Insufficient documentation

## 2020-04-19 DIAGNOSIS — K219 Gastro-esophageal reflux disease without esophagitis: Secondary | ICD-10-CM | POA: Insufficient documentation

## 2020-04-19 DIAGNOSIS — F32A Depression, unspecified: Secondary | ICD-10-CM | POA: Insufficient documentation

## 2020-04-19 MED ORDER — IBUPROFEN 600 MG PO TABS
600.0000 mg | ORAL_TABLET | Freq: Four times a day (QID) | ORAL | 0 refills | Status: DC | PRN
Start: 2020-04-19 — End: 2020-04-23

## 2020-04-19 MED ORDER — ONDANSETRON HCL 4 MG PO TABS
4.0000 mg | ORAL_TABLET | Freq: Four times a day (QID) | ORAL | 0 refills | Status: DC | PRN
Start: 2020-04-19 — End: 2020-04-23

## 2020-04-19 MED FILL — ONDANSETRON HCL 4 MG TABLET: 4 | 3 days supply | Qty: 12 | Fill #0

## 2020-04-19 MED FILL — IBUPROFEN 600 MG TABLET: 600 | 7 days supply | Qty: 30 | Fill #0

## 2020-04-19 NOTE — ED Provider Notes (Signed)
[facility]    CSN: [id number] Arrival date & time: 04/19/20  1434      History   Chief Complaint Chief Complaint  Patient presents with  . Chills  . Nausea  . Emesis  . Back Pain    HPI Melissa Cooley is a 40 y.o. female.   Patient presents with body aches, chills, nausea, vomiting, diarrhea, back pain since this morning.  She denies fever, rash, sore throat, cough, shortness of breath, abdominal pain, dysuria, or other symptoms.  She states she was exposed to a family member with COVID 5 days ago.  Treatment attempted at home with Tylenol.  Her medical history includes hypertension, IBS, GERD, gout, depression, anxiety.  The history is provided by the patient and medical records.    Past Medical History:  Diagnosis Date  . Anxiety   . Depression   . Gout   . Hypertension     Patient Active Problem List   Diagnosis Date Noted  . Family history of breast cancer in mother 05/11/2017  . Obese 04/03/2017  . Hypertension 02/20/2017  . Irritable bowel syndrome 02/20/2017  . GERD (gastroesophageal reflux disease) 02/20/2017    Past Surgical History:  Procedure Laterality Date  . CESAREAN SECTION    . CESAREAN SECTION    . CHOLECYSTECTOMY    . TUBAL LIGATION      OB History    Gravida  5   Para  2   Term      Preterm      AB  3   Living  2     SAB      IAB  3   Ectopic      Multiple      Live Births  2            Home Medications    Prior to Admission medications   Medication Sig Start Date End Date Taking? Authorizing Provider  ibuprofen (ADVIL) 600 MG tablet Take 1 tablet (600 mg total) by mouth every 6 (six) hours as needed. 04/19/20  Yes Mickie Bail, NP  ondansetron (ZOFRAN) 4 MG tablet Take 1 tablet (4 mg total) by mouth every 6 (six) hours as needed for nausea or vomiting. 04/19/20  Yes Mickie Bail, NP  acetaminophen (TYLENOL) 500 MG tablet Take 1 tablet (500 mg total) by mouth every 6 (six) hours as needed. 10/29/19   Darr,  Gerilyn Pilgrim, PA-C  amoxicillin (AMOXIL) 875 MG tablet Take 1 tablet (875 mg total) by mouth 2 (two) times daily. 03/14/20   Wallis Bamberg, PA-C  benzonatate (TESSALON) 100 MG capsule Take 1-2 capsules (100-200 mg total) by mouth 3 (three) times daily as needed. 03/14/20   Wallis Bamberg, PA-C  cetirizine (ZYRTEC ALLERGY) 10 MG tablet Take 1 tablet (10 mg total) by mouth daily. 03/14/20   Wallis Bamberg, PA-C  doxycycline (VIBRAMYCIN) 100 MG capsule Take 1 capsule (100 mg total) by mouth 2 (two) times daily. Patient not taking: Reported on 06/01/2019 05/18/19   Moshe Cipro, NP  fluticasone [facility]) 50 MCG/ACT nasal spray Place 1 spray into both nostrils daily. Patient not taking: Reported on 01/03/2020 10/29/19   Darr, Gerilyn Pilgrim, PA-C  hydrochlorothiazide (MICROZIDE) 12.5 MG capsule Take 1 capsule (12.5 mg total) by mouth daily. 09/05/19   Hoy Register, MD  hydrOXYzine (ATARAX/VISTARIL) 10 MG tablet TAKE 1-2 TABLETS BY MOUTH ONCE DAILY AS NEEDED FOR ANZIETY 02/06/20   Hoy Register, MD  irbesartan (AVAPRO) 150 MG tablet Take 1 tablet (150  mg total) by mouth daily. 09/05/19   Hoy Register, MD  Menthol (CEPACOL SORE THROAT) 5.4 MG LOZG Use as directed 1 lozenge (5.4 mg total) in the mouth or throat every 2 (two) hours as needed. Patient not taking: Reported on 01/03/2020 10/29/19   Darr, Gerilyn Pilgrim, PA-C  pantoprazole (PROTONIX) 40 MG tablet Take 1 tablet (40 mg total) by mouth daily. 09/05/19   Hoy Register, MD  predniSONE (DELTASONE) 20 MG tablet Take 2 tablets daily with breakfast. 03/14/20   Wallis Bamberg, PA-C  promethazine-dextromethorphan (PROMETHAZINE-DM) 6.25-15 MG/5ML syrup Take 5 mLs by mouth at bedtime as needed for cough. 03/14/20   Wallis Bamberg, PA-C  sodium chloride (OCEAN) 0.65 % SOLN nasal spray Place 1 spray into both nostrils as needed for congestion. Patient not taking: Reported on 01/03/2020 10/29/19   Darr, Gerilyn Pilgrim, PA-C  triamcinolone cream (KENALOG) 0.1 % APPLY APPLICATION TOPICALLY 2 (TWO)  TIMES DAILY. Patient not taking: Reported on 06/01/2019 01/12/19   Hoy Register, MD  varenicline (CHANTIX CONTINUING MONTH PAK) 1 MG tablet Take 1 tablet (1 mg total) by mouth 2 (two) times daily. Patient not taking: Reported on 01/03/2020 06/01/19   Anders Simmonds, PA-C  varenicline (CHANTIX) 0.5 MG tablet Take 1 tablet (0.5 mg total) by mouth 2 (two) times daily. 06/01/19   Anders Simmonds, PA-C    Family History Family History  Problem Relation Age of Onset  . Cancer Mother   . Drug abuse Mother   . Bipolar disorder Mother   . Breast cancer Mother 47  . Diabetes Father   . Hypertension Other   . Anxiety disorder Sister   . Drug abuse Brother     Social History Social History   Tobacco Use  . Smoking status: Former Games developer  . Smokeless tobacco: Never Used  Substance Use Topics  . Alcohol use: No  . Drug use: No     Allergies   Patient has no known allergies.   Review of Systems Review of Systems  Constitutional: Positive for chills. Negative for fever.  HENT: Negative for ear pain and sore throat.   Eyes: Negative for pain and visual disturbance.  Respiratory: Negative for cough and shortness of breath.   Cardiovascular: Negative for chest pain and palpitations.  Gastrointestinal: Positive for diarrhea, nausea and vomiting. Negative for abdominal pain.  Genitourinary: Negative for dysuria and hematuria.  Musculoskeletal: Positive for back pain and myalgias. Negative for arthralgias.  Skin: Negative for color change and rash.  Neurological: Negative for seizures and syncope.  All other systems reviewed and are negative.    Physical Exam Triage Vital Signs ED Triage Vitals  Enc Vitals Group     BP 04/19/20 1505 139/87     Pulse Rate 04/19/20 1505 79     Resp 04/19/20 1505 19     Temp 04/19/20 1505 99.2 F (37.3 C)     Temp src --      SpO2 04/19/20 1505 100 %     Weight --      Height --      Head Circumference --      Peak Flow --      Pain  Score 04/19/20 1503 10     Pain Loc --      Pain Edu? --      Excl. in GC? --    No data found.  Updated Vital Signs BP 139/87   Pulse 79   Temp 99.2 F (37.3 C)   Resp 19  LMP 04/06/2020 (Approximate)   SpO2 100%   Visual Acuity Right Eye Distance:   Left Eye Distance:   Bilateral Distance:    Right Eye Near:   Left Eye Near:    Bilateral Near:     Physical Exam Vitals and nursing note reviewed.  Constitutional:      General: She is not in acute distress.    Appearance: She is well-developed and well-nourished. She is not ill-appearing.  HENT:     Head: Normocephalic and atraumatic.     Right Ear: Tympanic membrane normal.     Left Ear: Tympanic membrane normal.     Nose: Nose normal.     Mouth/Throat:     Mouth: Mucous membranes are moist.     Pharynx: Oropharynx is clear.  Eyes:     Conjunctiva/sclera: Conjunctivae normal.  Cardiovascular:     Rate and Rhythm: Normal rate and regular rhythm.     Heart sounds: Normal heart sounds.  Pulmonary:     Effort: Pulmonary effort is normal. No respiratory distress.     Breath sounds: Normal breath sounds.  Abdominal:     General: Bowel sounds are normal.     Palpations: Abdomen is soft.     Tenderness: There is no abdominal tenderness. There is no guarding or rebound.  Musculoskeletal:        General: No edema.     Cervical back: Neck supple.  Skin:    General: Skin is warm and dry.     Findings: No rash.  Neurological:     General: No focal deficit present.     Mental Status: She is alert and oriented to person, place, and time.     Gait: Gait normal.  Psychiatric:        Mood and Affect: Mood and affect and mood normal.        Behavior: Behavior normal.      UC Treatments / Results  Labs (all labs ordered are listed, but only abnormal results are displayed) Labs Reviewed  SARS CORONAVIRUS 2 (TAT 6-24 HRS)    EKG   Radiology No results found.  Procedures Procedures (including critical care  time)  Medications Ordered in UC Medications - No data to display  Initial Impression / Assessment and Plan / UC Course  I have reviewed the triage vital signs and the nursing notes.  Pertinent labs & imaging results that were available during my care of the patient were reviewed by me and considered in my medical decision making (see chart for details).   Viral illness, nausea, vomiting, diarrhea.  COVID pending.  Instructed patient to self quarantine until the test results are back.  Discussed symptomatic treatment including ibuprofen, rest, hydration.  Zofran as needed for nausea or vomiting.  Instructed patient to follow up with PCP if her symptoms are not improving.  Patient agrees to plan of care.    Final Clinical Impressions(s) / UC Diagnoses   Final diagnoses:  Viral illness  Nausea vomiting and diarrhea     Discharge Instructions     Your COVID test is pending.  You should self quarantine until the test result is back.    Take Tylenol or ibuprofen as needed for fever or discomfort.  Take Zofran as needed for nausea or vomiting. Rest and keep yourself hydrated.    Follow-up with your primary care provider if your symptoms are not improving.        ED Prescriptions    Medication Sig Dispense Auth.  Provider   ibuprofen (ADVIL) 600 MG tablet Take 1 tablet (600 mg total) by mouth every 6 (six) hours as needed. 30 tablet Mickie Bail, NP   ondansetron (ZOFRAN) 4 MG tablet Take 1 tablet (4 mg total) by mouth every 6 (six) hours as needed for nausea or vomiting. 12 tablet Mickie Bail, NP     PDMP not reviewed this encounter.   Mickie Bail, NP 04/19/20 [phone]

## 2020-04-19 NOTE — ED Triage Notes (Signed)
Pt in with c/o N/V/D, chills, and back pain that has been going since 9:30 am   Pt has been taking tylenol with no relief

## 2020-04-19 NOTE — Discharge Instructions (Addendum)
Your COVID test is pending.  You should self quarantine until the test result is back.    Take Tylenol or ibuprofen as needed for fever or discomfort.  Take Zofran as needed for nausea or vomiting. Rest and keep yourself hydrated.    Follow-up with your primary care provider if your symptoms are not improving.

## 2020-04-20 ENCOUNTER — Inpatient Hospital Stay (HOSPITAL_COMMUNITY)
Admission: EM | Admit: 2020-04-20 | Discharge: 2020-04-23 | DRG: 853 | Disposition: A | Discharge status: Home / Self Care | Payer: Medicaid Other | Attending: Internal Medicine | Admitting: Internal Medicine

## 2020-04-20 ENCOUNTER — Inpatient Hospital Stay (HOSPITAL_COMMUNITY): Payer: Medicaid Other | Admitting: Certified Registered Nurse Anesthetist

## 2020-04-20 ENCOUNTER — Emergency Department (HOSPITAL_COMMUNITY): Payer: Medicaid Other

## 2020-04-20 ENCOUNTER — Other Ambulatory Visit: Payer: Self-pay

## 2020-04-20 ENCOUNTER — Other Ambulatory Visit: Payer: Self-pay | Admitting: Urology

## 2020-04-20 ENCOUNTER — Encounter (HOSPITAL_COMMUNITY)
Admission: EM | Disposition: A | Discharge status: Home / Self Care | Payer: Self-pay | Source: Home / Self Care | Attending: Internal Medicine

## 2020-04-20 ENCOUNTER — Encounter (HOSPITAL_COMMUNITY): Payer: Self-pay | Admitting: Emergency Medicine

## 2020-04-20 ENCOUNTER — Inpatient Hospital Stay (HOSPITAL_COMMUNITY): Payer: Medicaid Other

## 2020-04-20 DIAGNOSIS — D72829 Elevated white blood cell count, unspecified: Secondary | ICD-10-CM | POA: Diagnosis not present

## 2020-04-20 DIAGNOSIS — Z452 Encounter for adjustment and management of vascular access device: Secondary | ICD-10-CM

## 2020-04-20 DIAGNOSIS — E669 Obesity, unspecified: Secondary | ICD-10-CM | POA: Diagnosis present

## 2020-04-20 DIAGNOSIS — R6521 Severe sepsis with septic shock: Secondary | ICD-10-CM | POA: Diagnosis not present

## 2020-04-20 DIAGNOSIS — Z803 Family history of malignant neoplasm of breast: Secondary | ICD-10-CM

## 2020-04-20 DIAGNOSIS — A4151 Sepsis due to Escherichia coli [E. coli]: Principal | ICD-10-CM | POA: Diagnosis present

## 2020-04-20 DIAGNOSIS — N200 Calculus of kidney: Secondary | ICD-10-CM | POA: Diagnosis present

## 2020-04-20 DIAGNOSIS — E860 Dehydration: Secondary | ICD-10-CM | POA: Diagnosis not present

## 2020-04-20 DIAGNOSIS — Z20822 Contact with and (suspected) exposure to covid-19: Secondary | ICD-10-CM | POA: Diagnosis not present

## 2020-04-20 DIAGNOSIS — N10 Acute tubulo-interstitial nephritis: Secondary | ICD-10-CM | POA: Diagnosis not present

## 2020-04-20 DIAGNOSIS — Z833 Family history of diabetes mellitus: Secondary | ICD-10-CM | POA: Diagnosis not present

## 2020-04-20 DIAGNOSIS — Z8619 Personal history of other infectious and parasitic diseases: Secondary | ICD-10-CM

## 2020-04-20 DIAGNOSIS — I10 Essential (primary) hypertension: Secondary | ICD-10-CM | POA: Diagnosis present

## 2020-04-20 DIAGNOSIS — N23 Unspecified renal colic: Secondary | ICD-10-CM

## 2020-04-20 DIAGNOSIS — E876 Hypokalemia: Secondary | ICD-10-CM | POA: Diagnosis not present

## 2020-04-20 DIAGNOSIS — N136 Pyonephrosis: Secondary | ICD-10-CM | POA: Diagnosis present

## 2020-04-20 DIAGNOSIS — N132 Hydronephrosis with renal and ureteral calculous obstruction: Secondary | ICD-10-CM | POA: Diagnosis not present

## 2020-04-20 DIAGNOSIS — K219 Gastro-esophageal reflux disease without esophagitis: Secondary | ICD-10-CM | POA: Diagnosis not present

## 2020-04-20 DIAGNOSIS — A419 Sepsis, unspecified organism: Secondary | ICD-10-CM | POA: Diagnosis present

## 2020-04-20 DIAGNOSIS — E872 Acidosis: Secondary | ICD-10-CM | POA: Diagnosis not present

## 2020-04-20 DIAGNOSIS — N390 Urinary tract infection, site not specified: Secondary | ICD-10-CM

## 2020-04-20 DIAGNOSIS — F418 Other specified anxiety disorders: Secondary | ICD-10-CM | POA: Diagnosis not present

## 2020-04-20 DIAGNOSIS — N201 Calculus of ureter: Secondary | ICD-10-CM | POA: Diagnosis not present

## 2020-04-20 DIAGNOSIS — R1032 Left lower quadrant pain: Secondary | ICD-10-CM | POA: Diagnosis not present

## 2020-04-20 DIAGNOSIS — N179 Acute kidney failure, unspecified: Secondary | ICD-10-CM | POA: Diagnosis not present

## 2020-04-20 DIAGNOSIS — Z87891 Personal history of nicotine dependence: Secondary | ICD-10-CM

## 2020-04-20 DIAGNOSIS — Z6836 Body mass index (BMI) 36.0-36.9, adult: Secondary | ICD-10-CM | POA: Diagnosis not present

## 2020-04-20 DIAGNOSIS — D649 Anemia, unspecified: Secondary | ICD-10-CM | POA: Diagnosis present

## 2020-04-20 HISTORY — PX: CYSTOSCOPY WITH RETROGRADE PYELOGRAM, URETEROSCOPY AND STENT PLACEMENT: SHX5789

## 2020-04-20 HISTORY — DX: Personal history of other infectious and parasitic diseases: Z86.19

## 2020-04-20 LAB — BASIC METABOLIC PANEL
Anion gap: 15 (ref 5–15)
BUN: 23 mg/dL — ABNORMAL HIGH (ref 6–20)
CO2: 16 mmol/L — ABNORMAL LOW (ref 22–32)
Calcium: 7.2 mg/dL — ABNORMAL LOW (ref 8.9–10.3)
Chloride: 107 mmol/L (ref 98–111)
Creatinine, Ser: 2.5 mg/dL — ABNORMAL HIGH (ref 0.44–1.00)
GFR, Estimated: 24 mL/min — ABNORMAL LOW (ref >60)
Glucose, Bld: 115 mg/dL — ABNORMAL HIGH (ref 70–99)
Potassium: 3 mmol/L — ABNORMAL LOW (ref 3.5–5.1)
Sodium: 138 mmol/L (ref 135–145)

## 2020-04-20 LAB — URINALYSIS, ROUTINE W REFLEX MICROSCOPIC
Bilirubin Urine: NEGATIVE
Glucose, UA: NEGATIVE mg/dL
Hgb urine dipstick: NEGATIVE
Ketones, ur: NEGATIVE mg/dL
Nitrite: POSITIVE — AB
Protein, ur: 30 mg/dL — AB
Specific Gravity, Urine: 1.024 (ref 1.005–1.030)
pH: 5 (ref 5.0–8.0)

## 2020-04-20 LAB — LIPASE, BLOOD: Lipase: 28 U/L (ref 11–51)

## 2020-04-20 LAB — CBC
HCT: 40 % (ref 36.0–46.0)
Hemoglobin: 13.9 g/dL (ref 12.0–15.0)
MCH: 31.4 pg (ref 26.0–34.0)
MCHC: 34.8 g/dL (ref 30.0–36.0)
MCV: 90.5 fL (ref 80.0–100.0)
Platelets: 301 10*3/uL (ref 150–400)
RBC: 4.42 MIL/uL (ref 3.87–5.11)
RDW: 14.2 % (ref 11.5–15.5)
WBC: 16.9 10*3/uL — ABNORMAL HIGH (ref 4.0–10.5)
nRBC: 0 % (ref 0.0–0.2)

## 2020-04-20 LAB — COMPREHENSIVE METABOLIC PANEL
ALT: 27 U/L (ref 0–44)
AST: 32 U/L (ref 15–41)
Albumin: 3.7 g/dL (ref 3.5–5.0)
Alkaline Phosphatase: 70 U/L (ref 38–126)
Anion gap: 13 (ref 5–15)
BUN: 15 mg/dL (ref 6–20)
CO2: 17 mmol/L — ABNORMAL LOW (ref 22–32)
Calcium: 9 mg/dL (ref 8.9–10.3)
Chloride: 104 mmol/L (ref 98–111)
Creatinine, Ser: 1.45 mg/dL — ABNORMAL HIGH (ref 0.44–1.00)
GFR, Estimated: 47 mL/min — ABNORMAL LOW (ref >60)
Glucose, Bld: 137 mg/dL — ABNORMAL HIGH (ref 70–99)
Potassium: 3.1 mmol/L — ABNORMAL LOW (ref 3.5–5.1)
Sodium: 134 mmol/L — ABNORMAL LOW (ref 135–145)
Total Bilirubin: 1.1 mg/dL (ref 0.3–1.2)
Total Protein: 7.5 g/dL (ref 6.5–8.1)

## 2020-04-20 LAB — LACTIC ACID, PLASMA
Lactic Acid, Venous: 4.8 mmol/L (ref 0.5–1.9)
Lactic Acid, Venous: 5.5 mmol/L (ref 0.5–1.9)

## 2020-04-20 LAB — SARS CORONAVIRUS 2 (TAT 6-24 HRS): SARS Coronavirus 2: NEGATIVE

## 2020-04-20 LAB — MAGNESIUM: Magnesium: 1.3 mg/dL — ABNORMAL LOW (ref 1.7–2.4)

## 2020-04-20 LAB — I-STAT BETA HCG BLOOD, ED (MC, WL, AP ONLY): I-stat hCG, quantitative: <5 m[IU]/mL (ref <5)

## 2020-04-20 SURGERY — CYSTOURETEROSCOPY, WITH RETROGRADE PYELOGRAM AND STENT INSERTION
Anesthesia: General | Laterality: Left

## 2020-04-20 MED ORDER — MAGNESIUM SULFATE 2 GM/50ML IV SOLN
2.0000 g | Freq: Once | INTRAVENOUS | Status: AC
  Administered 2020-04-20: 2 g via INTRAVENOUS
  Filled 2020-04-20: qty 50

## 2020-04-20 MED ORDER — PROPOFOL 10 MG/ML IV BOLUS
INTRAVENOUS | Status: DC | PRN
  Administered 2020-04-20: 200 mg via INTRAVENOUS

## 2020-04-20 MED ORDER — ONDANSETRON HCL 4 MG/2ML IJ SOLN
4.0000 mg | Freq: Once | INTRAMUSCULAR | Status: AC
  Administered 2020-04-20: 4 mg via INTRAVENOUS
  Filled 2020-04-20: qty 2

## 2020-04-20 MED ORDER — ALBUMIN HUMAN 5 % IV SOLN
INTRAVENOUS | Status: AC
  Administered 2020-04-20: 12.5 g via INTRAVENOUS
  Filled 2020-04-20: qty 500

## 2020-04-20 MED ORDER — VASOPRESSIN 20 UNIT/ML IV SOLN
INTRAVENOUS | Status: DC | PRN
  Administered 2020-04-20 (×3): .5 [IU] via INTRAVENOUS

## 2020-04-20 MED ORDER — SODIUM CHLORIDE 0.9 % IV SOLN: 250.0000 mL | INTRAVENOUS | Status: DC

## 2020-04-20 MED ORDER — LIDOCAINE HCL (PF) 2 % IJ SOLN
INTRAMUSCULAR | Status: AC
  Filled 2020-04-20: qty 5

## 2020-04-20 MED ORDER — PROMETHAZINE HCL 25 MG/ML IJ SOLN: 12.5000 mg | Freq: Once | INTRAMUSCULAR | 0 refills | Status: DC

## 2020-04-20 MED ORDER — LACTATED RINGERS IV SOLN: INTRAVENOUS | Status: DC

## 2020-04-20 MED ORDER — PROPOFOL 10 MG/ML IV BOLUS
INTRAVENOUS | Status: AC
  Filled 2020-04-20: qty 20

## 2020-04-20 MED ORDER — SODIUM CHLORIDE 0.9 % IV BOLUS
1000.0000 mL | Freq: Once | INTRAVENOUS | Status: AC
  Administered 2020-04-20: 1000 mL via INTRAVENOUS

## 2020-04-20 MED ORDER — LIDOCAINE HCL (CARDIAC) PF 100 MG/5ML IV SOSY
PREFILLED_SYRINGE | INTRAVENOUS | Status: DC | PRN
  Administered 2020-04-20: 100 mg via INTRAVENOUS

## 2020-04-20 MED ORDER — MIDAZOLAM HCL 2 MG/2ML IJ SOLN
INTRAMUSCULAR | Status: AC
  Filled 2020-04-20: qty 2

## 2020-04-20 MED ORDER — POTASSIUM CHLORIDE CRYS ER 20 MEQ PO TBCR: 40.0000 meq | EXTENDED_RELEASE_TABLET | Freq: Two times a day (BID) | ORAL | Status: DC

## 2020-04-20 MED ORDER — LABETALOL HCL 5 MG/ML IV SOLN: 5.0000 mg | Freq: Three times a day (TID) | INTRAVENOUS | Status: DC | PRN

## 2020-04-20 MED ORDER — SODIUM CHLORIDE (PF) 0.9 % IJ SOLN
INTRAMUSCULAR | Status: AC
  Filled 2020-04-20: qty 20

## 2020-04-20 MED ORDER — OXYCODONE HCL 5 MG PO TABS
5.0000 mg | ORAL_TABLET | ORAL | Status: DC | PRN
  Administered 2020-04-20 – 2020-04-22 (×3): 5 mg via ORAL
  Filled 2020-04-20 (×4): qty 1

## 2020-04-20 MED ORDER — DEXAMETHASONE SODIUM PHOSPHATE 10 MG/ML IJ SOLN
INTRAMUSCULAR | Status: DC | PRN
  Administered 2020-04-20: 8 mg via INTRAVENOUS

## 2020-04-20 MED ORDER — ONDANSETRON HCL 4 MG PO TABS: 4.0000 mg | ORAL_TABLET | Freq: Four times a day (QID) | ORAL | Status: DC | PRN

## 2020-04-20 MED ORDER — PHENYLEPHRINE HCL-NACL 10-0.9 MG/250ML-% IV SOLN
INTRAVENOUS | Status: DC | PRN
  Administered 2020-04-20: 80 ug/min via INTRAVENOUS

## 2020-04-20 MED ORDER — SODIUM CHLORIDE 0.9 % IV SOLN
1.0000 g | Freq: Once | INTRAVENOUS | Status: AC
  Administered 2020-04-20: 1 g via INTRAVENOUS
  Filled 2020-04-20: qty 1

## 2020-04-20 MED ORDER — MORPHINE SULFATE (PF) 4 MG/ML IV SOLN
4.0000 mg | Freq: Once | INTRAVENOUS | Status: AC
  Administered 2020-04-20: 4 mg via INTRAVENOUS
  Filled 2020-04-20: qty 1

## 2020-04-20 MED ORDER — ACETAMINOPHEN 650 MG RE SUPP: 650.0000 mg | Freq: Four times a day (QID) | RECTAL | Status: DC | PRN

## 2020-04-20 MED ORDER — CHLORHEXIDINE GLUCONATE CLOTH 2 % EX PADS
6.0000 | MEDICATED_PAD | Freq: Every day | CUTANEOUS | Status: DC
  Administered 2020-04-20 – 2020-04-23 (×3): 6 via TOPICAL

## 2020-04-20 MED ORDER — SODIUM CHLORIDE 0.9 % IR SOLN
Status: DC | PRN
  Administered 2020-04-20: 1000 mL

## 2020-04-20 MED ORDER — ONDANSETRON HCL 4 MG/2ML IJ SOLN
INTRAMUSCULAR | Status: DC | PRN
  Administered 2020-04-20: 4 mg via INTRAVENOUS

## 2020-04-20 MED ORDER — MIDAZOLAM HCL 2 MG/2ML IJ SOLN
INTRAMUSCULAR | Status: DC | PRN
  Administered 2020-04-20: 2 mg via INTRAVENOUS

## 2020-04-20 MED ORDER — ALBUMIN HUMAN 5 % IV SOLN: 12.5000 g | Freq: Once | INTRAVENOUS | Status: DC

## 2020-04-20 MED ORDER — ONDANSETRON HCL 4 MG/2ML IJ SOLN: 4.0000 mg | Freq: Four times a day (QID) | INTRAMUSCULAR | Status: DC | PRN

## 2020-04-20 MED ORDER — ENOXAPARIN SODIUM 60 MG/0.6ML ~~LOC~~ SOLN
60.0000 mg | SUBCUTANEOUS | Status: DC
  Administered 2020-04-20 – 2020-04-21 (×2): 60 mg via SUBCUTANEOUS
  Filled 2020-04-20 (×3): qty 0.6

## 2020-04-20 MED ORDER — CHLORHEXIDINE GLUCONATE 0.12 % MT SOLN
15.0000 mL | Freq: Once | OROMUCOSAL | Status: AC
  Administered 2020-04-20: 15 mL via OROMUCOSAL

## 2020-04-20 MED ORDER — ALBUMIN HUMAN 5 % IV SOLN
12.5000 g | Freq: Once | INTRAVENOUS | Status: AC
  Administered 2020-04-20: 12.5 g via INTRAVENOUS

## 2020-04-20 MED ORDER — FENTANYL CITRATE (PF) 100 MCG/2ML IJ SOLN
INTRAMUSCULAR | Status: DC | PRN
  Administered 2020-04-20: 100 ug via INTRAVENOUS

## 2020-04-20 MED ORDER — LACTATED RINGERS IV BOLUS
1000.0000 mL | Freq: Once | INTRAVENOUS | Status: AC
  Administered 2020-04-20: 1000 mL via INTRAVENOUS

## 2020-04-20 MED ORDER — PROMETHAZINE HCL 25 MG/ML IJ SOLN
12.5000 mg | Freq: Once | INTRAMUSCULAR | Status: AC
  Administered 2020-04-20: 12.5 mg via INTRAVENOUS
  Filled 2020-04-20: qty 1

## 2020-04-20 MED ORDER — SODIUM CHLORIDE 0.9 % IV SOLN
2.0000 g | INTRAVENOUS | Status: DC
  Administered 2020-04-21 – 2020-04-23 (×3): 2 g via INTRAVENOUS
  Filled 2020-04-20 (×4): qty 20

## 2020-04-20 MED ORDER — POTASSIUM CHLORIDE CRYS ER 20 MEQ PO TBCR
40.0000 meq | EXTENDED_RELEASE_TABLET | Freq: Once | ORAL | Status: AC
  Administered 2020-04-21: 40 meq via ORAL
  Filled 2020-04-20: qty 2

## 2020-04-20 MED ORDER — LACTATED RINGERS IV SOLN: INTRAVENOUS | Status: DC | PRN

## 2020-04-20 MED ORDER — ACETAMINOPHEN 325 MG PO TABS
650.0000 mg | ORAL_TABLET | Freq: Four times a day (QID) | ORAL | Status: DC | PRN
  Administered 2020-04-22 (×2): 650 mg via ORAL
  Filled 2020-04-20 (×2): qty 2

## 2020-04-20 MED ORDER — PHENYLEPHRINE HCL-NACL 10-0.9 MG/250ML-% IV SOLN
25.0000 ug/min | INTRAVENOUS | Status: DC
  Administered 2020-04-21: 75 ug/min via INTRAVENOUS
  Filled 2020-04-20 (×2): qty 250

## 2020-04-20 MED ORDER — ACETAMINOPHEN 10 MG/ML IV SOLN: 1000.0000 mg | Freq: Once | INTRAVENOUS | Status: DC | PRN

## 2020-04-20 MED ORDER — ROCURONIUM BROMIDE 10 MG/ML (PF) SYRINGE
PREFILLED_SYRINGE | INTRAVENOUS | Status: DC | PRN
  Administered 2020-04-20 (×2): 5 mg via INTRAVENOUS

## 2020-04-20 MED ORDER — PHENYLEPHRINE HCL-NACL 10-0.9 MG/250ML-% IV SOLN
INTRAVENOUS | Status: AC
  Filled 2020-04-20: qty 250

## 2020-04-20 MED ORDER — FENTANYL CITRATE (PF) 100 MCG/2ML IJ SOLN
INTRAMUSCULAR | Status: AC
  Filled 2020-04-20: qty 2

## 2020-04-20 MED ORDER — PHENYLEPHRINE 40 MCG/ML (10ML) SYRINGE FOR IV PUSH (FOR BLOOD PRESSURE SUPPORT)
PREFILLED_SYRINGE | INTRAVENOUS | Status: DC | PRN
  Administered 2020-04-20: 200 ug via INTRAVENOUS
  Administered 2020-04-20: 80 ug via INTRAVENOUS
  Administered 2020-04-20: 240 ug via INTRAVENOUS
  Administered 2020-04-20: 160 ug via INTRAVENOUS
  Administered 2020-04-20: 120 ug via INTRAVENOUS

## 2020-04-20 MED ORDER — DEXAMETHASONE SODIUM PHOSPHATE 10 MG/ML IJ SOLN
INTRAMUSCULAR | Status: AC
  Filled 2020-04-20: qty 1

## 2020-04-20 MED ORDER — IOHEXOL 300 MG/ML  SOLN
100.0000 mL | Freq: Once | INTRAMUSCULAR | Status: AC | PRN
  Administered 2020-04-20: 100 mL via INTRAVENOUS

## 2020-04-20 MED ORDER — VASOPRESSIN 20 UNIT/ML IV SOLN
INTRAVENOUS | Status: AC
  Filled 2020-04-20: qty 1

## 2020-04-20 MED ORDER — PHENYLEPHRINE HCL-NACL 10-0.9 MG/250ML-% IV SOLN
INTRAVENOUS | Status: AC
  Administered 2020-04-20: 10 mg via INTRAVENOUS
  Filled 2020-04-20: qty 250

## 2020-04-20 MED ORDER — ACETAMINOPHEN 500 MG PO TABS
1000.0000 mg | ORAL_TABLET | Freq: Once | ORAL | Status: DC
  Filled 2020-04-20: qty 2

## 2020-04-20 MED ORDER — ONDANSETRON 4 MG PO TBDP
4.0000 mg | ORAL_TABLET | Freq: Once | ORAL | Status: AC
  Administered 2020-04-20: 4 mg via ORAL
  Filled 2020-04-20: qty 1

## 2020-04-20 MED ORDER — SUGAMMADEX SODIUM 200 MG/2ML IV SOLN
INTRAVENOUS | Status: DC | PRN
  Administered 2020-04-20: 200 mg via INTRAVENOUS

## 2020-04-20 MED ORDER — NOREPINEPHRINE 4 MG/250ML-% IV SOLN
0.0000 ug/min | INTRAVENOUS | Status: DC
  Administered 2020-04-20: 2 ug/min via INTRAVENOUS
  Filled 2020-04-20 (×2): qty 250

## 2020-04-20 MED ORDER — SODIUM CHLORIDE 0.9 % IV SOLN
1.0000 g | Freq: Once | INTRAVENOUS | Status: AC
  Administered 2020-04-20: 1 g via INTRAVENOUS
  Filled 2020-04-20: qty 10

## 2020-04-20 MED ORDER — POTASSIUM CHLORIDE 2 MEQ/ML IV SOLN: INTRAVENOUS | Status: DC

## 2020-04-20 MED ORDER — ONDANSETRON HCL 4 MG/2ML IJ SOLN
INTRAMUSCULAR | Status: AC
  Filled 2020-04-20: qty 2

## 2020-04-20 MED ORDER — SUCCINYLCHOLINE CHLORIDE 200 MG/10ML IV SOSY
PREFILLED_SYRINGE | INTRAVENOUS | Status: DC | PRN
  Administered 2020-04-20: 200 mg via INTRAVENOUS

## 2020-04-20 SURGICAL SUPPLY — 26 items
BAG DRN RND TRDRP ANRFLXCHMBR (UROLOGICAL SUPPLIES) ×1
BAG URINE DRAIN 2000ML AR STRL (UROLOGICAL SUPPLIES) ×1 IMPLANT
BAG URO CATCHER STRL LF (MISCELLANEOUS) ×2 IMPLANT
BASKET ZERO TIP NITINOL 2.4FR (BASKET) IMPLANT
BSKT STON RTRVL ZERO TP 2.4FR (BASKET)
BULB IRRIG PATHFIND (MISCELLANEOUS) ×2 IMPLANT
CATH FOLEY 2WAY SLVR 30CC 16FR (CATHETERS) ×1 IMPLANT
CATH URET 5FR 28IN OPEN ENDED (CATHETERS) IMPLANT
CLOTH BEACON ORANGE TIMEOUT ST (SAFETY) ×2 IMPLANT
GLOVE SURG ENC TEXT LTX SZ7.5 (GLOVE) ×2 IMPLANT
GOWN STRL REUS W/TWL XL LVL3 (GOWN DISPOSABLE) ×2 IMPLANT
GUIDEWIRE ANG ZIPWIRE 038X150 (WIRE) IMPLANT
GUIDEWIRE STR DUAL SENSOR (WIRE) ×3 IMPLANT
IV NS 1000ML (IV SOLUTION) ×2
IV NS 1000ML BAXH (IV SOLUTION) ×1 IMPLANT
KIT TURNOVER KIT A (KITS) IMPLANT
LASER FIB FLEXIVA PULSE ID 365 (Laser) IMPLANT
MANIFOLD NEPTUNE II (INSTRUMENTS) ×2 IMPLANT
PACK CYSTO (CUSTOM PROCEDURE TRAY) ×2 IMPLANT
SHEATH URETERAL 12FRX35CM (MISCELLANEOUS) IMPLANT
STENT URET 6FRX26 CONTOUR (STENTS) ×1 IMPLANT
SYR 20ML LL LF (SYRINGE) ×2 IMPLANT
TRACTIP FLEXIVA PULS ID 200XHI (Laser) IMPLANT
TRACTIP FLEXIVA PULSE ID 200 (Laser)
TUBING CONNECTING 10 (TUBING) ×2 IMPLANT
TUBING UROLOGY SET (TUBING) ×2 IMPLANT

## 2020-04-20 NOTE — Progress Notes (Signed)
Patient with significant intraprocedural hypotension.  Lactate >4.    Stone now stented.   On phenylephrine, MAP 60-75.  Sleepy post-sedation.  Will order bolus.  Transfer to stepdown.  Will consult PCCM in case further deterioration and needs Levophed.

## 2020-04-20 NOTE — Progress Notes (Signed)
Short stay aware of critical lactic acid of 4.8 on pt.

## 2020-04-20 NOTE — Op Note (Signed)
Preoperative diagnosis:  1. Left ureteral stone   Postoperative diagnosis:  1. Left ureteral stone    Procedure:  1. Cystoscopy 2. left ureteral stent placement (6Fr x 26cm)  Surgeon: Karoline Caldwell, MD  Resident Assistant: Thea Alken, MD  Anesthesia: General  Complications: None  Intraoperative findings: KUB showed persistent contrast in the left kidney with hydronephrosis to the proximal ureter consistent with the patient's known calculus without other abnormalities.  EBL: Minimal  Specimens: left renal pelvis urine culture  Indication: Melissa Cooley is a 40 y.o. patient with sepsis secondary to obstructing 74mm left ureteral stone. After reviewing the management options for treatment, he elected to proceed with the above surgical procedure(s). We have discussed the potential benefits and risks of the procedure, side effects of the proposed treatment, the likelihood of the patient achieving the goals of the procedure, and any potential problems that might occur during the procedure or recuperation. Informed consent has been obtained.  Description of procedure:  The patient was taken to the operating room and general anesthesia was induced.  The patient was placed in the dorsal lithotomy position, prepped and draped in the usual sterile fashion, and preoperative antibiotics were administered. A preoperative time-out was performed.   Cystourethroscopy was performed. There was no evidence for any bladder tumors, stones, or other mucosal pathology.    Attention then turned to the left ureteral orifice and a 0.38 sensor guidewire was then advanced up the left ureter into the renal pelvis under fluoroscopic guidance, followed by a 5 fr open ended catheter. Given persistent contrast in the renal pelvis from prior CT scan that was visible on fluoroscopy, no retrograde pyelogram was performed.   There was cloudy hydronephrotic drip from the kidney on removal of the wire. A urine culture  was obtained. The wire was then replaced and the open ended catheter removed. A 6Fr x 26cm stent was positioned appropriately under fluoroscopic and cystoscopic guidance.  The wire was then removed with an adequate stent curl noted in the renal pelvis as well as in the bladder. Tether was removed.   The bladder was then emptied and the procedure ended.  The patient appeared to tolerate the procedure well and without complications.  The patient was able to be awakened and transferred to the recovery unit in satisfactory condition.

## 2020-04-20 NOTE — Progress Notes (Signed)
CRITICAL VALUE ALERT  Critical Value  Lactic acid  4.8  Date & Time Notied:  04/20/20 1520  Provider Notified: Dr Benancio Deeds  Orders Received/Actions taken: surgery for stent. Rocephin 1 gram in addition to rocephin 2 grams q24 hours

## 2020-04-20 NOTE — Anesthesia Procedure Notes (Signed)
Procedure Name: Intubation Date/Time: 04/20/2020 4:19 PM Performed by: Raenette Rover, CRNA Pre-anesthesia Checklist: Patient identified, Emergency Drugs available, Suction available and Patient being monitored Patient Re-evaluated:Patient Re-evaluated prior to induction Oxygen Delivery Method: Circle system utilized Preoxygenation: Pre-oxygenation with 100% oxygen Induction Type: IV induction, Rapid sequence and Cricoid Pressure applied Laryngoscope Size: Mac and 3 Grade View: Grade I Tube type: Oral Tube size: 7.5 mm Number of attempts: 1 Airway Equipment and Method: Stylet Placement Confirmation: ETT inserted through vocal cords under direct vision,  positive ETCO2 and breath sounds checked- equal and bilateral Secured at: 21 cm Tube secured with: Tape Dental Injury: Teeth and Oropharynx as per pre-operative assessment

## 2020-04-20 NOTE — Transfer of Care (Signed)
Immediate Anesthesia Transfer of Care Note  Patient: Kirti Bertran  Procedure(s) Performed: CYSTOSCOPY WITH Left JJ STENT PLACEMENT (Left )  Patient Location: PACU  Anesthesia Type:General  Level of Consciousness: awake, alert , oriented and patient cooperative  Airway & Oxygen Therapy: Patient Spontanous Breathing and Patient connected to face mask oxygen  Post-op Assessment: Report given to RN and Post -op Vital signs reviewed and stable  Post vital signs: Reviewed and stable--MDA made aware that patient is on Phenylephrine gtt in PACU; care resumed by PACU RN  Last Vitals:  Vitals Value Taken Time  BP 91/56 04/20/20 1710  Temp 36.8 C 04/20/20 1705  Pulse 114 04/20/20 1710  Resp 28 04/20/20 1715  SpO2 87 % 04/20/20 1710  Vitals shown include unvalidated device data.  Last Pain:  Vitals:   04/20/20 1705  TempSrc:   PainSc: 0-No pain      Patients Stated Pain Goal: 2 (04/20/20 1331)  Complications: No complications documented.

## 2020-04-20 NOTE — Sepsis Progress Note (Signed)
eLink is monitoring this Code Sepsis. °

## 2020-04-20 NOTE — ED Triage Notes (Signed)
Patient reports left abdominal pain with emesis and diarrhea onset yesterday , no fever or chills , patient stated negative Covid result yesterday .

## 2020-04-20 NOTE — H&P (Addendum)
History and Physical  Patient Name: Melissa Cooley     IOE:[id number]    DOB: 07/07/1980    DOA: 04/20/2020 PCP: Hoy Register, MD  Patient coming from: Home  Chief Complaint: Flank pain, vomiting, malaise      HPI: Melissa Cooley is a 40 y.o. F with hx HTN who presents with 2 days malaise, left flank pain, vomiting, and dysuria.  Patient was in her usual state of health until yesterday morning, she developed severe left flank pain, vomiting, malaise.  Throughout the day she had dysuria, foul-smelling urine, and felt progressively worse until this morning she came to the ER.  In the ER she had heart rate 122, WBC 16.9K.  Covid negative, urinalysis with frank pyuria, bacteria.  Creatinine 1.45 from baseline one.  Potassium 3.1.  CT of the abdomen and pelvis showed a 5 mm left-sided kidney stone with hydronephrosis and pyelonephritis.  She started on IV fluids and antibiotics and the urology service were called.          ROS: Review of Systems  Constitutional: Positive for chills and malaise/fatigue. Negative for fever.  Gastrointestinal: Positive for abdominal pain, nausea and vomiting.  Genitourinary: Positive for dysuria and flank pain. Negative for hematuria.  All other systems reviewed and are negative.         Past Medical History:  Diagnosis Date  . Anxiety   . Depression   . Gout   . Hypertension     Past Surgical History:  Procedure Laterality Date  . CESAREAN SECTION    . CESAREAN SECTION    . CHOLECYSTECTOMY    . TUBAL LIGATION      Social History: Patient lives with her daughter.  The patient walks unassisted.  Former smoker.  No Known Allergies  Family history: Lupus in an aunt.  HTN in multiple family members. Other family history includes Anxiety disorder in her sister; Bipolar disorder in her mother; Breast cancer (age of onset: 104) in her mother; Cancer in her mother; Diabetes in her father; Drug abuse in her brother and mother; Hypertension in an  other family member; Lupus in her maternal aunt.  Prior to Admission medications   Medication Sig Start Date End Date Taking? Authorizing Provider  acetaminophen (TYLENOL) 500 MG tablet Take 1 tablet (500 mg total) by mouth every 6 (six) hours as needed. Patient taking differently: Take 500 mg by mouth every 6 (six) hours as needed for mild pain, fever or headache. 10/29/19  Yes Darr, Gerilyn Pilgrim, PA-C  promethazine (PHENERGAN) 25 MG/ML injection Inject 0.5 mLs (12.5 mg total) into the vein once for 1 dose. 04/20/20 04/20/20 Yes Wynetta Fines, MD  VITAMIN D PO Take 1 capsule by mouth daily.   Yes [provider]  amoxicillin (AMOXIL) 875 MG tablet Take 1 tablet (875 mg total) by mouth 2 (two) times daily. Patient not taking: Reported on 04/20/2020 03/14/20   Wallis Bamberg, PA-C  benzonatate (TESSALON) 100 MG capsule Take 1-2 capsules (100-200 mg total) by mouth 3 (three) times daily as needed. Patient not taking: Reported on 04/20/2020 03/14/20   Wallis Bamberg, PA-C  cetirizine (ZYRTEC ALLERGY) 10 MG tablet Take 1 tablet (10 mg total) by mouth daily. Patient not taking: Reported on 04/20/2020 03/14/20   Wallis Bamberg, PA-C  hydrochlorothiazide (MICROZIDE) 12.5 MG capsule Take 1 capsule (12.5 mg total) by mouth daily. Patient not taking: Reported on 04/20/2020 09/05/19   Hoy Register, MD  hydrOXYzine (ATARAX/VISTARIL) 10 MG tablet TAKE 1-2 TABLETS BY MOUTH ONCE  DAILY AS NEEDED FOR ANZIETY Patient not taking: Reported on 04/20/2020 02/06/20   Hoy Register, MD  ibuprofen (ADVIL) 600 MG tablet Take 1 tablet (600 mg total) by mouth every 6 (six) hours as needed. 04/19/20   Mickie Bail, NP  irbesartan (AVAPRO) 150 MG tablet Take 1 tablet (150 mg total) by mouth daily. Patient not taking: Reported on 04/20/2020 09/05/19   Hoy Register, MD  ondansetron (ZOFRAN) 4 MG tablet Take 1 tablet (4 mg total) by mouth every 6 (six) hours as needed for nausea or vomiting. 04/19/20   Mickie Bail, NP  predniSONE (DELTASONE)  20 MG tablet Take 2 tablets daily with breakfast. Patient not taking: Reported on 04/20/2020 03/14/20   Wallis Bamberg, PA-C  promethazine-dextromethorphan (PROMETHAZINE-DM) 6.25-15 MG/5ML syrup Take 5 mLs by mouth at bedtime as needed for cough. Patient not taking: Reported on 04/20/2020 03/14/20   Wallis Bamberg, PA-C       Physical Exam: BP (!) 96/48 (BP Location: Left Arm)   Pulse (!) 118   Temp 99 F (37.2 C) (Oral)   Resp (!) 25   Ht 5\' 11"  (1.803 m)   Wt 120.2 kg   LMP 04/06/2020 (Approximate)   SpO2 100%   BMI 36.96 kg/m  General appearance: Well-developed, adult female, alert and in no acute distress.   Eyes: Anicteric, conjunctiva pink, lids and lashes normal. PERRL.    ENT: No nasal deformity, discharge, epistaxis.  Hearing normal. OP tacky dry without lesions.  Dentition in good repair.  Lips normal.  Skin: Warm and dry.  No jaundice.  No suspicious rashes or lesions. Cardiac: Tachycardic,, nl S1-S2, no murmurs appreciated.  Capillary refill is brisk.  JVP not visible.  No LE edema.  Radial pulses 2+ and symmetric. Respiratory: Normal respiratory rate and rhythm.  CTAB without rales or wheezes. Abdomen: Abdomen soft.  Moderate Left TTP without gaurding rigidty or rebound.  No CVA tenderness that I can tell. No ascites, distension, hepatosplenomegaly.   MSK: No deformities or effusions of the large joints of the upper or lower extremities bilaterally.  No cyanosis or clubbing. Neuro:   Sensation intact to light touch. Speech is fluent.  Muscle strength symmetric.    Psych: Sensorium intact and responding to questions, attention normal.  Behavior appropriate.  Affect normal.  Judgment and insight appear normal.     Labs on Admission:  I have personally reviewed following labs and imaging studies: CBC: Recent Labs  Lab 04/20/20 0659  WBC 16.9*  HGB 13.9  HCT 40.0  MCV 90.5  PLT 301   Basic Metabolic Panel: Recent Labs  Lab 04/20/20 0659  NA 134*  K 3.1*  CL 104   CO2 17*  GLUCOSE 137*  BUN 15  CREATININE 1.45*  CALCIUM 9.0   GFR: Estimated Creatinine Clearance: 74.5 mL/min (A) (by C-G formula based on SCr of 1.45 mg/dL (H)).  Liver Function Tests: Recent Labs  Lab 04/20/20 0659  AST 32  ALT 27  ALKPHOS 70  BILITOT 1.1  PROT 7.5  ALBUMIN 3.7   Recent Labs  Lab 04/20/20 0659  LIPASE 28   No results for input(s): AMMONIA in the last 168 hours. Coagulation Profile: No results for input(s): INR, PROTIME in the last 168 hours. Cardiac Enzymes: No results for input(s): CKTOTAL, CKMB, CKMBINDEX, TROPONINI in the last 168 hours. BNP (last 3 results) No results for input(s): PROBNP in the last 8760 hours. HbA1C: No results for input(s): HGBA1C in the last 72 hours. CBG:  No results for input(s): GLUCAP in the last 168 hours. Lipid Profile: No results for input(s): CHOL, HDL, LDLCALC, TRIG, CHOLHDL, LDLDIRECT in the last 72 hours. Thyroid Function Tests: No results for input(s): TSH, T4TOTAL, FREET4, T3FREE, THYROIDAB in the last 72 hours. Anemia Panel: No results for input(s): VITAMINB12, FOLATE, FERRITIN, TIBC, IRON, RETICCTPCT in the last 72 hours. Sepsis Labs:   Recent Results (from the past 240 hour(s))  SARS CORONAVIRUS 2 (TAT 6-24 HRS) Nasopharyngeal Nasopharyngeal Swab     Status: None   Collection Time: 04/19/20  9:12 PM   Specimen: Nasopharyngeal Swab  Result Value Ref Range Status   SARS Coronavirus 2 NEGATIVE NEGATIVE Final    Comment: (NOTE) SARS-CoV-2 target nucleic acids are NOT DETECTED.  The SARS-CoV-2 RNA is generally detectable in upper and lower respiratory specimens during the acute phase of infection. Negative results do not preclude SARS-CoV-2 infection, do not rule out co-infections with other pathogens, and should not be used as the sole basis for treatment or other patient management decisions. Negative results must be combined with clinical observations, patient history, and epidemiological  information. The expected result is Negative.  Fact Sheet for Patients: HairSlick.no  Fact Sheet for Healthcare Providers: quierodirigir.com  This test is not yet approved or cleared by the [place] FDA and  has been authorized for detection and/or diagnosis of SARS-CoV-2 by FDA under an Emergency Use Authorization (EUA). This EUA will remain  in effect (meaning this test can be used) for the duration of the COVID-19 declaration under Se ction 564(b)(1) of the Act, 21 U.S.C. section 360bbb-3(b)(1), unless the authorization is terminated or revoked sooner.  Performed at [facility], [address], [place], [place] [address]            Radiological Exams on Admission: Personally reviewed CT report shows impacted stone and pyelo: CT ABDOMEN PELVIS W CONTRAST  Result Date: 04/20/2020 CLINICAL DATA:  Left flank pain. Kidney stone suspected. Suspected pyelonephritis. Vomiting and fever. EXAM: CT ABDOMEN AND PELVIS WITH CONTRAST TECHNIQUE: Multidetector CT imaging of the abdomen and pelvis was performed using the standard protocol following bolus administration of intravenous contrast. CONTRAST:  OMNIPAQUE IOHEXOL 300 MG/ML  SOLN COMPARISON:  None. FINDINGS: Lower chest: Mild atelectasis in the left base. No other acute abnormalities in the lower chest. Hepatobiliary: No focal liver abnormality is seen. Status post cholecystectomy. No biliary dilatation. Pancreas: Unremarkable. No pancreatic ductal dilatation or surrounding inflammatory changes. Spleen: Normal in size without focal abnormality. Adrenals/Urinary Tract: There is a 1 or 2 mm stone in the right kidney seen on coronal image 94. A probable punctate stone is seen in the right kidney on coronal image 81. No masses, hydronephrosis, or perinephric stranding on the right. The right ureter is normal. The left kidney is enlarged compared to the right with less  enhancement of the left renal cortex compared to the right. No striated nephrogram identified. There is a 2 mm stone in the left kidney seen on coronal image 70. No other stones are seen in the left kidney. There is moderate hydronephrosis and perinephric stranding on the left. The proximal left ureter is dilated. There is a 5 mm stone in the proximal left ureter seen on coronal image 75. The remainder of the left ureter is unremarkable and on dilated with no additional stones identified. The bladder is normal. Stomach/Bowel: Stomach is within normal limits. Appendix appears normal. No evidence of bowel wall thickening, distention, or inflammatory changes. Vascular/Lymphatic: No significant  vascular findings are present. No enlarged abdominal or pelvic lymph nodes. Reproductive: Uterus and bilateral adnexa are unremarkable. Other: No abdominal wall hernia or abnormality. No abdominopelvic ascites. Musculoskeletal: No acute or significant osseous findings. IMPRESSION: 1. There is a 5 mm stone in the proximal left ureter resulting in ureterectasis, moderate hydronephrosis, and moderate perinephric stranding. There is also decreased enhancement in the left renal cortex versus the right suggesting significant obstruction. No abscess. No striated nephrogram to confirm the clinical suspicion of pyelonephritis. 2. Small stones in both kidneys as above. Electronically Signed   By: Gerome Sam III M.D   On: 04/20/2020 09:42    EKG: Independently reviewed. Rate 130s, QTc normal, no ST achanges, sinus tachycardia,.       Assessment/Plan   Septic shock due to UTI Impacted nephrolithiasis with hydronephrosis Sepsis on presentation with heart rate 122, elevated WBC.  Source urine.  Lactic acid >4.  -Continue ceftriaxone -Continue IV fluids -Consult urology for stone management, appreciate expertise  -Obtain lactate -Follow-up urine and blood cultures   Hypertension -Hold irbesartan dna  HCTZ -Labetalol PRN for severe range pressures  Hypokalemia -Check magnesium -Supplement potassium  AKI Mild, baseline creatinine 1.0 mg/dL, currently up to 3.22 due to impacted renal stone and sepsis and dehydration. -Hold irbesartan, HCTZ -IV fluids -Close follow-up BMP tomorrow  Obesity BMI 36    DVT prophylaxis: Lovenox  Code Status: FULL  Family Communication: Daughter at bedside  Disposition Plan: Anticipate IV fluids, antibiotics, stone management by urology urgently toda.     Consults called: Urology, Dr. Benancio Deeds Admission status: INPATIENT    At the time of admission, it appears that the appropriate admission status for this patient is INPATIENT. This is judged to be reasonable and necessary in order to provide the required intensity of service to ensure the patient's safety given: -presenting symptoms of vomiting, flank pain -physical exam findings of tachycardia, abdominal pain, and  -initial radiographic and laboratory data acute kidney injury, leukocytosis, pyuria, impacted renal stone on imaging -in the context of their chronic comorbidities obesity, hypertension    Together, these circumstances are felt to place her at high risk for further clinical deterioration threatening life, limb, or organ requiring a high intensity of service due to this acute illness that poses a threat to life, limb or bodily function.  I certify that at the point of admission it is my clinical judgment that the patient will require inpatient hospital care spanning beyond 2 midnights from the point of admission and that early discharge would result in unnecessary risk of decompensation and readmission or threat to life, limb or bodily function.   Medical decision making: Patient seen at 9:09 AM on 04/20/2020.  The patient was discussed with Dr. Rodena Medin.  What exists of the patient's chart was reviewed in depth and summarized above.  Clinical condition: BP trending down but mentation  good, asymptomatic.        Earl Lites Zaivion Kundrat [facility] Please page though AMION or Epic secure chat:  For password, contact charge nurse

## 2020-04-20 NOTE — Consult Note (Signed)
NAMEGeorgiann Cooley, MRN:  [id number], DOB:  01-22-1981, LOS: 0 ADMISSION DATE:  04/20/2020, CONSULTATION DATE: 04/20/20 REFERRING MD:  Joen Laura CHIEF COMPLAINT:  Septic shock, need for pressor initation   Brief History:  40 year old female who initially presented to [facility] ED 04/19/20 with acute onset of L flank pain, vomiting and fever; found to have 67mm L proximal obstructing ureteral stone with associated UTI. Underwent cysto L JJ ureteral stent insertion 2/4. Post-procedure, patient became hypotensive to SBP 80s (MAP 75) in PACU despite fluid boluses and phenylephrine. PCCM was consulted for possible septic shock 2/2 urosepsis and possible pressor initiation.  History of Present Illness:  Melissa Cooley is a 40 year old female with PMH significant for HTN, gout and anxiety/depression who initially presented to [facility] ED 2/3 for fever, vomiting and acute onset of new L flank pain. She left the ED 2/3PM but returned 2/4AM with worsening symptoms. On arrival, patient's labs were notable for WBC 16.9, Cr 1.4 (baseline 0.7-0.9), lactate 4.8. UA demonstrated positive nitrites, trace leuks and bacteria. UCx and BCx were sent. CT A/P was completed 2/4 demonstrating an obstructing 47mm stone in the proximal L ureter. She subsequently underwent cysto placement of a L JJ ureteral stent for relief of the obstruction. Post-procedure in recovery, patient became hypotensive to SBP 80s and fluid boluses/phenylephrine were initiated. PCCM was consulted for septic shock 2/2 likely urosepsis and possible initiation of levophed.  Patient seen and examined in PACU. History as documented above. Currently on 50 mics of phenylephrine. Initial mean arterial pressure 58. Orders have been given for transition to Levophed she has 2 good peripheral IVs I think we can continue vasopressors peripherally. If the increase in she does not respond to additional fluid resuscitation may need to consider central venous catheter placement  would like to avoid this if at all possible. Patient would also like to avoid this after discussing with her at bedside. Otherwise her abdominal pain is better than where she was before.  Past Medical History:   Past Medical History:  Diagnosis Date  . Anxiety   . Depression   . Gout   . Hypertension    Significant Hospital Events:  2/3 Presented to [facility] ED for L flank pain/vomiting/fever, obstructing ureteral stone identified 2/4 Cysto insertion of L JJ ureteral stent  Consults:    Procedures:  2/4 L JJ Ureteral Stent Placement  Significant Diagnostic Tests:   2/4 CT A/P >> 52mm stone in proximal L ureter resulting in ureterectasis, moderate hydronephrosis, moderate perinephric stranding; decreased enhancement in the L renal cortex suggesting significant obstruction; no abscess  Micro Data:  2/3 COVID >> negative 2/4 UCx >> 2/4 BC x 2 >>  Antimicrobials:  Ceftriaxone 2/4 >>  Interim History / Subjective:  Abdominal pain improved. Remains hypotensive in PACU. Additional liter fluid bolus infusing.  Objective   Blood pressure (!) 84/57, pulse (!) 115, temperature 98.3 F (36.8 C), resp. rate (!) 26, height 5\' 11"  (1.803 m), weight 120.2 kg, last menstrual period 04/06/2020, SpO2 92 %.        Intake/Output Summary (Last 24 hours) at 04/20/2020 1732 Last data filed at 04/20/2020 1700 Gross per 24 hour  Intake 1300 ml  Output --  Net 1300 ml   Filed Weights   04/20/20 0830  Weight: 120.2 kg    Examination: General: Young female resting in bed HENT: NCAT, tracking appropriately Lungs: Clear to auscultation bilateral no crackles no wheeze Cardiovascular: Regular, tachycardic, S1-S2 Abdomen: Soft  obese, nondistended no significant pain with palpation Extremities: No significant edema Neuro: Alert following commands no focal deficit moves all 4 extremities GU: Foley in place  Resolved Hospital Problem list     Assessment & Plan:   Septic shock Urinary tract  infection, left obstructed ureteral stone status post, double-J stent placement and Foley by urology. Lactic acidosis secondary to above Leukocytosis secondary to above Plan: Wean vasopressors to maintain mean arterial pressure greater than 65 mmHg Currently on phenylephrine, plan to switch to norepinephrine, peripheral infusion I suspect she will only require low-dose. Additional IV fluid liter bolus infusing. Repeat lactate in 4 hours Continue ceftriaxone I suspect hypotensive state is related to stone manipulation and bacterial translocation.  Hyponatremia Plan: Observe, likely related to sepsis state, pain, SIADH  Hypokalemia Plan:  Replete if she has not already gotten some. Recheck BMP now  Hypomagnesemia Plan: 2 g IV mag  Pain control, nausea Plan: Postop orders per urology As needed Zofran, morphine.    Best practice (evaluated daily)  Diet: Advance per urology Pain/Anxiety/Delirium protocol (if indicated): Postop orders per urology VAP protocol (if indicated): Not applicable DVT prophylaxis: Lovenox GI prophylaxis: Not applicable Glucose control: SSI Mobility: Bedrest Disposition: Stepdown unit  Goals of Care:  Last date of multidisciplinary goals of care discussion:N/a Family and staff present: N/a Summary of discussion: N/a Follow up goals of care discussion due: N/a Code Status: Full   Labs   CBC: Recent Labs  Lab 04/20/20 0659  WBC 16.9*  HGB 13.9  HCT 40.0  MCV 90.5  PLT 301    Basic Metabolic Panel: Recent Labs  Lab 04/20/20 0659 04/20/20 1419  NA 134*  --   K 3.1*  --   CL 104  --   CO2 17*  --   GLUCOSE 137*  --   BUN 15  --   CREATININE 1.45*  --   CALCIUM 9.0  --   MG  --  1.3*   GFR: Estimated Creatinine Clearance: 74.5 mL/min (A) (by C-G formula based on SCr of 1.45 mg/dL (H)). Recent Labs  Lab 04/20/20 0659 04/20/20 1412  WBC 16.9*  --   LATICACIDVEN  --  4.8*    Liver Function Tests: Recent Labs  Lab  04/20/20 0659  AST 32  ALT 27  ALKPHOS 70  BILITOT 1.1  PROT 7.5  ALBUMIN 3.7   Recent Labs  Lab 04/20/20 0659  LIPASE 28   No results for input(s): AMMONIA in the last 168 hours.  ABG No results found for: PHART, PCO2ART, PO2ART, HCO3, TCO2, ACIDBASEDEF, O2SAT   Coagulation Profile: No results for input(s): INR, PROTIME in the last 168 hours.  Cardiac Enzymes: No results for input(s): CKTOTAL, CKMB, CKMBINDEX, TROPONINI in the last 168 hours.  HbA1C: Hgb A1c MFr Bld  Date/Time Value Ref Range Status  02/20/2017 02:43 PM 5.5 4.8 - 5.6 % Final    Comment:             Prediabetes: 5.7 - 6.4          Diabetes: >6.4          Glycemic control for adults with diabetes: <7.0     CBG: No results for input(s): GLUCAP in the last 168 hours.  Review of Systems:   Review of Systems  Constitutional: Negative for chills, fever, malaise/fatigue and weight loss.  HENT: Negative for hearing loss, sore throat and tinnitus.   Eyes: Negative for blurred vision and double vision.  Respiratory: Negative  for cough, hemoptysis, sputum production, shortness of breath, wheezing and stridor.   Cardiovascular: Negative for chest pain, palpitations, orthopnea, leg swelling and PND.  Gastrointestinal: Positive for abdominal pain. Negative for constipation, diarrhea, heartburn, nausea and vomiting.  Genitourinary: Negative for dysuria, hematuria and urgency.  Musculoskeletal: Negative for joint pain and myalgias.  Skin: Negative for itching and rash.  Neurological: Negative for dizziness, tingling, weakness and headaches.  Endo/Heme/Allergies: Negative for environmental allergies. Does not bruise/bleed easily.  Psychiatric/Behavioral: Negative for depression. The patient is not nervous/anxious and does not have insomnia.   All other systems reviewed and are negative.    Past Medical History:  She,  has a past medical history of Anxiety, Depression, Gout, and Hypertension.   Surgical  History:   Past Surgical History:  Procedure Laterality Date  . CESAREAN SECTION    . CESAREAN SECTION    . CHOLECYSTECTOMY    . TUBAL LIGATION       Social History:   reports that she has quit smoking. She has never used smokeless tobacco. She reports that she does not drink alcohol and does not use drugs.   Family History:  Her family history includes Anxiety disorder in her sister; Bipolar disorder in her mother; Breast cancer (age of onset: 81) in her mother; Cancer in her mother; Diabetes in her father; Drug abuse in her brother and mother; Hypertension in an other family member; Lupus in her maternal aunt.   Allergies No Known Allergies   Home Medications  Prior to Admission medications   Medication Sig Start Date End Date Taking? Authorizing Provider  acetaminophen (TYLENOL) 500 MG tablet Take 1 tablet (500 mg total) by mouth every 6 (six) hours as needed. Patient taking differently: Take 500 mg by mouth every 6 (six) hours as needed for mild pain, fever or headache. 10/29/19  Yes Darr, Gerilyn Pilgrim, PA-C  promethazine (PHENERGAN) 25 MG/ML injection Inject 0.5 mLs (12.5 mg total) into the vein once for 1 dose. 04/20/20 04/20/20 Yes Wynetta Fines, MD  VITAMIN D PO Take 1 capsule by mouth daily.   Yes [provider]  amoxicillin (AMOXIL) 875 MG tablet Take 1 tablet (875 mg total) by mouth 2 (two) times daily. Patient not taking: Reported on 04/20/2020 03/14/20   Wallis Bamberg, PA-C  benzonatate (TESSALON) 100 MG capsule Take 1-2 capsules (100-200 mg total) by mouth 3 (three) times daily as needed. Patient not taking: Reported on 04/20/2020 03/14/20   Wallis Bamberg, PA-C  cetirizine (ZYRTEC ALLERGY) 10 MG tablet Take 1 tablet (10 mg total) by mouth daily. Patient not taking: Reported on 04/20/2020 03/14/20   Wallis Bamberg, PA-C  hydrochlorothiazide (MICROZIDE) 12.5 MG capsule Take 1 capsule (12.5 mg total) by mouth daily. Patient not taking: Reported on 04/20/2020 09/05/19   Hoy Register,  MD  hydrOXYzine (ATARAX/VISTARIL) 10 MG tablet TAKE 1-2 TABLETS BY MOUTH ONCE DAILY AS NEEDED FOR ANZIETY Patient not taking: Reported on 04/20/2020 02/06/20   Hoy Register, MD  ibuprofen (ADVIL) 600 MG tablet Take 1 tablet (600 mg total) by mouth every 6 (six) hours as needed. 04/19/20   Mickie Bail, NP  irbesartan (AVAPRO) 150 MG tablet Take 1 tablet (150 mg total) by mouth daily. Patient not taking: Reported on 04/20/2020 09/05/19   Hoy Register, MD  ondansetron (ZOFRAN) 4 MG tablet Take 1 tablet (4 mg total) by mouth every 6 (six) hours as needed for nausea or vomiting. 04/19/20   Mickie Bail, NP  predniSONE (DELTASONE) 20 MG tablet  Take 2 tablets daily with breakfast. Patient not taking: Reported on 04/20/2020 03/14/20   Wallis Bamberg, PA-C  promethazine-dextromethorphan (PROMETHAZINE-DM) 6.25-15 MG/5ML syrup Take 5 mLs by mouth at bedtime as needed for cough. Patient not taking: Reported on 04/20/2020 03/14/20   Wallis Bamberg, PA-C     This patient is critically ill with multiple organ system failure; which, requires frequent high complexity decision making, assessment, support, evaluation, and titration of therapies. This was completed through the application of advanced monitoring technologies and extensive interpretation of multiple databases. During this encounter critical care time was devoted to patient care services described in this note for 32 minutes.  Josephine Igo, DO [facility] 04/20/2020 5:33 PM

## 2020-04-20 NOTE — Progress Notes (Signed)
eLink Physician-Brief Progress Note Patient Name: Melissa Cooley DOB: 05-12-1980 MRN: [id number]   Date of Service  04/20/2020  HPI/Events of Note  Patient with transient desaturation following volume resuscitation for septic shock, BP now improved to 100/59, MAP 73. Saturation currently 100 % on a non re-breather mask. Patient has a BMI of 37.  eICU Interventions  Low level BIPAP ordered to assist patient with recruitment, and IV fluids reduced to 75  Ml / hour after completion of 1000 ml LR bolus.        Melissa Cooley 04/20/2020, 9:40 PM

## 2020-04-20 NOTE — Progress Notes (Signed)
CRITICAL VALUE ALERT  Critical Value:  Lactic Acid 4.8  Date & Time Notied:  04/20/20, 1510  Provider Notified: MD Danford  Orders Received/Actions taken: Pt taken down for surgery, no new orders at this time.

## 2020-04-20 NOTE — ED Notes (Signed)
Patient transported to CT 

## 2020-04-20 NOTE — Progress Notes (Signed)
eLink Physician-Brief Progress Note Patient Name: Melissa Cooley DOB: 1980/10/25 MRN: [id number]   Date of Service  04/20/2020  HPI/Events of Note  Patient admitted with obstructive uropathy secondary to a renal stone, septic shock from sepsis of urinary tract origin, and acute kidney injury. She remains hypotensive.  eICU Interventions  New Patient Evaluation completed. LR 1000 ml iv fluid bolus ordered stat, and Phenylephrine infusion increased to 100 mcg.        Migdalia Dk 04/20/2020, 8:47 PM

## 2020-04-20 NOTE — Progress Notes (Signed)
   04/20/20 1333  Assess: MEWS Score  Temp 99 F (37.2 C)  BP (!) 96/48  Pulse Rate (!) 118  Resp (!) 25  SpO2 100 %  O2 Device Room Air  Assess: MEWS Score  MEWS Temp 0  MEWS Systolic 1  MEWS Pulse 2  MEWS RR 1  MEWS LOC 0  MEWS Score 4  MEWS Score Color Red  Assess: if the MEWS score is Yellow or Red  Were vital signs taken at a resting state? Yes  Focused Assessment No change from prior assessment  Early Detection of Sepsis Score *See Row Information* Medium  MEWS guidelines implemented *See Row Information* Yes  Treat  MEWS Interventions Escalated (See documentation below)  Take Vital Signs  Increase Vital Sign Frequency  Red: Q 1hr X 4 then Q 4hr X 4, if remains red, continue Q 4hrs  Escalate  MEWS: Escalate Red: discuss with charge nurse/RN and provider, consider discussing with RRT  Notify: Charge Nurse/RN  Name of Charge Nurse/RN Notified Tommy Medal  Date Charge Nurse/RN Notified 04/20/20  Time Charge Nurse/RN Notified 1355  Notify: Provider  Provider Name/Title MD Danford  Date Provider Notified 04/20/20  Time Provider Notified 1359  Notification Type Page  Notification Reason Other (Comment) (VS abnormal)  Response See new orders  Date of Provider Response 04/20/20  Time of Provider Response 1406

## 2020-04-20 NOTE — Anesthesia Postprocedure Evaluation (Addendum)
Anesthesia Post Note  Patient: Trenell Norfolk  Procedure(s) Performed: CYSTOSCOPY WITH Left JJ STENT PLACEMENT (Left )     Patient location during evaluation: PACU Anesthesia Type: General Level of consciousness: awake and alert Pain management: pain level controlled Vital Signs Assessment: post-procedure vital signs reviewed and stable Respiratory status: spontaneous breathing, nonlabored ventilation, respiratory function stable and patient connected to nasal cannula oxygen Cardiovascular status: blood pressure returned to baseline and stable Postop Assessment: no apparent nausea or vomiting Anesthetic complications: no Comments: Good mentation, requiring vasopressor support to maintain MAP > 60. Will administer 500 mL of Albumin. Ok to go to step down after albumin administration w/ good mentation and good UOP.    No complications documented.  Last Vitals:  Vitals:   04/20/20 1840 04/20/20 1845  BP: 98/62   Pulse: (!) 111 (!) 110  Resp: 17 19  Temp:    SpO2: 94%     Last Pain:  Vitals:   04/20/20 1845  TempSrc:   PainSc: 0-No pain                 Shelton Silvas

## 2020-04-20 NOTE — Interval H&P Note (Signed)
History and Physical Interval Note:  04/20/2020 3:22 PM  Melissa Cooley  has presented today for surgery, with the diagnosis of left ureteral blockage.  The various methods of treatment have been discussed with the patient and family. After consideration of risks, benefits and other options for treatment, the patient has consented to  Procedure(s): CYSTOSCOPY WITH RETROGRADE PYELOGRAM, URETEROSCOPY AND STENT PLACEMENT (Left) as a surgical intervention.  The patient's history has been reviewed, patient examined, no change in status, stable for surgery.  I have reviewed the patient's chart and labs.  Questions were answered to the patient's satisfaction.     Belva Agee

## 2020-04-20 NOTE — ED Provider Notes (Signed)
[facility] EMERGENCY DEPARTMENT Provider Note   CSN: [id number] Arrival date & time: 04/20/20  8413     History Chief Complaint  Patient presents with  . Abdominal Pain    Melissa Cooley is a 40 y.o. female.  40 year old female with prior medical history as detailed below presents for evaluation.  Patient reports onset of fever, chills, nausea, vomiting, dysuria, and left flank pain yesterday.  Patient reports persistent vomiting since onset of symptoms.  She is not able to take p.o.  Patient denies recent UTI.  She denies prior history of pyelonephritis or renal stone.  Patient reports being seen at urgent care yesterday.  She reports a Covid PCR test was negative this morning.  Patient reports that her urine looks "gross."  The history is provided by the patient, medical records and the EMS personnel.  Flank Pain This is a new problem. The current episode started yesterday. The problem occurs constantly. The problem has not changed since onset.Pertinent negatives include no chest pain, no abdominal pain and no shortness of breath. Nothing aggravates the symptoms. Nothing relieves the symptoms.       Past Medical History:  Diagnosis Date  . Anxiety   . Depression   . Gout   . Hypertension     Patient Active Problem List   Diagnosis Date Noted  . Family history of breast cancer in mother 05/11/2017  . Obese 04/03/2017  . Hypertension 02/20/2017  . Irritable bowel syndrome 02/20/2017  . GERD (gastroesophageal reflux disease) 02/20/2017    Past Surgical History:  Procedure Laterality Date  . CESAREAN SECTION    . CESAREAN SECTION    . CHOLECYSTECTOMY    . TUBAL LIGATION       OB History    Gravida  5   Para  2   Term      Preterm      AB  3   Living  2     SAB      IAB  3   Ectopic      Multiple      Live Births  2           Family History  Problem Relation Age of Onset  . Cancer Mother   . Drug abuse Mother   .  Bipolar disorder Mother   . Breast cancer Mother 56  . Diabetes Father   . Hypertension Other   . Anxiety disorder Sister   . Drug abuse Brother     Social History   Tobacco Use  . Smoking status: Former Games developer  . Smokeless tobacco: Never Used  Substance Use Topics  . Alcohol use: No  . Drug use: No    Home Medications Prior to Admission medications   Medication Sig Start Date End Date Taking? Authorizing Provider  acetaminophen (TYLENOL) 500 MG tablet Take 1 tablet (500 mg total) by mouth every 6 (six) hours as needed. 10/29/19   Darr, Gerilyn Pilgrim, PA-C  amoxicillin (AMOXIL) 875 MG tablet Take 1 tablet (875 mg total) by mouth 2 (two) times daily. 03/14/20   Wallis Bamberg, PA-C  benzonatate (TESSALON) 100 MG capsule Take 1-2 capsules (100-200 mg total) by mouth 3 (three) times daily as needed. 03/14/20   Wallis Bamberg, PA-C  cetirizine (ZYRTEC ALLERGY) 10 MG tablet Take 1 tablet (10 mg total) by mouth daily. 03/14/20   Wallis Bamberg, PA-C  doxycycline (VIBRAMYCIN) 100 MG capsule Take 1 capsule (100 mg total) by mouth 2 (two) times daily.  Patient not taking: Reported on 06/01/2019 05/18/19   Moshe Cipro, NP  fluticasone [facility]) 50 MCG/ACT nasal spray Place 1 spray into both nostrils daily. Patient not taking: Reported on 01/03/2020 10/29/19   Darr, Gerilyn Pilgrim, PA-C  hydrochlorothiazide (MICROZIDE) 12.5 MG capsule Take 1 capsule (12.5 mg total) by mouth daily. 09/05/19   Hoy Register, MD  hydrOXYzine (ATARAX/VISTARIL) 10 MG tablet TAKE 1-2 TABLETS BY MOUTH ONCE DAILY AS NEEDED FOR ANZIETY 02/06/20   Hoy Register, MD  ibuprofen (ADVIL) 600 MG tablet Take 1 tablet (600 mg total) by mouth every 6 (six) hours as needed. 04/19/20   Mickie Bail, NP  irbesartan (AVAPRO) 150 MG tablet Take 1 tablet (150 mg total) by mouth daily. 09/05/19   Hoy Register, MD  Menthol (CEPACOL SORE THROAT) 5.4 MG LOZG Use as directed 1 lozenge (5.4 mg total) in the mouth or throat every 2 (two) hours as  needed. Patient not taking: Reported on 01/03/2020 10/29/19   Darr, Gerilyn Pilgrim, PA-C  ondansetron (ZOFRAN) 4 MG tablet Take 1 tablet (4 mg total) by mouth every 6 (six) hours as needed for nausea or vomiting. 04/19/20   Mickie Bail, NP  pantoprazole (PROTONIX) 40 MG tablet Take 1 tablet (40 mg total) by mouth daily. 09/05/19   Hoy Register, MD  predniSONE (DELTASONE) 20 MG tablet Take 2 tablets daily with breakfast. 03/14/20   Wallis Bamberg, PA-C  promethazine-dextromethorphan (PROMETHAZINE-DM) 6.25-15 MG/5ML syrup Take 5 mLs by mouth at bedtime as needed for cough. 03/14/20   Wallis Bamberg, PA-C  sodium chloride (OCEAN) 0.65 % SOLN nasal spray Place 1 spray into both nostrils as needed for congestion. Patient not taking: Reported on 01/03/2020 10/29/19   Darr, Gerilyn Pilgrim, PA-C  triamcinolone cream (KENALOG) 0.1 % APPLY APPLICATION TOPICALLY 2 (TWO) TIMES DAILY. Patient not taking: Reported on 06/01/2019 01/12/19   Hoy Register, MD  varenicline (CHANTIX CONTINUING MONTH PAK) 1 MG tablet Take 1 tablet (1 mg total) by mouth 2 (two) times daily. Patient not taking: Reported on 01/03/2020 06/01/19   Anders Simmonds, PA-C  varenicline (CHANTIX) 0.5 MG tablet Take 1 tablet (0.5 mg total) by mouth 2 (two) times daily. 06/01/19   Anders Simmonds, PA-C    Allergies    Patient has no known allergies.  Review of Systems   Review of Systems  Respiratory: Negative for shortness of breath.   Cardiovascular: Negative for chest pain.  Gastrointestinal: Negative for abdominal pain.  Genitourinary: Positive for flank pain.  All other systems reviewed and are negative.   Physical Exam Updated Vital Signs BP 93/71 (BP Location: Left Arm)   Pulse 97   Temp 98.6 F (37 C) (Oral)   Resp 16   LMP 04/06/2020 (Approximate)   SpO2 100%   Physical Exam Vitals and nursing note reviewed.  Constitutional:      General: She is not in acute distress.    Appearance: She is well-developed and well-nourished.  HENT:      Head: Normocephalic and atraumatic.     Mouth/Throat:     Mouth: Oropharynx is clear and moist.  Eyes:     Extraocular Movements: EOM normal.     Conjunctiva/sclera: Conjunctivae normal.     Pupils: Pupils are equal, round, and reactive to light.  Cardiovascular:     Rate and Rhythm: Normal rate and regular rhythm.     Heart sounds: Normal heart sounds.  Pulmonary:     Effort: Pulmonary effort is normal. No respiratory distress.  Breath sounds: Normal breath sounds.  Abdominal:     General: There is no distension.     Palpations: Abdomen is soft.     Tenderness: There is no abdominal tenderness.  Genitourinary:    Comments: Mild left CVAT Musculoskeletal:        General: No deformity or edema. Normal range of motion.     Cervical back: Normal range of motion and neck supple.  Skin:    General: Skin is warm and dry.  Neurological:     Mental Status: She is alert and oriented to person, place, and time.  Psychiatric:        Mood and Affect: Mood and affect normal.     ED Results / Procedures / Treatments   Labs (all labs ordered are listed, but only abnormal results are displayed) Labs Reviewed  COMPREHENSIVE METABOLIC PANEL - Abnormal; Notable for the following components:      Result Value   Sodium 134 (*)    Potassium 3.1 (*)    CO2 17 (*)    Glucose, Bld 137 (*)    Creatinine, Ser 1.45 (*)    GFR, Estimated 47 (*)    All other components within normal limits  CBC - Abnormal; Notable for the following components:   WBC 16.9 (*)    All other components within normal limits  URINALYSIS, ROUTINE W REFLEX MICROSCOPIC - Abnormal; Notable for the following components:   APPearance HAZY (*)    Protein, ur 30 (*)    Nitrite POSITIVE (*)    Leukocytes,Ua TRACE (*)    Bacteria, UA MANY (*)    All other components within normal limits  LIPASE, BLOOD  I-STAT BETA HCG BLOOD, ED ([facility], [facility], [facility] ONLY)    EKG None  Radiology No results found.  Procedures Procedures    Medications Ordered in ED Medications  sodium chloride 0.9 % bolus 1,000 mL (has no administration in time range)  ondansetron (ZOFRAN) injection 4 mg (has no administration in time range)  morphine 4 MG/ML injection 4 mg (has no administration in time range)  cefTRIAXone (ROCEPHIN) 1 g in sodium chloride 0.9 % 100 mL IVPB (has no administration in time range)  ondansetron (ZOFRAN-ODT) disintegrating tablet 4 mg (4 mg Oral Given 04/20/20 4142)    ED Course  I have reviewed the triage vital signs and the nursing notes.  Pertinent labs & imaging results that were available during my care of the patient were reviewed by me and considered in my medical decision making (see chart for details).    MDM Rules/Calculators/A&P                          MDM  Screen complete  Melissa Cooley was evaluated in Emergency Department on 04/20/2020 for the symptoms described in the history of present illness. She was evaluated in the context of the global COVID-19 pandemic, which necessitated consideration that the patient might be at risk for infection with the SARS-CoV-2 virus that causes COVID-19. Institutional protocols and algorithms that pertain to the evaluation of patients at risk for COVID-19 are in a state of rapid change based on information released by regulatory bodies including the CDC and federal and state organizations. These policies and algorithms were followed during the patient's care in the ED.   Patient presents with reported temperature of 101Tm at home, left flank pain, and associated nausea and vomiting.  Work-up is suggestive of infected left ureteral stone.  IV fluids, antiemetics, pain medicine, and antibiotics initiated in ED.  Dr. Benancio Deeds [phone]) of urology is aware of case.  He has plans to stent patient later this afternoon. He would prefer that procedure occur at [facility]. Requests bed placement to [facility] - unless there is lengthy delay, he would prefer to place stent at [facility].    Patient will require admission and placement at [facility] for Urology intervention.  Hospitalist service [facility]) will evaluate for same.  Patient understands plan of care.    Final Clinical Impression(s) / ED Diagnoses Final diagnoses:  Urinary tract infection without hematuria, site unspecified  Ureteral colic    Rx / DC Orders ED Discharge Orders    None       Wynetta Fines, MD 04/20/20 1112

## 2020-04-20 NOTE — Anesthesia Preprocedure Evaluation (Signed)
Anesthesia Evaluation    Airway Mallampati: II       Dental no notable dental hx.    Pulmonary former smoker,    Pulmonary exam normal        Cardiovascular hypertension, Pt. on medications  Rhythm:Regular Rate:Tachycardia     Neuro/Psych PSYCHIATRIC DISORDERS Anxiety Depression    GI/Hepatic   Endo/Other    Renal/GU   negative genitourinary   Musculoskeletal   Abdominal (+) + obese,   Peds  Hematology   Anesthesia Other Findings   Reproductive/Obstetrics                             Anesthesia Physical Anesthesia Plan  ASA: II  Anesthesia Plan: General   Post-op Pain Management:    Induction: Rapid sequence and Cricoid pressure planned  PONV Risk Score and Plan: 4 or greater and Ondansetron, Dexamethasone and Midazolam  Airway Management Planned: Oral ETT  Additional Equipment: None  Intra-op Plan:   Post-operative Plan: Extubation in OR  Informed Consent: I have reviewed the patients History and Physical, chart, labs and discussed the procedure including the risks, benefits and alternatives for the proposed anesthesia with the patient or authorized representative who has indicated his/her understanding and acceptance.     Dental advisory given  Plan Discussed with: CRNA  Anesthesia Plan Comments:         Anesthesia Quick Evaluation

## 2020-04-20 NOTE — Addendum Note (Signed)
Addendum  created 04/20/20 1926 by Shelton Silvas, MD   Clinical Note Signed

## 2020-04-20 NOTE — ED Notes (Signed)
Attempted report 

## 2020-04-20 NOTE — H&P (View-Only) (Signed)
Urology Consult   Physician requesting consult: Joen Laura, MD  Reason for consult: Infected obstructing ureteral stone  History of Present Illness: Melissa Cooley is a 40 y.o. female with new onset left flank pain, vomiting, and fever, found to have a 24mm left proximal obstructing ureteral stone with UTI.   Patient initially presented to the ED last night with vomiting and diarrhea. Returned this morning with worsening flank pain and chills.   Labs notable for leukocytosis, AKI, and UA concerning for infection. She was tachycardic to 150 on arrival. Normtotensive  She denies a history of voiding or storage urinary symptoms, hematuria, UTIs, STDs, urolithiasis, GU malignancy/trauma/surgery.  Past Medical History:  Diagnosis Date  . Anxiety   . Depression   . Gout   . Hypertension     Past Surgical History:  Procedure Laterality Date  . CESAREAN SECTION    . CESAREAN SECTION    . CHOLECYSTECTOMY    . TUBAL LIGATION       Current Hospital Medications:  Home meds:  No current facility-administered medications on file prior to encounter.   Current Outpatient Medications on File Prior to Encounter  Medication Sig Dispense Refill  . acetaminophen (TYLENOL) 500 MG tablet Take 1 tablet (500 mg total) by mouth every 6 (six) hours as needed. (Patient taking differently: Take 500 mg by mouth every 6 (six) hours as needed for mild pain, fever or headache.) 30 tablet 0  . VITAMIN D PO Take 1 capsule by mouth daily.    Marland Kitchen amoxicillin (AMOXIL) 875 MG tablet Take 1 tablet (875 mg total) by mouth 2 (two) times daily. (Patient not taking: Reported on 04/20/2020) 14 tablet 0  . benzonatate (TESSALON) 100 MG capsule Take 1-2 capsules (100-200 mg total) by mouth 3 (three) times daily as needed. (Patient not taking: Reported on 04/20/2020) 60 capsule 0  . cetirizine (ZYRTEC ALLERGY) 10 MG tablet Take 1 tablet (10 mg total) by mouth daily. (Patient not taking: Reported on 04/20/2020) 30 tablet 0   . hydrochlorothiazide (MICROZIDE) 12.5 MG capsule Take 1 capsule (12.5 mg total) by mouth daily. (Patient not taking: Reported on 04/20/2020) 90 capsule 1  . hydrOXYzine (ATARAX/VISTARIL) 10 MG tablet TAKE 1-2 TABLETS BY MOUTH ONCE DAILY AS NEEDED FOR ANZIETY (Patient not taking: Reported on 04/20/2020) 60 tablet 0  . ibuprofen (ADVIL) 600 MG tablet Take 1 tablet (600 mg total) by mouth every 6 (six) hours as needed. 30 tablet 0  . irbesartan (AVAPRO) 150 MG tablet Take 1 tablet (150 mg total) by mouth daily. (Patient not taking: Reported on 04/20/2020) 90 tablet 1  . ondansetron (ZOFRAN) 4 MG tablet Take 1 tablet (4 mg total) by mouth every 6 (six) hours as needed for nausea or vomiting. 12 tablet 0  . predniSONE (DELTASONE) 20 MG tablet Take 2 tablets daily with breakfast. (Patient not taking: Reported on 04/20/2020) 10 tablet 0  . promethazine-dextromethorphan (PROMETHAZINE-DM) 6.25-15 MG/5ML syrup Take 5 mLs by mouth at bedtime as needed for cough. (Patient not taking: Reported on 04/20/2020) 100 mL 0     Scheduled Meds: . acetaminophen  1,000 mg Oral Once  . enoxaparin (LOVENOX) injection  60 mg Subcutaneous Q24H  . potassium chloride  40 mEq Oral BID   Continuous Infusions: . cefTRIAXone (ROCEPHIN)  IV    . lactated ringers     PRN Meds:.acetaminophen **OR** acetaminophen, labetalol, ondansetron **OR** ondansetron (ZOFRAN) IV, oxyCODONE  Allergies: No Known Allergies  Family History  Problem Relation Age of Onset  .  Cancer Mother   . Drug abuse Mother   . Bipolar disorder Mother   . Breast cancer Mother 81  . Diabetes Father   . Hypertension Other   . Anxiety disorder Sister   . Drug abuse Brother   . Lupus Maternal Aunt     Social History:  reports that she has quit smoking. She has never used smokeless tobacco. She reports that she does not drink alcohol and does not use drugs.  ROS: A complete review of systems was performed.  All systems are negative except for pertinent  findings as noted.  Physical Exam:  Vital signs in last 24 hours: Temp:  [98.6 F (37 C)-99.2 F (37.3 C)] 99 F (37.2 C) (02/04 1333) Pulse Rate:  [79-148] 118 (02/04 1333) Resp:  [16-34] 25 (02/04 1333) BP: (93-142)/(48-89) 96/48 (02/04 1333) SpO2:  [93 %-100 %] 100 % (02/04 1333) Weight:  [120.2 kg] 120.2 kg (02/04 0830) Constitutional:  Alert and oriented, No acute distress Cardiovascular: Regular rate and rhythm, No JVD Respiratory: Normal respiratory effort, Lungs clear bilaterally GI: Abdomen is soft, nontender, nondistended, no abdominal masses GU: No CVA tenderness Lymphatic: No lymphadenopathy Neurologic: Grossly intact, no focal deficits Psychiatric: Normal mood and affect  Laboratory Data:  Recent Labs    04/20/20 0659  WBC 16.9*  HGB 13.9  HCT 40.0  PLT 301    Recent Labs    04/20/20 0659  NA 134*  K 3.1*  CL 104  GLUCOSE 137*  BUN 15  CALCIUM 9.0  CREATININE 1.45*     Results for orders placed or performed during the hospital encounter of 04/20/20 (from the past 24 hour(s))  Urinalysis, Routine w reflex microscopic Urine, Clean Catch     Status: Abnormal   Collection Time: 04/20/20  6:54 AM  Result Value Ref Range   Color, Urine YELLOW YELLOW   APPearance HAZY (A) CLEAR   Specific Gravity, Urine 1.024 1.005 - 1.030   pH 5.0 5.0 - 8.0   Glucose, UA NEGATIVE NEGATIVE mg/dL   Hgb urine dipstick NEGATIVE NEGATIVE   Bilirubin Urine NEGATIVE NEGATIVE   Ketones, ur NEGATIVE NEGATIVE mg/dL   Protein, ur 30 (A) NEGATIVE mg/dL   Nitrite POSITIVE (A) NEGATIVE   Leukocytes,Ua TRACE (A) NEGATIVE   RBC / HPF 6-10 0 - 5 RBC/hpf   WBC, UA 21-50 0 - 5 WBC/hpf   Bacteria, UA MANY (A) NONE SEEN   Squamous Epithelial / LPF 6-10 0 - 5  Lipase, blood     Status: None   Collection Time: 04/20/20  6:59 AM  Result Value Ref Range   Lipase 28 11 - 51 U/L  Comprehensive metabolic panel     Status: Abnormal   Collection Time: 04/20/20  6:59 AM  Result Value Ref  Range   Sodium 134 (L) 135 - 145 mmol/L   Potassium 3.1 (L) 3.5 - 5.1 mmol/L   Chloride 104 98 - 111 mmol/L   CO2 17 (L) 22 - 32 mmol/L   Glucose, Bld 137 (H) 70 - 99 mg/dL   BUN 15 6 - 20 mg/dL   Creatinine, Ser 7.51 (H) 0.44 - 1.00 mg/dL   Calcium 9.0 8.9 - 02.5 mg/dL   Total Protein 7.5 6.5 - 8.1 g/dL   Albumin 3.7 3.5 - 5.0 g/dL   AST 32 15 - 41 U/L   ALT 27 0 - 44 U/L   Alkaline Phosphatase 70 38 - 126 U/L   Total Bilirubin 1.1 0.3 - 1.2  mg/dL   GFR, Estimated 47 (L) >60 mL/min   Anion gap 13 5 - 15  CBC     Status: Abnormal   Collection Time: 04/20/20  6:59 AM  Result Value Ref Range   WBC 16.9 (H) 4.0 - 10.5 K/uL   RBC 4.42 3.87 - 5.11 MIL/uL   Hemoglobin 13.9 12.0 - 15.0 g/dL   HCT 37.1 06.2 - 69.4 %   MCV 90.5 80.0 - 100.0 fL   MCH 31.4 26.0 - 34.0 pg   MCHC 34.8 30.0 - 36.0 g/dL   RDW 85.4 62.7 - 03.5 %   Platelets 301 150 - 400 K/uL   nRBC 0.0 0.0 - 0.2 %  I-Stat beta hCG blood, ED     Status: None   Collection Time: 04/20/20  7:11 AM  Result Value Ref Range   I-stat hCG, quantitative <5.0 <5 mIU/mL   Comment 3           Recent Results (from the past 240 hour(s))  SARS CORONAVIRUS 2 (TAT 6-24 HRS) Nasopharyngeal Nasopharyngeal Swab     Status: None   Collection Time: 04/19/20  9:12 PM   Specimen: Nasopharyngeal Swab  Result Value Ref Range Status   SARS Coronavirus 2 NEGATIVE NEGATIVE Final    Comment: (NOTE) SARS-CoV-2 target nucleic acids are NOT DETECTED.  The SARS-CoV-2 RNA is generally detectable in upper and lower respiratory specimens during the acute phase of infection. Negative results do not preclude SARS-CoV-2 infection, do not rule out co-infections with other pathogens, and should not be used as the sole basis for treatment or other patient management decisions. Negative results must be combined with clinical observations, patient history, and epidemiological information. The expected result is Negative.  Fact Sheet for  Patients: HairSlick.no  Fact Sheet for Healthcare Providers: quierodirigir.com  This test is not yet approved or cleared by the [place] FDA and  has been authorized for detection and/or diagnosis of SARS-CoV-2 by FDA under an Emergency Use Authorization (EUA). This EUA will remain  in effect (meaning this test can be used) for the duration of the COVID-19 declaration under Se ction 564(b)(1) of the Act, 21 U.S.C. section 360bbb-3(b)(1), unless the authorization is terminated or revoked sooner.  Performed at [facility], [address], [place], [place] [address]     Renal Function: Recent Labs    04/20/20 1829  CREATININE 1.45*   Estimated Creatinine Clearance: 74.5 mL/min (A) (by C-G formula based on SCr of 1.45 mg/dL (H)).  Radiologic Imaging: CT ABDOMEN PELVIS W CONTRAST  Result Date: 04/20/2020 CLINICAL DATA:  Left flank pain. Kidney stone suspected. Suspected pyelonephritis. Vomiting and fever. EXAM: CT ABDOMEN AND PELVIS WITH CONTRAST TECHNIQUE: Multidetector CT imaging of the abdomen and pelvis was performed using the standard protocol following bolus administration of intravenous contrast. CONTRAST:  OMNIPAQUE IOHEXOL 300 MG/ML  SOLN COMPARISON:  None. FINDINGS: Lower chest: Mild atelectasis in the left base. No other acute abnormalities in the lower chest. Hepatobiliary: No focal liver abnormality is seen. Status post cholecystectomy. No biliary dilatation. Pancreas: Unremarkable. No pancreatic ductal dilatation or surrounding inflammatory changes. Spleen: Normal in size without focal abnormality. Adrenals/Urinary Tract: There is a 1 or 2 mm stone in the right kidney seen on coronal image 94. A probable punctate stone is seen in the right kidney on coronal image 81. No masses, hydronephrosis, or perinephric stranding on the right. The right ureter is normal. The left kidney is enlarged compared to the right  with less enhancement of the left renal cortex compared to the right. No striated nephrogram identified. There is a 2 mm stone in the left kidney seen on coronal image 70. No other stones are seen in the left kidney. There is moderate hydronephrosis and perinephric stranding on the left. The proximal left ureter is dilated. There is a 5 mm stone in the proximal left ureter seen on coronal image 75. The remainder of the left ureter is unremarkable and on dilated with no additional stones identified. The bladder is normal. Stomach/Bowel: Stomach is within normal limits. Appendix appears normal. No evidence of bowel wall thickening, distention, or inflammatory changes. Vascular/Lymphatic: No significant vascular findings are present. No enlarged abdominal or pelvic lymph nodes. Reproductive: Uterus and bilateral adnexa are unremarkable. Other: No abdominal wall hernia or abnormality. No abdominopelvic ascites. Musculoskeletal: No acute or significant osseous findings. IMPRESSION: 1. There is a 5 mm stone in the proximal left ureter resulting in ureterectasis, moderate hydronephrosis, and moderate perinephric stranding. There is also decreased enhancement in the left renal cortex versus the right suggesting significant obstruction. No abscess. No striated nephrogram to confirm the clinical suspicion of pyelonephritis. 2. Small stones in both kidneys as above. Electronically Signed   By: Gerome Sam III M.D   On: 04/20/2020 09:42    I independently reviewed the above imaging studies.  Impression/Recommendation: Left ureteral calculus with possible early urosepsis. Plan/Rec: Cysto insertion of Left JJ stent Urine/blood Cx/Abx/ await C/s results  Thea Alken 04/20/2020, 2:33 PM   Addendum: I have reviewed history examined patient, agree with assessment and plan and plan for urgent placement of left JJ stent.  Risks and benefits of procedure were discussed in detail with the patient today. Dr Benancio Deeds

## 2020-04-20 NOTE — Consult Note (Addendum)
Urology Consult   Physician requesting consult: Joen Laura, MD  Reason for consult: Infected obstructing ureteral stone  History of Present Illness: Melissa Cooley is a 40 y.o. female with new onset left flank pain, vomiting, and fever, found to have a 24mm left proximal obstructing ureteral stone with UTI.   Patient initially presented to the ED last night with vomiting and diarrhea. Returned this morning with worsening flank pain and chills.   Labs notable for leukocytosis, AKI, and UA concerning for infection. She was tachycardic to 150 on arrival. Normtotensive  She denies a history of voiding or storage urinary symptoms, hematuria, UTIs, STDs, urolithiasis, GU malignancy/trauma/surgery.  Past Medical History:  Diagnosis Date  . Anxiety   . Depression   . Gout   . Hypertension     Past Surgical History:  Procedure Laterality Date  . CESAREAN SECTION    . CESAREAN SECTION    . CHOLECYSTECTOMY    . TUBAL LIGATION       Current Hospital Medications:  Home meds:  No current facility-administered medications on file prior to encounter.   Current Outpatient Medications on File Prior to Encounter  Medication Sig Dispense Refill  . acetaminophen (TYLENOL) 500 MG tablet Take 1 tablet (500 mg total) by mouth every 6 (six) hours as needed. (Patient taking differently: Take 500 mg by mouth every 6 (six) hours as needed for mild pain, fever or headache.) 30 tablet 0  . VITAMIN D PO Take 1 capsule by mouth daily.    Marland Kitchen amoxicillin (AMOXIL) 875 MG tablet Take 1 tablet (875 mg total) by mouth 2 (two) times daily. (Patient not taking: Reported on 04/20/2020) 14 tablet 0  . benzonatate (TESSALON) 100 MG capsule Take 1-2 capsules (100-200 mg total) by mouth 3 (three) times daily as needed. (Patient not taking: Reported on 04/20/2020) 60 capsule 0  . cetirizine (ZYRTEC ALLERGY) 10 MG tablet Take 1 tablet (10 mg total) by mouth daily. (Patient not taking: Reported on 04/20/2020) 30 tablet 0   . hydrochlorothiazide (MICROZIDE) 12.5 MG capsule Take 1 capsule (12.5 mg total) by mouth daily. (Patient not taking: Reported on 04/20/2020) 90 capsule 1  . hydrOXYzine (ATARAX/VISTARIL) 10 MG tablet TAKE 1-2 TABLETS BY MOUTH ONCE DAILY AS NEEDED FOR ANZIETY (Patient not taking: Reported on 04/20/2020) 60 tablet 0  . ibuprofen (ADVIL) 600 MG tablet Take 1 tablet (600 mg total) by mouth every 6 (six) hours as needed. 30 tablet 0  . irbesartan (AVAPRO) 150 MG tablet Take 1 tablet (150 mg total) by mouth daily. (Patient not taking: Reported on 04/20/2020) 90 tablet 1  . ondansetron (ZOFRAN) 4 MG tablet Take 1 tablet (4 mg total) by mouth every 6 (six) hours as needed for nausea or vomiting. 12 tablet 0  . predniSONE (DELTASONE) 20 MG tablet Take 2 tablets daily with breakfast. (Patient not taking: Reported on 04/20/2020) 10 tablet 0  . promethazine-dextromethorphan (PROMETHAZINE-DM) 6.25-15 MG/5ML syrup Take 5 mLs by mouth at bedtime as needed for cough. (Patient not taking: Reported on 04/20/2020) 100 mL 0     Scheduled Meds: . acetaminophen  1,000 mg Oral Once  . enoxaparin (LOVENOX) injection  60 mg Subcutaneous Q24H  . potassium chloride  40 mEq Oral BID   Continuous Infusions: . cefTRIAXone (ROCEPHIN)  IV    . lactated ringers     PRN Meds:.acetaminophen **OR** acetaminophen, labetalol, ondansetron **OR** ondansetron (ZOFRAN) IV, oxyCODONE  Allergies: No Known Allergies  Family History  Problem Relation Age of Onset  .  Cancer Mother   . Drug abuse Mother   . Bipolar disorder Mother   . Breast cancer Mother 81  . Diabetes Father   . Hypertension Other   . Anxiety disorder Sister   . Drug abuse Brother   . Lupus Maternal Aunt     Social History:  reports that she has quit smoking. She has never used smokeless tobacco. She reports that she does not drink alcohol and does not use drugs.  ROS: A complete review of systems was performed.  All systems are negative except for pertinent  findings as noted.  Physical Exam:  Vital signs in last 24 hours: Temp:  [98.6 F (37 C)-99.2 F (37.3 C)] 99 F (37.2 C) (02/04 1333) Pulse Rate:  [79-148] 118 (02/04 1333) Resp:  [16-34] 25 (02/04 1333) BP: (93-142)/(48-89) 96/48 (02/04 1333) SpO2:  [93 %-100 %] 100 % (02/04 1333) Weight:  [120.2 kg] 120.2 kg (02/04 0830) Constitutional:  Alert and oriented, No acute distress Cardiovascular: Regular rate and rhythm, No JVD Respiratory: Normal respiratory effort, Lungs clear bilaterally GI: Abdomen is soft, nontender, nondistended, no abdominal masses GU: No CVA tenderness Lymphatic: No lymphadenopathy Neurologic: Grossly intact, no focal deficits Psychiatric: Normal mood and affect  Laboratory Data:  Recent Labs    04/20/20 0659  WBC 16.9*  HGB 13.9  HCT 40.0  PLT 301    Recent Labs    04/20/20 0659  NA 134*  K 3.1*  CL 104  GLUCOSE 137*  BUN 15  CALCIUM 9.0  CREATININE 1.45*     Results for orders placed or performed during the hospital encounter of 04/20/20 (from the past 24 hour(s))  Urinalysis, Routine w reflex microscopic Urine, Clean Catch     Status: Abnormal   Collection Time: 04/20/20  6:54 AM  Result Value Ref Range   Color, Urine YELLOW YELLOW   APPearance HAZY (A) CLEAR   Specific Gravity, Urine 1.024 1.005 - 1.030   pH 5.0 5.0 - 8.0   Glucose, UA NEGATIVE NEGATIVE mg/dL   Hgb urine dipstick NEGATIVE NEGATIVE   Bilirubin Urine NEGATIVE NEGATIVE   Ketones, ur NEGATIVE NEGATIVE mg/dL   Protein, ur 30 (A) NEGATIVE mg/dL   Nitrite POSITIVE (A) NEGATIVE   Leukocytes,Ua TRACE (A) NEGATIVE   RBC / HPF 6-10 0 - 5 RBC/hpf   WBC, UA 21-50 0 - 5 WBC/hpf   Bacteria, UA MANY (A) NONE SEEN   Squamous Epithelial / LPF 6-10 0 - 5  Lipase, blood     Status: None   Collection Time: 04/20/20  6:59 AM  Result Value Ref Range   Lipase 28 11 - 51 U/L  Comprehensive metabolic panel     Status: Abnormal   Collection Time: 04/20/20  6:59 AM  Result Value Ref  Range   Sodium 134 (L) 135 - 145 mmol/L   Potassium 3.1 (L) 3.5 - 5.1 mmol/L   Chloride 104 98 - 111 mmol/L   CO2 17 (L) 22 - 32 mmol/L   Glucose, Bld 137 (H) 70 - 99 mg/dL   BUN 15 6 - 20 mg/dL   Creatinine, Ser 7.51 (H) 0.44 - 1.00 mg/dL   Calcium 9.0 8.9 - 02.5 mg/dL   Total Protein 7.5 6.5 - 8.1 g/dL   Albumin 3.7 3.5 - 5.0 g/dL   AST 32 15 - 41 U/L   ALT 27 0 - 44 U/L   Alkaline Phosphatase 70 38 - 126 U/L   Total Bilirubin 1.1 0.3 - 1.2  mg/dL   GFR, Estimated 47 (L) >60 mL/min   Anion gap 13 5 - 15  CBC     Status: Abnormal   Collection Time: 04/20/20  6:59 AM  Result Value Ref Range   WBC 16.9 (H) 4.0 - 10.5 K/uL   RBC 4.42 3.87 - 5.11 MIL/uL   Hemoglobin 13.9 12.0 - 15.0 g/dL   HCT 37.1 06.2 - 69.4 %   MCV 90.5 80.0 - 100.0 fL   MCH 31.4 26.0 - 34.0 pg   MCHC 34.8 30.0 - 36.0 g/dL   RDW 85.4 62.7 - 03.5 %   Platelets 301 150 - 400 K/uL   nRBC 0.0 0.0 - 0.2 %  I-Stat beta hCG blood, ED     Status: None   Collection Time: 04/20/20  7:11 AM  Result Value Ref Range   I-stat hCG, quantitative <5.0 <5 mIU/mL   Comment 3           Recent Results (from the past 240 hour(s))  SARS CORONAVIRUS 2 (TAT 6-24 HRS) Nasopharyngeal Nasopharyngeal Swab     Status: None   Collection Time: 04/19/20  9:12 PM   Specimen: Nasopharyngeal Swab  Result Value Ref Range Status   SARS Coronavirus 2 NEGATIVE NEGATIVE Final    Comment: (NOTE) SARS-CoV-2 target nucleic acids are NOT DETECTED.  The SARS-CoV-2 RNA is generally detectable in upper and lower respiratory specimens during the acute phase of infection. Negative results do not preclude SARS-CoV-2 infection, do not rule out co-infections with other pathogens, and should not be used as the sole basis for treatment or other patient management decisions. Negative results must be combined with clinical observations, patient history, and epidemiological information. The expected result is Negative.  Fact Sheet for  Patients: HairSlick.no  Fact Sheet for Healthcare Providers: quierodirigir.com  This test is not yet approved or cleared by the [place] FDA and  has been authorized for detection and/or diagnosis of SARS-CoV-2 by FDA under an Emergency Use Authorization (EUA). This EUA will remain  in effect (meaning this test can be used) for the duration of the COVID-19 declaration under Se ction 564(b)(1) of the Act, 21 U.S.C. section 360bbb-3(b)(1), unless the authorization is terminated or revoked sooner.  Performed at [facility], [address], [place], [place] [address]     Renal Function: Recent Labs    04/20/20 1829  CREATININE 1.45*   Estimated Creatinine Clearance: 74.5 mL/min (A) (by C-G formula based on SCr of 1.45 mg/dL (H)).  Radiologic Imaging: CT ABDOMEN PELVIS W CONTRAST  Result Date: 04/20/2020 CLINICAL DATA:  Left flank pain. Kidney stone suspected. Suspected pyelonephritis. Vomiting and fever. EXAM: CT ABDOMEN AND PELVIS WITH CONTRAST TECHNIQUE: Multidetector CT imaging of the abdomen and pelvis was performed using the standard protocol following bolus administration of intravenous contrast. CONTRAST:  OMNIPAQUE IOHEXOL 300 MG/ML  SOLN COMPARISON:  None. FINDINGS: Lower chest: Mild atelectasis in the left base. No other acute abnormalities in the lower chest. Hepatobiliary: No focal liver abnormality is seen. Status post cholecystectomy. No biliary dilatation. Pancreas: Unremarkable. No pancreatic ductal dilatation or surrounding inflammatory changes. Spleen: Normal in size without focal abnormality. Adrenals/Urinary Tract: There is a 1 or 2 mm stone in the right kidney seen on coronal image 94. A probable punctate stone is seen in the right kidney on coronal image 81. No masses, hydronephrosis, or perinephric stranding on the right. The right ureter is normal. The left kidney is enlarged compared to the right  with less enhancement of the left renal cortex compared to the right. No striated nephrogram identified. There is a 2 mm stone in the left kidney seen on coronal image 70. No other stones are seen in the left kidney. There is moderate hydronephrosis and perinephric stranding on the left. The proximal left ureter is dilated. There is a 5 mm stone in the proximal left ureter seen on coronal image 75. The remainder of the left ureter is unremarkable and on dilated with no additional stones identified. The bladder is normal. Stomach/Bowel: Stomach is within normal limits. Appendix appears normal. No evidence of bowel wall thickening, distention, or inflammatory changes. Vascular/Lymphatic: No significant vascular findings are present. No enlarged abdominal or pelvic lymph nodes. Reproductive: Uterus and bilateral adnexa are unremarkable. Other: No abdominal wall hernia or abnormality. No abdominopelvic ascites. Musculoskeletal: No acute or significant osseous findings. IMPRESSION: 1. There is a 5 mm stone in the proximal left ureter resulting in ureterectasis, moderate hydronephrosis, and moderate perinephric stranding. There is also decreased enhancement in the left renal cortex versus the right suggesting significant obstruction. No abscess. No striated nephrogram to confirm the clinical suspicion of pyelonephritis. 2. Small stones in both kidneys as above. Electronically Signed   By: David  Williams III M.D   On: 04/20/2020 09:42    I independently reviewed the above imaging studies.  Impression/Recommendation: Left ureteral calculus with possible early urosepsis. Plan/Rec: Cysto insertion of Left JJ stent Urine/blood Cx/Abx/ await C/s results  Christine Nikas 04/20/2020, 2:33 PM   Addendum: I have reviewed history examined patient, agree with assessment and plan and plan for urgent placement of left JJ stent.  Risks and benefits of procedure were discussed in detail with the patient today. Dr Shaivi Rothschild 

## 2020-04-20 NOTE — Progress Notes (Signed)
RT was called in the room to assess the patient. Pt denies SOB and her O2 saturation was 97 % on NRB. She said she felt weird and SOB for couple minutes but comfortable now. She was talking to me in full sentences and using her Cell phone. Pt said she feels fine now and resting. BIPAP SB in the room.  RN and MD aware.

## 2020-04-20 NOTE — Progress Notes (Signed)
Called left message to Pt's daughter Doreatha Lew [phone].  pt tx to 1225  Albumin finished, 2g mag++ finished before tx. Increased Neo gtts to 50 mcg.   updated room number and visiting hrs with daughter via phone.

## 2020-04-20 NOTE — Progress Notes (Signed)
eLink Physician-Brief Progress Note Patient Name: Melissa Cooley DOB: [birth year] MRN: [id number]   Date of Service  04/20/2020  HPI/Events of Note  K+ 3.0, has received some replacement already.  eICU Interventions  KCL 40 meq po x 1 at 4 a.m.        [name] 04/20/2020, 10:28 PM

## 2020-04-20 NOTE — Sepsis Progress Note (Signed)
Pt taken to OR. Will continue to follow Code Sepsis once patent back from surgery.

## 2020-04-21 ENCOUNTER — Encounter (HOSPITAL_COMMUNITY): Payer: Self-pay | Admitting: Urology

## 2020-04-21 ENCOUNTER — Inpatient Hospital Stay (HOSPITAL_COMMUNITY): Payer: Medicaid Other

## 2020-04-21 DIAGNOSIS — N10 Acute tubulo-interstitial nephritis: Secondary | ICD-10-CM

## 2020-04-21 DIAGNOSIS — E876 Hypokalemia: Secondary | ICD-10-CM

## 2020-04-21 DIAGNOSIS — D649 Anemia, unspecified: Secondary | ICD-10-CM

## 2020-04-21 DIAGNOSIS — N179 Acute kidney failure, unspecified: Secondary | ICD-10-CM

## 2020-04-21 HISTORY — DX: Hypokalemia: E87.6

## 2020-04-21 LAB — BASIC METABOLIC PANEL
Anion gap: 12 (ref 5–15)
BUN: 29 mg/dL — ABNORMAL HIGH (ref 6–20)
CO2: 17 mmol/L — ABNORMAL LOW (ref 22–32)
Calcium: 7.5 mg/dL — ABNORMAL LOW (ref 8.9–10.3)
Chloride: 109 mmol/L (ref 98–111)
Creatinine, Ser: 1.8 mg/dL — ABNORMAL HIGH (ref 0.44–1.00)
GFR, Estimated: 36 mL/min — ABNORMAL LOW (ref >60)
Glucose, Bld: 138 mg/dL — ABNORMAL HIGH (ref 70–99)
Potassium: 4.1 mmol/L (ref 3.5–5.1)
Sodium: 138 mmol/L (ref 135–145)

## 2020-04-21 LAB — CBC
HCT: 31.2 % — ABNORMAL LOW (ref 36.0–46.0)
Hemoglobin: 10.5 g/dL — ABNORMAL LOW (ref 12.0–15.0)
MCH: 31.3 pg (ref 26.0–34.0)
MCHC: 33.7 g/dL (ref 30.0–36.0)
MCV: 92.9 fL (ref 80.0–100.0)
Platelets: 141 10*3/uL — ABNORMAL LOW (ref 150–400)
RBC: 3.36 MIL/uL — ABNORMAL LOW (ref 3.87–5.11)
RDW: 14.7 % (ref 11.5–15.5)
WBC: 17.4 10*3/uL — ABNORMAL HIGH (ref 4.0–10.5)
nRBC: 0 % (ref 0.0–0.2)

## 2020-04-21 LAB — MRSA PCR SCREENING: MRSA by PCR: NEGATIVE

## 2020-04-21 LAB — MAGNESIUM: Magnesium: 1.9 mg/dL (ref 1.7–2.4)

## 2020-04-21 LAB — HIV ANTIBODY (ROUTINE TESTING W REFLEX): HIV Screen 4th Generation wRfx: NONREACTIVE

## 2020-04-21 LAB — GLUCOSE, CAPILLARY: Glucose-Capillary: 140 mg/dL — ABNORMAL HIGH (ref 70–99)

## 2020-04-21 MED ORDER — MELATONIN 3 MG PO TABS: 6.0000 mg | ORAL_TABLET | Freq: Every evening | ORAL | Status: DC | PRN

## 2020-04-21 MED ORDER — MAGNESIUM SULFATE 2 GM/50ML IV SOLN
2.0000 g | Freq: Once | INTRAVENOUS | Status: AC
  Administered 2020-04-21: 2 g via INTRAVENOUS
  Filled 2020-04-21: qty 50

## 2020-04-21 MED ORDER — SODIUM CHLORIDE 0.9% FLUSH
10.0000 mL | Freq: Two times a day (BID) | INTRAVENOUS | Status: DC
  Administered 2020-04-21 – 2020-04-22 (×4): 10 mL

## 2020-04-21 MED ORDER — GUAIFENESIN-DM 100-10 MG/5ML PO SYRP
5.0000 mL | ORAL_SOLUTION | ORAL | Status: DC | PRN
  Administered 2020-04-21 – 2020-04-22 (×3): 5 mL via ORAL
  Filled 2020-04-21 (×3): qty 10

## 2020-04-21 MED ORDER — PROCHLORPERAZINE EDISYLATE 10 MG/2ML IJ SOLN: 10.0000 mg | INTRAMUSCULAR | Status: DC | PRN

## 2020-04-21 MED ORDER — SODIUM CHLORIDE 0.9% FLUSH
10.0000 mL | INTRAVENOUS | Status: DC | PRN
Start: 2020-04-21 — End: 2020-04-23

## 2020-04-21 MED ORDER — PROMETHAZINE HCL 25 MG/ML IJ SOLN
25.0000 mg | Freq: Four times a day (QID) | INTRAMUSCULAR | Status: DC | PRN
  Administered 2020-04-21: 25 mg via INTRAVENOUS
  Filled 2020-04-21: qty 1

## 2020-04-21 NOTE — Consult Note (Signed)
NAMEAngelo Cooley, MRN:  [id number], DOB:  [birth year], LOS: 1 ADMISSION DATE:  04/20/2020, CONSULTATION DATE: 04/21/20 REFERRING MD:  Melissa Cooley CHIEF COMPLAINT:  Septic shock, need for pressor initation   Brief History:  40 year old female who initially presented to [facility] ED 04/19/20 with acute onset of L flank pain, vomiting and fever; found to have 8mm L proximal obstructing ureteral stone with associated UTI. Underwent cysto L JJ ureteral stent insertion 2/4. Post-procedure, patient became hypotensive to SBP 80s (MAP 75) in PACU despite fluid boluses and phenylephrine. PCCM was consulted for possible septic shock 2/2 urosepsis and possible pressor initiation.  History of Present Illness:  Melissa Cooley is a 40 year old female with PMH significant for HTN, gout and anxiety/depression who initially presented to [facility] ED 2/3 for fever, vomiting and acute onset of new L flank pain. She left the ED 2/3PM but returned 2/4AM with worsening symptoms. On arrival, patient's labs were notable for WBC 16.9, Cr 1.4 (baseline 0.7-0.9), lactate 4.8. UA demonstrated positive nitrites, trace leuks and bacteria. UCx and BCx were sent. CT A/P was completed 2/4 demonstrating an obstructing 42mm stone in the proximal L ureter. She subsequently underwent cysto placement of a L JJ ureteral stent for relief of the obstruction. Post-procedure in recovery, patient became hypotensive to SBP 80s and fluid boluses/phenylephrine were initiated. PCCM was consulted for septic shock 2/2 likely urosepsis and possible initiation of levophed.  Patient seen and examined in PACU. History as documented above. Currently on 50 mics of phenylephrine. Initial mean arterial pressure 58. Orders have been given for transition to Levophed she has 2 good peripheral IVs I think we can continue vasopressors peripherally. If the increase in she does not respond to additional fluid resuscitation may need to consider central venous catheter placement  would like to avoid this if at all possible. Patient would also like to avoid this after discussing with her at bedside. Otherwise her abdominal pain is better than where she was before.  Past Medical History:   Past Medical History:  Diagnosis Date  . Anxiety   . Depression   . Gout   . Hypertension    Significant Hospital Events:  2/3 Presented to [facility] ED for L flank pain/vomiting/fever, obstructing ureteral stone identified 2/4 Cysto insertion of L JJ ureteral stent  Consults:    Procedures:  2/4 L JJ Ureteral Stent Placement  Significant Diagnostic Tests:   2/4 CT A/P >> 17mm stone in proximal L ureter resulting in ureterectasis, moderate hydronephrosis, moderate perinephric stranding; decreased enhancement in the L renal cortex suggesting significant obstruction; no abscess  Micro Data:  2/3 COVID >> negative 2/4 UCx >> 2/4 BC x 2 >>  Antimicrobials:  Ceftriaxone 2/4 >>  Interim History / Subjective:   Issues with shortness of breath overnight.  Had O2 supplementation added due to desaturations.  Central line placed last night still remains on vasopressors  Objective   Blood pressure 100/75, pulse 99, temperature 97.7 F (36.5 C), temperature source Oral, resp. rate (!) 25, height 5\' 11"  (1.803 m), weight 117 kg, last menstrual period 04/06/2020, SpO2 99 %.        Intake/Output Summary (Last 24 hours) at 04/21/2020 0757 Last data filed at 04/21/2020 0600 Gross per 24 hour  Intake 4424.44 ml  Output 425 ml  Net 3999.44 ml   Filed Weights   04/20/20 0830 04/20/20 2035  Weight: 120.2 kg 117 kg    Examination: General: Young female resting in bed  HENT: NCAT tracking appropriately Lungs: Diminished breath sounds no crackles no wheeze Cardiovascular: Tachycardic, regular, S1-S2 Abdomen: Soft, nontender nondistended  Extremities: No significant edema Neuro: Alert oriented following commands no focal deficit GU: Foley in place  Resolved Hospital Problem list      Assessment & Plan:   Septic shock Urinary tract infection, left obstructed ureteral stone status post, double-J stent placement and Foley by urology. Lactic acidosis secondary to above Leukocytosis secondary to above Bilateral pulmonary edema, vascular congestion related to volume resuscitation for sepsis, positive cumulative fluid balance Plan: Patient has had enough volume resuscitation. Remains on low-dose vasopressors. CVL placed last night. Titrate norepinephrine to maintain mean arterial pressure greater than 65 mmHg Stop maintenance fluids.  Let patient eat and drink. If urine output does not pick up can consider Lasix to help get back to euvolemic state. I suspect she will be off pressors by mid day.  Acute renal failure, AKI secondary to sepsis and hypotension Plan: Kidney function is improving from last not checked at this morning. Supportive care avoid nephrotoxic agents Maintain mean arterial pressure above 65 mmHg  Hyponatremia Plan: Continue to observe, likely related to sepsis state, pain, SIADH  Hypokalemia Plan:  Repletion Check BMP  Hypomagnesemia Plan: Again 2 g IV mag Goal greater than 2  Pain control, nausea Plan: Postop orders per urology As needed Zofran, morphine    Best practice (evaluated daily)  Diet: Advance per urology Pain/Anxiety/Delirium protocol (if indicated): Postop orders per urology VAP protocol (if indicated): Not applicable DVT prophylaxis: Lovenox GI prophylaxis: Not applicable Glucose control: SSI Mobility: Bedrest Disposition: Stepdown unit  Goals of Care:  Last date of multidisciplinary goals of care discussion:N/a Family and staff present: N/a Summary of discussion: N/a Follow up goals of care discussion due: N/a Code Status: Full   Labs   CBC: Recent Labs  Lab 04/20/20 0659 04/21/20 0530  WBC 16.9* 17.4*  HGB 13.9 10.5*  HCT 40.0 31.2*  MCV 90.5 92.9  PLT 301 141*    Basic Metabolic  Panel: Recent Labs  Lab 04/20/20 0659 04/20/20 1419 04/20/20 1907 04/21/20 0530  NA 134*  --  138 138  K 3.1*  --  3.0* 4.1  CL 104  --  107 109  CO2 17*  --  16* 17*  GLUCOSE 137*  --  115* 138*  BUN 15  --  23* 29*  CREATININE 1.45*  --  2.50* 1.80*  CALCIUM 9.0  --  7.2* 7.5*  MG  --  1.3*  --  1.9   GFR: Estimated Creatinine Clearance: 59.2 mL/min (A) (by C-G formula based on SCr of 1.8 mg/dL (H)). Recent Labs  Lab 04/20/20 0659 04/20/20 1412 04/20/20 2143 04/21/20 0530  WBC 16.9*  --   --  17.4*  LATICACIDVEN  --  4.8* 5.5*  --     Liver Function Tests: Recent Labs  Lab 04/20/20 0659  AST 32  ALT 27  ALKPHOS 70  BILITOT 1.1  PROT 7.5  ALBUMIN 3.7   Recent Labs  Lab 04/20/20 0659  LIPASE 28   No results for input(s): AMMONIA in the last 168 hours.  ABG No results found for: PHART, PCO2ART, PO2ART, HCO3, TCO2, ACIDBASEDEF, O2SAT   Coagulation Profile: No results for input(s): INR, PROTIME in the last 168 hours.  Cardiac Enzymes: No results for input(s): CKTOTAL, CKMB, CKMBINDEX, TROPONINI in the last 168 hours.  HbA1C: Hgb A1c MFr Bld  Date/Time Value Ref Range Status  02/20/2017 02:43 PM  5.5 4.8 - 5.6 % Final    Comment:             Prediabetes: 5.7 - 6.4          Diabetes: >6.4          Glycemic control for adults with diabetes: <7.0     CBG: Recent Labs  Lab 04/21/20 0337  GLUCAP 140*    This patient is critically ill with multiple organ system failure; which, requires frequent high complexity decision making, assessment, support, evaluation, and titration of therapies. This was completed through the application of advanced monitoring technologies and extensive interpretation of multiple databases. During this encounter critical care time was devoted to patient care services described in this note for 32 minutes.  Josephine Igo, DO [facility] 04/21/2020 7:57 AM

## 2020-04-21 NOTE — Assessment & Plan Note (Addendum)
-  Previous hemoglobin 12 to 13 g/dL.  -Slight hemoglobin drop compared to admission, possibly due to hemodilution from fluids

## 2020-04-21 NOTE — Assessment & Plan Note (Addendum)
-   baseline creatinine ~ 1 - creat peaked at 2.5 - patient presents with increase in creat >0.3 mg/dL above baseline, presumed to have occurred within past 7 days PTA -Etiology considered due to outlet obstruction from obstructed ureteral stone as well as dehydration/prerenal -Holding ARB and HCTZ - s/p fluids; hold off on further for now; continue diet -Okay to remove Foley per urology

## 2020-04-21 NOTE — Assessment & Plan Note (Addendum)
-  Potassium low on admission -Repleted

## 2020-04-21 NOTE — Assessment & Plan Note (Addendum)
-   tacycardia (122), tachypnea (34), lactic acid (4.8>>5.5), source urinary from obstructed L ureteral stone, and also refractory to IVF, started on neo initially then transitioned to Levophed - s/p RIJ placed on 2/4 -Levophed weaned off on 04/21/2020. -Okay to remove right IJ on 04/22/2020 -See pyelonephritis

## 2020-04-21 NOTE — Procedures (Signed)
Central Venous Catheter Insertion Procedure Note  Melissa Cooley  [id number]  12-06-1980  Date:04/21/20  Time:12:14 AM   Provider Performing:Camie Hauss S [name]   Procedure: Insertion of Non-tunneled Central Venous [phone]) with US guidance ([id number])   Indication(s) Medication administration  Consent Risks of the procedure as well as the alternatives and risks of each were explained to the patient and/or caregiver.  Consent for the procedure was obtained and is signed in the bedside chart  Anesthesia Topical only with 1% lidocaine   Timeout Verified patient identification, verified procedure, site/side was marked, verified correct patient position, special equipment/implants available, medications/allergies/relevant history reviewed, required imaging and test results available.  Sterile Technique Maximal sterile technique including full sterile barrier drape, hand hygiene, sterile gown, sterile gloves, mask, hair covering, sterile ultrasound probe cover (if used).  Procedure Description Area of catheter insertion was cleaned with chlorhexidine and draped in sterile fashion.  With real-time ultrasound guidance a central venous catheter was placed into the right internal jugular vein. Nonpulsatile blood flow and easy flushing noted in all ports.  The catheter was sutured in place and sterile dressing applied.  Complications/Tolerance None; patient tolerated the procedure well. Chest X-ray is ordered to verify placement for internal jugular or subclavian cannulation.   Chest x-ray is not ordered for femoral cannulation.  EBL Minimal  Specimen(s) None  [name], MD, PhD 04/21/2020, 12:15 AM [facility] [phone] or if no answer before 7:00PM call [phone] For any issues after 7:00PM please call eLink [phone]

## 2020-04-21 NOTE — Progress Notes (Signed)
CRITICAL VALUE ALERT  Critical Value:  Lactic acid 5.5  Date & Time Notied:  04/20/2020 @ 2245  Provider Notified: Dr. Warrick Parisian aware  Orders Received/Actions taken: Continue treatment, prep for CVC insertion.  Cindy S. Clelia Croft BSN, RN, CCRP 04/21/2020 12:21 AM

## 2020-04-21 NOTE — Progress Notes (Signed)
PROGRESS NOTE    Melissa Cooley   ZOX:[id number]  DOB: Apr 03, 1980  DOA: 04/20/2020     1  PCP: Hoy Register, MD  CC: Left flank pain, dysuria  Hospital Course: Melissa Cooley is a 40 yo female with PMH HTN, obesity who presented to the hospital with left flank pain, vomiting, dysuria.  She also had endorsed change in odor of her urine and dysuria. CT abdomen/pelvis on admission showed a left ureteral 5 mm stone with moderate hydronephrosis and perinephric stranding. She developed progressive hypotension refractory to fluids and required initiation of vasopressors for blood pressure support.  She was evaluated by urology and taken to the OR and underwent left ureteral stent placement.   Interval History:  Patient seen in her room this morning in the ICU.  She is feeling tremendously better she says and was actually contemplating if she can go home; told her she was not quite ready but she was definitely improved compared to yesterday. Levophed was at 3 mcg/min, will turn off at this time and see how blood pressure responds.  She had breakfast in front of her and was about to eat.  No further left flank pain.  Denies any nausea or vomiting.  Old records reviewed in assessment of this patient  ROS: Constitutional: negative for chills and fevers, Respiratory: negative for cough, Cardiovascular: negative for chest pain and Gastrointestinal: negative  Assessment & Plan: * Septic shock (HCC) - tacycardia (122), tachypnea (34), lactic acid (4.8>>5.5), source urinary from obstructed L ureteral stone, and also refractory to IVF, started on neo initially then transitioned to Levophed - s/p RIJ placed on 2/4 - wean levophed; turned off this am (2/5), monitor BP for tolerance of this - okay for diet - follow up cultures - continue rocephin  Acute pyelonephritis -CT abdomen/pelvis on admission showed obstructing left ureteral stone with perinephric stranding -Continue Rocephin -Likely will need  2-week course of antibiotic  AKI (acute kidney injury) (HCC) - baseline creatinine ~ 1 - creat peaked at 2.5 - patient presents with increase in creat >0.3 mg/dL above baseline, presumed to have occurred within past 7 days PTA -Etiology considered due to outlet obstruction from obstructed ureteral stone as well as dehydration/prerenal -Holding ARB and HCTZ - s/p fluids; hold off on further for now; continue diet -Trend BMP   Normocytic anemia -Previous hemoglobin 12 to 13 g/dL.  -Slight hemoglobin drop this morning compared to admission, possibly due to hemodilution from fluids -Trend CBC  Hypokalemia -Potassium low on admission -Replete and recheck as needed -Follow magnesium  Hypertension -Home BP meds on hold in setting of septic shock    Antimicrobials: Rocephin 04/20/2020 >> current  DVT prophylaxis: Lovenox    Code Status:   Code Status: Full Code Family Communication: present in room  Disposition Plan: Status is: Inpatient  Remains inpatient appropriate because:Hemodynamically unstable, IV treatments appropriate due to intensity of illness or inability to take PO and Inpatient level of care appropriate due to severity of illness   Dispo: The patient is from: Home              Anticipated d/c is to: Home              Anticipated d/c date is: 2 days              Patient currently is not medically stable to d/c.   Difficult to place patient No      Risk of unplanned readmission score: Unplanned Admission- Pilot do  not use: 16.37   Objective: Blood pressure 131/65, pulse 89, temperature 97.7 F (36.5 C), temperature source Oral, resp. rate (!) 22, height 5\' 11"  (1.803 m), weight 117 kg, last menstrual period 04/06/2020, SpO2 100 %.  Examination: General appearance: alert, cooperative and no distress Head: Normocephalic, without obvious abnormality, atraumatic  Neck: R IJ noted in placed Eyes: EOMI Lungs: clear to auscultation bilaterally Heart: regular  rate and rhythm and S1, S2 normal Abdomen: normal findings: bowel sounds normal and soft, non-tender Extremities: no edema  Back: no CVA TTP Skin: mobility and turgor normal Neurologic: Grossly normal  Consultants:   PCCM  Urology  Procedures:   Left ureteral stent placement, 04/20/2020  Data Reviewed: I have personally reviewed following labs and imaging studies Results for orders placed or performed during the hospital encounter of 04/20/20 (from the past 24 hour(s))  Lactic acid, plasma     Status: Abnormal   Collection Time: 04/20/20  2:12 PM  Result Value Ref Range   Lactic Acid, Venous 4.8 (HH) 0.5 - 1.9 mmol/L  Culture, blood (routine x 2)     Status: None (Preliminary result)   Collection Time: 04/20/20  2:13 PM   Specimen: BLOOD  Result Value Ref Range   Specimen Description      BLOOD RIGHT ANTECUBITAL Performed at [facility], [address], [place], [place]    Special Requests      BOTTLES DRAWN AEROBIC AND ANAEROBIC Blood Culture adequate volume Performed at [facility], [address], [place], [place]    Culture      NO GROWTH < 24 HOURS Performed at [facility], [address] [place], [place]    Report Status PENDING   Culture, blood (routine x 2)     Status: None (Preliminary result)   Collection Time: 04/20/20  2:13 PM   Specimen: BLOOD  Result Value Ref Range   Specimen Description      BLOOD LEFT ANTECUBITAL Performed at [facility], [address] [place], [place]    Special Requests      BOTTLES DRAWN AEROBIC AND ANAEROBIC Blood Culture adequate volume Performed at [facility], [address] [place], [place]    Culture      NO GROWTH < 24 HOURS Performed at [facility], [address] [place], [place]    Report Status PENDING   Magnesium     Status: Abnormal   Collection Time:  04/20/20  2:19 PM  Result Value Ref Range   Magnesium 1.3 (L) 1.7 - 2.4 mg/dL  Basic metabolic panel     Status: Abnormal   Collection Time: 04/20/20  7:07 PM  Result Value Ref Range   Sodium 138 135 - 145 mmol/L   Potassium 3.0 (L) 3.5 - 5.1 mmol/L   Chloride 107 98 - 111 mmol/L   CO2 16 (L) 22 - 32 mmol/L   Glucose, Bld 115 (H) 70 - 99 mg/dL   BUN 23 (H) 6 - 20 mg/dL   Creatinine, Ser 06/18/20 (H) 0.44 - 1.00 mg/dL   Calcium 7.2 (L) 8.9 - 10.3 mg/dL   GFR, Estimated 24 (L) >60 mL/min   Anion gap 15 5 - 15  Lactic acid, plasma     Status: Abnormal   Collection Time: 04/20/20  9:43 PM  Result Value Ref Range   Lactic Acid, Venous 5.5 (HH) 0.5 - 1.9  mmol/L  Glucose, capillary     Status: Abnormal   Collection Time: 04/21/20  3:37 AM  Result Value Ref Range   Glucose-Capillary 140 (H) 70 - 99 mg/dL  Basic metabolic panel     Status: Abnormal   Collection Time: 04/21/20  5:30 AM  Result Value Ref Range   Sodium 138 135 - 145 mmol/L   Potassium 4.1 3.5 - 5.1 mmol/L   Chloride 109 98 - 111 mmol/L   CO2 17 (L) 22 - 32 mmol/L   Glucose, Bld 138 (H) 70 - 99 mg/dL   BUN 29 (H) 6 - 20 mg/dL   Creatinine, Ser 9.62 (H) 0.44 - 1.00 mg/dL   Calcium 7.5 (L) 8.9 - 10.3 mg/dL   GFR, Estimated 36 (L) >60 mL/min   Anion gap 12 5 - 15  CBC     Status: Abnormal   Collection Time: 04/21/20  5:30 AM  Result Value Ref Range   WBC 17.4 (H) 4.0 - 10.5 K/uL   RBC 3.36 (L) 3.87 - 5.11 MIL/uL   Hemoglobin 10.5 (L) 12.0 - 15.0 g/dL   HCT 22.9 (L) 79.8 - 92.1 %   MCV 92.9 80.0 - 100.0 fL   MCH 31.3 26.0 - 34.0 pg   MCHC 33.7 30.0 - 36.0 g/dL   RDW 19.4 17.4 - 08.1 %   Platelets 141 (L) 150 - 400 K/uL   nRBC 0.0 0.0 - 0.2 %  Magnesium     Status: None   Collection Time: 04/21/20  5:30 AM  Result Value Ref Range   Magnesium 1.9 1.7 - 2.4 mg/dL  MRSA PCR Screening     Status: None   Collection Time: 04/21/20  8:11 AM   Specimen: Nasal Mucosa; Nasopharyngeal  Result Value Ref Range   MRSA by PCR  NEGATIVE NEGATIVE    Recent Results (from the past 240 hour(s))  SARS CORONAVIRUS 2 (TAT 6-24 HRS) Nasopharyngeal Nasopharyngeal Swab     Status: None   Collection Time: 04/19/20  9:12 PM   Specimen: Nasopharyngeal Swab  Result Value Ref Range Status   SARS Coronavirus 2 NEGATIVE NEGATIVE Final    Comment: (NOTE) SARS-CoV-2 target nucleic acids are NOT DETECTED.  The SARS-CoV-2 RNA is generally detectable in upper and lower respiratory specimens during the acute phase of infection. Negative results do not preclude SARS-CoV-2 infection, do not rule out co-infections with other pathogens, and should not be used as the sole basis for treatment or other patient management decisions. Negative results must be combined with clinical observations, patient history, and epidemiological information. The expected result is Negative.  Fact Sheet for Patients: HairSlick.no  Fact Sheet for Healthcare Providers: quierodirigir.com  This test is not yet approved or cleared by the [place] FDA and  has been authorized for detection and/or diagnosis of SARS-CoV-2 by FDA under an Emergency Use Authorization (EUA). This EUA will remain  in effect (meaning this test can be used) for the duration of the COVID-19 declaration under Se ction 564(b)(1) of the Act, 21 U.S.C. section 360bbb-3(b)(1), unless the authorization is terminated or revoked sooner.  Performed at [facility], [address] [place], [place] [address]   Culture, blood (routine x 2)     Status: None (Preliminary result)   Collection Time: 04/20/20  2:13 PM   Specimen: BLOOD  Result Value Ref Range Status   Specimen Description   Final    BLOOD RIGHT ANTECUBITAL Performed at [facility], [address] [place], [place] [address]    Special Requests   Final    BOTTLES DRAWN AEROBIC AND ANAEROBIC Blood Culture adequate volume Performed at [facility], [address] [place], [place] [address]    Culture   Final    NO GROWTH < 24 HOURS Performed at [facility], [address] [place], [place] [address]    Report Status PENDING  Incomplete  Culture, blood (routine x 2)     Status: None (Preliminary result)   Collection Time: 04/20/20  2:13 PM   Specimen: BLOOD  Result Value Ref Range Status   Specimen Description   Final    BLOOD LEFT ANTECUBITAL Performed at [facility], [address] [place], [place] [address]    Special Requests   Final    BOTTLES DRAWN AEROBIC AND ANAEROBIC Blood Culture adequate volume Performed at [facility], [address] [place], [place] [address]    Culture   Final    NO GROWTH < 24 HOURS Performed at [facility], [address] [place], [place] [address]    Report Status PENDING  Incomplete  MRSA PCR Screening     Status: None   Collection Time: 04/21/20  8:11 AM   Specimen: Nasal Mucosa; Nasopharyngeal  Result Value Ref Range Status   MRSA by PCR NEGATIVE NEGATIVE Final    Comment:        The GeneXpert MRSA Assay (FDA approved for NASAL specimens only), is one component of a comprehensive MRSA colonization surveillance program. It is not intended to diagnose MRSA infection nor to guide or monitor treatment for MRSA infections. Performed at [facility], [address] [place], [place] [address]      Radiology Studies: CT ABDOMEN PELVIS W CONTRAST  Result Date: 04/20/2020 CLINICAL DATA:  Left flank pain. Kidney stone suspected. Suspected pyelonephritis. Vomiting and fever. EXAM: CT ABDOMEN AND PELVIS WITH CONTRAST TECHNIQUE: Multidetector CT imaging of the abdomen and pelvis was performed using the standard protocol following bolus administration of intravenous contrast. CONTRAST:  100mL OMNIPAQUE IOHEXOL 300 MG/ML  SOLN COMPARISON:  None. FINDINGS: Lower chest: Mild atelectasis in the  left base. No other acute abnormalities in the lower chest. Hepatobiliary: No focal liver abnormality is seen. Status post cholecystectomy. No biliary dilatation. Pancreas: Unremarkable. No pancreatic ductal dilatation or surrounding inflammatory changes. Spleen: Normal in size without focal abnormality. Adrenals/Urinary Tract: There is a 1 or 2 mm stone in the right kidney seen on coronal image 94. A probable punctate stone is seen in the right kidney on coronal image 81. No masses, hydronephrosis, or perinephric stranding on the right. The right ureter is normal. The left kidney is enlarged compared to the right with less enhancement of the left renal cortex compared to the right. No striated nephrogram identified. There is a 2 mm stone in the left kidney seen on coronal image 70. No other stones are seen in the left kidney. There is moderate hydronephrosis and perinephric stranding on the left. The proximal left ureter is dilated. There is a 5 mm stone in the proximal left ureter seen on coronal image 75. The remainder of the left ureter is unremarkable and on dilated with no additional stones identified. The bladder is normal. Stomach/Bowel: Stomach is within normal limits. Appendix appears normal. No evidence of bowel wall thickening, distention, or inflammatory changes. Vascular/Lymphatic: No significant vascular findings are present. No enlarged abdominal or pelvic lymph nodes.  Reproductive: Uterus and bilateral adnexa are unremarkable. Other: No abdominal wall hernia or abnormality. No abdominopelvic ascites. Musculoskeletal: No acute or significant osseous findings. IMPRESSION: 1. There is a 5 mm stone in the proximal left ureter resulting in ureterectasis, moderate hydronephrosis, and moderate perinephric stranding. There is also decreased enhancement in the left renal cortex versus the right suggesting significant obstruction. No abscess. No striated nephrogram to confirm the clinical suspicion of  pyelonephritis. 2. Small stones in both kidneys as above. Electronically Signed   By: Gerome Sam III M.D   On: 04/20/2020 09:42   DG CHEST PORT 1 VIEW  Result Date: 04/21/2020 CLINICAL DATA:  Central line placement EXAM: PORTABLE CHEST 1 VIEW COMPARISON:  03/14/2020 FINDINGS: Right central line tip is in the SVC. No pneumothorax. Mild vascular congestion and bibasilar atelectasis. No effusions or acute bony abnormality. IMPRESSION: Right central line tip in the SVC.  No pneumothorax. Vascular congestion and bibasilar atelectasis. Electronically Signed   By: Charlett Nose M.D.   On: 04/21/2020 00:32   DG C-Arm 1-60 Min-No Report  Result Date: 04/20/2020 Fluoroscopy was utilized by the requesting physician.  No radiographic interpretation.   DG CHEST PORT 1 VIEW  Final Result    DG C-Arm 1-60 Min-No Report  Final Result    CT ABDOMEN PELVIS W CONTRAST  Final Result      Scheduled Meds: . acetaminophen  1,000 mg Oral Once  . Chlorhexidine Gluconate Cloth  6 each Topical Daily  . enoxaparin (LOVENOX) injection  60 mg Subcutaneous Q24H  . sodium chloride flush  10-40 mL Intracatheter Q12H   PRN Meds: acetaminophen **OR** acetaminophen, labetalol, melatonin, ondansetron **OR** ondansetron (ZOFRAN) IV, oxyCODONE, sodium chloride flush Continuous Infusions: . sodium chloride    . cefTRIAXone (ROCEPHIN)  IV Stopped (04/21/20 0835)  . norepinephrine (LEVOPHED) Adult infusion Stopped (04/21/20 0842)     LOS: 1 day  Time spent: Greater than 50% of the 35 minute visit was spent in counseling/coordination of care for the patient as laid out in the A&P.   Lewie Chamber, MD [facility] 04/21/2020, 11:47 AM

## 2020-04-21 NOTE — Progress Notes (Addendum)
Urology Progress Note  Physician requesting consult: Joen Laura, MD  Reason for consult: Infected obstructing ureteral stone  Subjective: Remains on phenylephrine this morning, feels a little short of breath. Flank pain significantly improved.  Urine output 425cc recorded overnight Lactate 5.5    Physical Exam:  Vital signs in last 24 hours: Temp:  [97.7 F (36.5 C)-99 F (37.2 C)] 97.7 F (36.5 C) (02/05 0436) Pulse Rate:  [42-148] 99 (02/05 0700) Resp:  [16-36] 25 (02/05 0700) BP: (62-150)/(19-115) 100/75 (02/05 0700) SpO2:  [85 %-100 %] 99 % (02/05 0700) Weight:  [117 kg-120.2 kg] 117 kg (02/04 2035) Constitutional:  Alert and oriented, No acute distress Cardiovascular: Regular rate and rhythm Respiratory: Normal respiratory effort GI: Abdomen is soft, nontender, nondistended GU: Foley in place, urine clear pink Lymphatic: No lymphadenopathy Neurologic: Grossly intact, no focal deficits Psychiatric: Normal mood and affect  Laboratory Data:  Recent Labs    04/20/20 0659 04/21/20 0530  WBC 16.9* 17.4*  HGB 13.9 10.5*  HCT 40.0 31.2*  PLT 301 141*    Recent Labs    04/20/20 0659 04/20/20 1907 04/21/20 0530  NA 134* 138 138  K 3.1* 3.0* 4.1  CL 104 107 109  GLUCOSE 137* 115* 138*  BUN 15 23* 29*  CALCIUM 9.0 7.2* 7.5*  CREATININE 1.45* 2.50* 1.80*     Results for orders placed or performed during the hospital encounter of 04/20/20 (from the past 24 hour(s))  Lactic acid, plasma     Status: Abnormal   Collection Time: 04/20/20  2:12 PM  Result Value Ref Range   Lactic Acid, Venous 4.8 (HH) 0.5 - 1.9 mmol/L  Magnesium     Status: Abnormal   Collection Time: 04/20/20  2:19 PM  Result Value Ref Range   Magnesium 1.3 (L) 1.7 - 2.4 mg/dL  Basic metabolic panel     Status: Abnormal   Collection Time: 04/20/20  7:07 PM  Result Value Ref Range   Sodium 138 135 - 145 mmol/L   Potassium 3.0 (L) 3.5 - 5.1 mmol/L   Chloride 107 98 - 111 mmol/L    CO2 16 (L) 22 - 32 mmol/L   Glucose, Bld 115 (H) 70 - 99 mg/dL   BUN 23 (H) 6 - 20 mg/dL   Creatinine, Ser 3.36 (H) 0.44 - 1.00 mg/dL   Calcium 7.2 (L) 8.9 - 10.3 mg/dL   GFR, Estimated 24 (L) >60 mL/min   Anion gap 15 5 - 15  Lactic acid, plasma     Status: Abnormal   Collection Time: 04/20/20  9:43 PM  Result Value Ref Range   Lactic Acid, Venous 5.5 (HH) 0.5 - 1.9 mmol/L  Glucose, capillary     Status: Abnormal   Collection Time: 04/21/20  3:37 AM  Result Value Ref Range   Glucose-Capillary 140 (H) 70 - 99 mg/dL  Basic metabolic panel     Status: Abnormal   Collection Time: 04/21/20  5:30 AM  Result Value Ref Range   Sodium 138 135 - 145 mmol/L   Potassium 4.1 3.5 - 5.1 mmol/L   Chloride 109 98 - 111 mmol/L   CO2 17 (L) 22 - 32 mmol/L   Glucose, Bld 138 (H) 70 - 99 mg/dL   BUN 29 (H) 6 - 20 mg/dL   Creatinine, Ser 1.22 (H) 0.44 - 1.00 mg/dL   Calcium 7.5 (L) 8.9 - 10.3 mg/dL   GFR, Estimated 36 (L) >60 mL/min   Anion gap 12 5 - 15  CBC     Status: Abnormal   Collection Time: 04/21/20  5:30 AM  Result Value Ref Range   WBC 17.4 (H) 4.0 - 10.5 K/uL   RBC 3.36 (L) 3.87 - 5.11 MIL/uL   Hemoglobin 10.5 (L) 12.0 - 15.0 g/dL   HCT 62.3 (L) 76.2 - 83.1 %   MCV 92.9 80.0 - 100.0 fL   MCH 31.3 26.0 - 34.0 pg   MCHC 33.7 30.0 - 36.0 g/dL   RDW 51.7 61.6 - 07.3 %   Platelets 141 (L) 150 - 400 K/uL   nRBC 0.0 0.0 - 0.2 %  Magnesium     Status: None   Collection Time: 04/21/20  5:30 AM  Result Value Ref Range   Magnesium 1.9 1.7 - 2.4 mg/dL   Recent Results (from the past 240 hour(s))  SARS CORONAVIRUS 2 (TAT 6-24 HRS) Nasopharyngeal Nasopharyngeal Swab     Status: None   Collection Time: 04/19/20  9:12 PM   Specimen: Nasopharyngeal Swab  Result Value Ref Range Status   SARS Coronavirus 2 NEGATIVE NEGATIVE Final    Comment: (NOTE) SARS-CoV-2 target nucleic acids are NOT DETECTED.  The SARS-CoV-2 RNA is generally detectable in upper and lower respiratory specimens during  the acute phase of infection. Negative results do not preclude SARS-CoV-2 infection, do not rule out co-infections with other pathogens, and should not be used as the sole basis for treatment or other patient management decisions. Negative results must be combined with clinical observations, patient history, and epidemiological information. The expected result is Negative.  Fact Sheet for Patients: HairSlick.no  Fact Sheet for Healthcare Providers: quierodirigir.com  This test is not yet approved or cleared by the [place] FDA and  has been authorized for detection and/or diagnosis of SARS-CoV-2 by FDA under an Emergency Use Authorization (EUA). This EUA will remain  in effect (meaning this test can be used) for the duration of the COVID-19 declaration under Se ction 564(b)(1) of the Act, 21 U.S.C. section 360bbb-3(b)(1), unless the authorization is terminated or revoked sooner.  Performed at [facility], [address] [place], [place] [address]     Renal Function: Recent Labs    04/20/20 6948 04/20/20 1907 04/21/20 0530  CREATININE 1.45* 2.50* 1.80*   Estimated Creatinine Clearance: 59.2 mL/min (A) (by C-G formula based on SCr of 1.8 mg/dL (H)).  Radiologic Imaging: CT ABDOMEN PELVIS W CONTRAST  Result Date: 04/20/2020 CLINICAL DATA:  Left flank pain. Kidney stone suspected. Suspected pyelonephritis. Vomiting and fever. EXAM: CT ABDOMEN AND PELVIS WITH CONTRAST TECHNIQUE: Multidetector CT imaging of the abdomen and pelvis was performed using the standard protocol following bolus administration of intravenous contrast. CONTRAST:  OMNIPAQUE IOHEXOL 300 MG/ML  SOLN COMPARISON:  None. FINDINGS: Lower chest: Mild atelectasis in the left base. No other acute abnormalities in the lower chest. Hepatobiliary: No focal liver abnormality is seen. Status post cholecystectomy. No biliary dilatation. Pancreas:  Unremarkable. No pancreatic ductal dilatation or surrounding inflammatory changes. Spleen: Normal in size without focal abnormality. Adrenals/Urinary Tract: There is a 1 or 2 mm stone in the right kidney seen on coronal image 94. A probable punctate stone is seen in the right kidney on coronal image 81. No masses, hydronephrosis, or perinephric stranding on the right. The right ureter is normal. The left kidney is enlarged compared to the right with less enhancement of the left renal cortex compared to the right. No striated nephrogram identified. There is a 2 mm stone in the  left kidney seen on coronal image 70. No other stones are seen in the left kidney. There is moderate hydronephrosis and perinephric stranding on the left. The proximal left ureter is dilated. There is a 5 mm stone in the proximal left ureter seen on coronal image 75. The remainder of the left ureter is unremarkable and on dilated with no additional stones identified. The bladder is normal. Stomach/Bowel: Stomach is within normal limits. Appendix appears normal. No evidence of bowel wall thickening, distention, or inflammatory changes. Vascular/Lymphatic: No significant vascular findings are present. No enlarged abdominal or pelvic lymph nodes. Reproductive: Uterus and bilateral adnexa are unremarkable. Other: No abdominal wall hernia or abnormality. No abdominopelvic ascites. Musculoskeletal: No acute or significant osseous findings. IMPRESSION: 1. There is a 5 mm stone in the proximal left ureter resulting in ureterectasis, moderate hydronephrosis, and moderate perinephric stranding. There is also decreased enhancement in the left renal cortex versus the right suggesting significant obstruction. No abscess. No striated nephrogram to confirm the clinical suspicion of pyelonephritis. 2. Small stones in both kidneys as above. Electronically Signed   By: Gerome Sam III M.D   On: 04/20/2020 09:42   DG CHEST PORT 1 VIEW  Result Date:  04/21/2020 CLINICAL DATA:  Central line placement EXAM: PORTABLE CHEST 1 VIEW COMPARISON:  03/14/2020 FINDINGS: Right central line tip is in the SVC. No pneumothorax. Mild vascular congestion and bibasilar atelectasis. No effusions or acute bony abnormality. IMPRESSION: Right central line tip in the SVC.  No pneumothorax. Vascular congestion and bibasilar atelectasis. Electronically Signed   By: Charlett Nose M.D.   On: 04/21/2020 00:32   DG C-Arm 1-60 Min-No Report  Result Date: 04/20/2020 Fluoroscopy was utilized by the requesting physician.  No radiographic interpretation.    I independently reviewed the above imaging studies.  Impression/Recommendation: Left ureteral calculus with urosepsis s/p left ureteral stent placement.   - Would continue foley catheter for maximal decompression until patient weaned off pressors - Urine/blood Cx/Abx/ await C/s results - Will schedule patient for definitive outpatient stone treatment  Thea Alken 04/21/2020, 7:14 AM  I performed a history and physical examination of the patient and discussed the patients management with the resident.  I reviewed the resident's note and agree with the documented findings and plan of care.

## 2020-04-21 NOTE — Assessment & Plan Note (Addendum)
-  CT abdomen/pelvis on admission showed obstructing left ureteral stone with perinephric stranding -Likely will need 2-week course of antibiotic; urology also recommending 14-day course; Omnicef given at discharge to complete course -Urine culture growing E. Coli; continue on Rocephin while hospitalized and will likely de-escalate to Omnicef at discharge to complete course

## 2020-04-21 NOTE — Hospital Course (Addendum)
Melissa Cooley is a 40 yo female with PMH HTN, obesity who presented to the hospital with left flank pain, vomiting, dysuria.  She also had endorsed change in odor of her urine and dysuria. CT abdomen/pelvis on admission showed a left ureteral 5 mm stone with moderate hydronephrosis and perinephric stranding. She developed progressive hypotension refractory to fluids and required initiation of vasopressors for blood pressure support.  She was evaluated by urology and taken to the OR and underwent left ureteral stent placement. She responded well clinically and had good improvement in renal function and return of urine output.  She was weaned quickly off of vasopressors. Urine culture grew E. coli.  She was transitioned to [facility] at discharge to complete a total of 2-week course.  She will follow-up with urology outpatient for definitive stone management and stent removal.

## 2020-04-21 NOTE — Assessment & Plan Note (Addendum)
-  Blood pressure has recovered -Still holding ARB and HCTZ -Start on amlodipine; continued at discharge

## 2020-04-22 ENCOUNTER — Inpatient Hospital Stay (HOSPITAL_COMMUNITY): Payer: Medicaid Other

## 2020-04-22 LAB — CBC WITH DIFFERENTIAL/PLATELET
Abs Immature Granulocytes: 0.16 10*3/uL — ABNORMAL HIGH (ref 0.00–0.07)
Basophils Absolute: 0.1 10*3/uL (ref 0.0–0.1)
Basophils Relative: 1 %
Eosinophils Absolute: 0 10*3/uL (ref 0.0–0.5)
Eosinophils Relative: 0 %
HCT: 30.8 % — ABNORMAL LOW (ref 36.0–46.0)
Hemoglobin: 10.5 g/dL — ABNORMAL LOW (ref 12.0–15.0)
Immature Granulocytes: 1 %
Lymphocytes Relative: 7 %
Lymphs Abs: 1.3 10*3/uL (ref 0.7–4.0)
MCH: 31.3 pg (ref 26.0–34.0)
MCHC: 34.1 g/dL (ref 30.0–36.0)
MCV: 91.9 fL (ref 80.0–100.0)
Monocytes Absolute: 0.6 10*3/uL (ref 0.1–1.0)
Monocytes Relative: 3 %
Neutro Abs: 17.2 10*3/uL — ABNORMAL HIGH (ref 1.7–7.7)
Neutrophils Relative %: 88 %
Platelets: 117 10*3/uL — ABNORMAL LOW (ref 150–400)
RBC: 3.35 MIL/uL — ABNORMAL LOW (ref 3.87–5.11)
RDW: 14.8 % (ref 11.5–15.5)
WBC: 19.5 10*3/uL — ABNORMAL HIGH (ref 4.0–10.5)
nRBC: 0 % (ref 0.0–0.2)

## 2020-04-22 LAB — BASIC METABOLIC PANEL
Anion gap: 13 (ref 5–15)
BUN: 29 mg/dL — ABNORMAL HIGH (ref 6–20)
CO2: 18 mmol/L — ABNORMAL LOW (ref 22–32)
Calcium: 8 mg/dL — ABNORMAL LOW (ref 8.9–10.3)
Chloride: 110 mmol/L (ref 98–111)
Creatinine, Ser: 1.21 mg/dL — ABNORMAL HIGH (ref 0.44–1.00)
GFR, Estimated: 58 mL/min — ABNORMAL LOW (ref >60)
Glucose, Bld: 102 mg/dL — ABNORMAL HIGH (ref 70–99)
Potassium: 3.5 mmol/L (ref 3.5–5.1)
Sodium: 141 mmol/L (ref 135–145)

## 2020-04-22 LAB — URINE CULTURE: Culture: >=100000 — AB

## 2020-04-22 LAB — MAGNESIUM: Magnesium: 2.8 mg/dL — ABNORMAL HIGH (ref 1.7–2.4)

## 2020-04-22 MED ORDER — AMLODIPINE BESYLATE 10 MG PO TABS
10.0000 mg | ORAL_TABLET | Freq: Every day | ORAL | Status: DC
  Administered 2020-04-23: 10 mg via ORAL
  Filled 2020-04-22 (×2): qty 1

## 2020-04-22 MED ORDER — HYDRALAZINE HCL 25 MG PO TABS: 25.0000 mg | ORAL_TABLET | ORAL | Status: DC | PRN

## 2020-04-22 MED ORDER — LABETALOL HCL 5 MG/ML IV SOLN
10.0000 mg | INTRAVENOUS | Status: DC | PRN
  Filled 2020-04-22: qty 4

## 2020-04-22 NOTE — Progress Notes (Signed)
PROGRESS NOTE    Melissa Cooley   ZYS:[id number]  DOB: [birth year]  DOA: 04/20/2020     2  PCP: Hoy Register, MD  CC: Left flank pain, dysuria  Hospital Course: Melissa Cooley is a 40 yo female with PMH HTN, obesity who presented to the hospital with left flank pain, vomiting, dysuria.  She also had endorsed change in odor of her urine and dysuria. CT abdomen/pelvis on admission showed a left ureteral 5 mm stone with moderate hydronephrosis and perinephric stranding. She developed progressive hypotension refractory to fluids and required initiation of vasopressors for blood pressure support.  She was evaluated by urology and taken to the OR and underwent left ureteral stent placement. She responded well clinically and had good improvement in renal function and return of urine output.  She was weaned quickly off of vasopressors. Urine culture grew E. coli.   Interval History:  No events overnight.  Feeling tremendously better and is voiding well.  She is hopeful for getting the catheter removed and discharging soon. Informed her of updated labs and stated possibly could go home tomorrow. Stable for transferring out of the ICU.  Transfer orders placed.  Old records reviewed in assessment of this patient  ROS: Constitutional: negative for chills and fevers, Respiratory: negative for cough, Cardiovascular: negative for chest pain and Gastrointestinal: negative  Assessment & Plan: * Septic shock (HCC)-resolved as of 04/22/2020 - tacycardia (122), tachypnea (34), lactic acid (4.8>>5.5), source urinary from obstructed L ureteral stone, and also refractory to IVF, started on neo initially then transitioned to Levophed - s/p RIJ placed on 2/4 -Levophed weaned off on 04/21/2020. -Okay to remove right IJ on 04/22/2020 -See pyelonephritis  Acute pyelonephritis -CT abdomen/pelvis on admission showed obstructing left ureteral stone with perinephric stranding -Continue Rocephin -Likely will need 2-week  course of antibiotic; urology also recommending 14-day course -Urine culture growing E. Coli; continue on Rocephin while hospitalized and will likely de-escalate to Omnicef at discharge to complete course  AKI (acute kidney injury) (HCC) - baseline creatinine ~ 1 - creat peaked at 2.5 - patient presents with increase in creat >0.3 mg/dL above baseline, presumed to have occurred within past 7 days PTA -Etiology considered due to outlet obstruction from obstructed ureteral stone as well as dehydration/prerenal -Holding ARB and HCTZ - s/p fluids; hold off on further for now; continue diet -Trend BMP -Okay to remove Foley per urology   Normocytic anemia -Previous hemoglobin 12 to 13 g/dL.  -Slight hemoglobin drop compared to admission, possibly due to hemodilution from fluids -Trend CBC  Hypokalemia -Potassium low on admission -Replete and recheck as needed -Follow magnesium  Hypertension -Blood pressure has recovered -Still holding ARB and HCTZ -Start on amlodipine -Continue labetalol or hydralazine as needed   Antimicrobials: Rocephin 04/20/2020 >> current  DVT prophylaxis: Lovenox    Code Status:   Code Status: Full Code Family Communication: present in room  Disposition Plan: Status is: Inpatient  Remains inpatient appropriate because:Hemodynamically unstable, IV treatments appropriate due to intensity of illness or inability to take PO and Inpatient level of care appropriate due to severity of illness   Dispo: The patient is from: Home              Anticipated d/c is to: Home              Anticipated d/c date is: 1 day              Patient currently is not medically stable to d/c.  Difficult to place patient No  Risk of unplanned readmission score: Unplanned Admission- Pilot do not use: 22.78   Objective: Blood pressure (!) 142/65, pulse 88, temperature 98.2 F (36.8 C), temperature source Oral, resp. rate (!) 25, height 5\' 11"  (1.803 m), weight 117 kg, last  menstrual period 04/06/2020, SpO2 95 %.  Examination: General appearance: alert, cooperative and no distress Head: Normocephalic, without obvious abnormality, atraumatic  Neck: R IJ noted in placed Eyes: EOMI Lungs: clear to auscultation bilaterally Heart: regular rate and rhythm and S1, S2 normal Abdomen: normal findings: bowel sounds normal and soft, non-tender Extremities: no edema  Back: no CVA TTP Skin: mobility and turgor normal Neurologic: Grossly normal  Consultants:   PCCM  Urology  Procedures:   Left ureteral stent placement, 04/20/2020  Data Reviewed: I have personally reviewed following labs and imaging studies Results for orders placed or performed during the hospital encounter of 04/20/20 (from the past 24 hour(s))  Basic metabolic panel     Status: Abnormal   Collection Time: 04/22/20  3:28 AM  Result Value Ref Range   Sodium 141 135 - 145 mmol/L   Potassium 3.5 3.5 - 5.1 mmol/L   Chloride 110 98 - 111 mmol/L   CO2 18 (L) 22 - 32 mmol/L   Glucose, Bld 102 (H) 70 - 99 mg/dL   BUN 29 (H) 6 - 20 mg/dL   Creatinine, Ser 8.111.21 (H) 0.44 - 1.00 mg/dL   Calcium 8.0 (L) 8.9 - 10.3 mg/dL   GFR, Estimated 58 (L) >60 mL/min   Anion gap 13 5 - 15  CBC with Differential/Platelet     Status: Abnormal   Collection Time: 04/22/20  3:28 AM  Result Value Ref Range   WBC 19.5 (H) 4.0 - 10.5 K/uL   RBC 3.35 (L) 3.87 - 5.11 MIL/uL   Hemoglobin 10.5 (L) 12.0 - 15.0 g/dL   HCT 91.430.8 (L) 78.236.0 - 95.646.0 %   MCV 91.9 80.0 - 100.0 fL   MCH 31.3 26.0 - 34.0 pg   MCHC 34.1 30.0 - 36.0 g/dL   RDW 21.314.8 08.611.5 - 57.815.5 %   Platelets 117 (L) 150 - 400 K/uL   nRBC 0.0 0.0 - 0.2 %   Neutrophils Relative % 88 %   Neutro Abs 17.2 (H) 1.7 - 7.7 K/uL   Lymphocytes Relative 7 %   Lymphs Abs 1.3 0.7 - 4.0 K/uL   Monocytes Relative 3 %   Monocytes Absolute 0.6 0.1 - 1.0 K/uL   Eosinophils Relative 0 %   Eosinophils Absolute 0.0 0.0 - 0.5 K/uL   Basophils Relative 1 %   Basophils Absolute 0.1  0.0 - 0.1 K/uL   WBC Morphology MILD LEFT SHIFT (1-5% METAS, OCC MYELO, OCC BANDS)    Immature Granulocytes 1 %   Abs Immature Granulocytes 0.16 (H) 0.00 - 0.07 K/uL   Reactive, Benign Lymphocytes PRESENT   Magnesium     Status: Abnormal   Collection Time: 04/22/20  3:28 AM  Result Value Ref Range   Magnesium 2.8 (H) 1.7 - 2.4 mg/dL    Recent Results (from the past 240 hour(s))  SARS CORONAVIRUS 2 (TAT 6-24 HRS) Nasopharyngeal Nasopharyngeal Swab     Status: None   Collection Time: 04/19/20  9:12 PM   Specimen: Nasopharyngeal Swab  Result Value Ref Range Status   SARS Coronavirus 2 NEGATIVE NEGATIVE Final    Comment: (NOTE) SARS-CoV-2 target nucleic acids are NOT DETECTED.  The SARS-CoV-2 RNA is generally detectable in  upper and lower respiratory specimens during the acute phase of infection. Negative results do not preclude SARS-CoV-2 infection, do not rule out co-infections with other pathogens, and should not be used as the sole basis for treatment or other patient management decisions. Negative results must be combined with clinical observations, patient history, and epidemiological information. The expected result is Negative.  Fact Sheet for Patients: HairSlick.no  Fact Sheet for Healthcare Providers: quierodirigir.com  This test is not yet approved or cleared by the [place] FDA and  has been authorized for detection and/or diagnosis of SARS-CoV-2 by FDA under an Emergency Use Authorization (EUA). This EUA will remain  in effect (meaning this test can be used) for the duration of the COVID-19 declaration under Se ction 564(b)(1) of the Act, 21 U.S.C. section 360bbb-3(b)(1), unless the authorization is terminated or revoked sooner.  Performed at [facility], [address] [place], [place] [address]   Urine culture     Status: Abnormal   Collection Time: 04/20/20  6:54 AM   Specimen: Urine, Random   Result Value Ref Range Status   Specimen Description URINE, RANDOM  Final   Special Requests   Final    NONE Performed at [facility], [address] [place], [place] [address]    Culture >=100,000 COLONIES/mL ESCHERICHIA COLI (A)  Final   Report Status 04/22/2020 FINAL  Final   Organism ID, Bacteria ESCHERICHIA COLI (A)  Final      Susceptibility   Escherichia coli - MIC*    AMPICILLIN 8 SENSITIVE Sensitive     CEFAZOLIN <=4 SENSITIVE Sensitive     CEFEPIME <=0.12 SENSITIVE Sensitive     CEFTRIAXONE <=0.25 SENSITIVE Sensitive     CIPROFLOXACIN 1 SENSITIVE Sensitive     GENTAMICIN <=1 SENSITIVE Sensitive     IMIPENEM <=0.25 SENSITIVE Sensitive     NITROFURANTOIN 128 RESISTANT Resistant     TRIMETH/SULFA <=20 SENSITIVE Sensitive     AMPICILLIN/SULBACTAM 4 SENSITIVE Sensitive     PIP/TAZO <=4 SENSITIVE Sensitive     * >=100,000 COLONIES/mL ESCHERICHIA COLI  Culture, blood (routine x 2)     Status: None (Preliminary result)   Collection Time: 04/20/20  2:13 PM   Specimen: BLOOD  Result Value Ref Range Status   Specimen Description   Final    BLOOD RIGHT ANTECUBITAL Performed at [facility], [address] [place], [place] [address]    Special Requests   Final    BOTTLES DRAWN AEROBIC AND ANAEROBIC Blood Culture adequate volume Performed at [facility], [address] [place], [place] [address]    Culture   Final    NO GROWTH 2 DAYS Performed at [facility], [address], [place], [place] [address]    Report Status PENDING  Incomplete  Culture, blood (routine x 2)     Status: None (Preliminary result)   Collection Time: 04/20/20  2:13 PM   Specimen: BLOOD  Result Value Ref Range Status   Specimen Description   Final    BLOOD LEFT ANTECUBITAL Performed at [facility], [address], [place], [place] [address]    Special Requests   Final    BOTTLES DRAWN AEROBIC AND ANAEROBIC Blood Culture  adequate volume Performed at [facility], [address], [place], [place] [address]    Culture   Final    NO GROWTH 2 DAYS Performed at [facility], [address]  [place], [place] [address]    Report Status PENDING  Incomplete  Urine Culture     Status: Abnormal   Collection Time: 04/20/20  4:36 PM   Specimen: PATH Other; GU  Result Value Ref Range Status   Specimen Description   Final    URINE, RANDOM  LEFT RENAL PELVIS Performed at [facility], [address] [place], [place] [address]    Special Requests   Final    NONE Performed at [facility], [address] [place], [place] [address]    Culture MULTIPLE SPECIES PRESENT, SUGGEST RECOLLECTION (A)  Final   Report Status 04/22/2020 FINAL  Final  MRSA PCR Screening     Status: None   Collection Time: 04/21/20  8:11 AM   Specimen: Nasal Mucosa; Nasopharyngeal  Result Value Ref Range Status   MRSA by PCR NEGATIVE NEGATIVE Final    Comment:        The GeneXpert MRSA Assay (FDA approved for NASAL specimens only), is one component of a comprehensive MRSA colonization surveillance program. It is not intended to diagnose MRSA infection nor to guide or monitor treatment for MRSA infections. Performed at [facility], [address] [place], [place] [address]      Radiology Studies: DG Abd 1 View  Result Date: 04/22/2020 CLINICAL DATA:  40 year old female with left side ureteral calculus and obstruction. EXAM: ABDOMEN - 1 VIEW COMPARISON:  CT Abdomen and Pelvis 04/20/2020. FINDINGS: Portable AP supine views at 0939 hours. New left double-J ureteral stent in place. Proximal and distal loops appear appropriately position. No definite visualization of the obstructing calculus which had been at the L3-L4 level on the recent CT. Increased bowel gas but gas pattern is stable and nonobstructed. Stable cholecystectomy clips. No acute osseous  abnormality identified. IMPRESSION: 1. New left double-J ureteral stent in good position. 2. No definite visualization of the obstructing calculus which had been at the L3-L4 level on recent CT. Electronically Signed   By: Odessa Fleming M.D.   On: 04/22/2020 11:41   DG CHEST PORT 1 VIEW  Result Date: 04/21/2020 CLINICAL DATA:  Central line placement EXAM: PORTABLE CHEST 1 VIEW COMPARISON:  03/14/2020 FINDINGS: Right central line tip is in the SVC. No pneumothorax. Mild vascular congestion and bibasilar atelectasis. No effusions or acute bony abnormality. IMPRESSION: Right central line tip in the SVC.  No pneumothorax. Vascular congestion and bibasilar atelectasis. Electronically Signed   By: Charlett Nose M.D.   On: 04/21/2020 00:32   DG C-Arm 1-60 Min-No Report  Result Date: 04/20/2020 Fluoroscopy was utilized by the requesting physician.  No radiographic interpretation.   DG Abd 1 View  Final Result    DG CHEST PORT 1 VIEW  Final Result    DG C-Arm 1-60 Min-No Report  Final Result    CT ABDOMEN PELVIS W CONTRAST  Final Result      Scheduled Meds: . amLODipine  10 mg Oral Daily  . Chlorhexidine Gluconate Cloth  6 each Topical Daily  . enoxaparin (LOVENOX) injection  60 mg Subcutaneous Q24H  . sodium chloride flush  10-40 mL Intracatheter Q12H   PRN Meds: acetaminophen **OR** acetaminophen, guaiFENesin-dextromethorphan, hydrALAZINE, labetalol, melatonin, oxyCODONE, prochlorperazine, promethazine, sodium chloride flush Continuous Infusions: . sodium chloride    . cefTRIAXone (ROCEPHIN)  IV Stopped (04/22/20 0839)     LOS: 2 days  Time spent: Greater than 50% of the 35 minute visit was spent in counseling/coordination of care for the patient as  laid out in the A&P.   Lewie Chamber, MD [facility] 04/22/2020, 12:38 PM

## 2020-04-22 NOTE — Progress Notes (Addendum)
Urology Progress Note  Physician requesting consult: Joen Laura, MD  Reason for consult: Infected obstructing ureteral stone  Subjective: Feeling much better this morning, eager to get out of bed and walk around. Tolerating diet. Pressors discontinued yesterday.  Urine cultures growing e coli, blood cultures negative x 24 hours.   Urine output 4.6L recorded past 24 hours   Physical Exam:  Vital signs in last 24 hours: Temp:  [97.7 F (36.5 C)-99 F (37.2 C)] 97.7 F (36.5 C) (02/06 0402) Pulse Rate:  [29-148] 82 (02/06 0800) Resp:  [14-28] 21 (02/06 0800) BP: (110-142)/(54-90) 141/87 (02/06 0800) SpO2:  [78 %-100 %] 96 % (02/06 0800) Constitutional:  Alert and oriented, No acute distress Cardiovascular: Regular rate and rhythm Respiratory: Normal respiratory effort GU: Foley in place, urine clear yellow Lymphatic: No lymphadenopathy Psychiatric: Normal mood and affect  Laboratory Data:  Recent Labs    04/20/20 0659 04/21/20 0530 04/22/20 0328  WBC 16.9* 17.4* 19.5*  HGB 13.9 10.5* 10.5*  HCT 40.0 31.2* 30.8*  PLT 301 141* 117*    Recent Labs    04/20/20 0659 04/20/20 1907 04/21/20 0530 04/22/20 0328  NA 134* 138 138 141  K 3.1* 3.0* 4.1 3.5  CL 104 107 109 110  GLUCOSE 137* 115* 138* 102*  BUN 15 23* 29* 29*  CALCIUM 9.0 7.2* 7.5* 8.0*  CREATININE 1.45* 2.50* 1.80* 1.21*     Results for orders placed or performed during the hospital encounter of 04/20/20 (from the past 24 hour(s))  Basic metabolic panel     Status: Abnormal   Collection Time: 04/22/20  3:28 AM  Result Value Ref Range   Sodium 141 135 - 145 mmol/L   Potassium 3.5 3.5 - 5.1 mmol/L   Chloride 110 98 - 111 mmol/L   CO2 18 (L) 22 - 32 mmol/L   Glucose, Bld 102 (H) 70 - 99 mg/dL   BUN 29 (H) 6 - 20 mg/dL   Creatinine, Ser 7.82 (H) 0.44 - 1.00 mg/dL   Calcium 8.0 (L) 8.9 - 10.3 mg/dL   GFR, Estimated 58 (L) >60 mL/min   Anion gap 13 5 - 15  CBC with Differential/Platelet      Status: Abnormal   Collection Time: 04/22/20  3:28 AM  Result Value Ref Range   WBC 19.5 (H) 4.0 - 10.5 K/uL   RBC 3.35 (L) 3.87 - 5.11 MIL/uL   Hemoglobin 10.5 (L) 12.0 - 15.0 g/dL   HCT 95.6 (L) 21.3 - 08.6 %   MCV 91.9 80.0 - 100.0 fL   MCH 31.3 26.0 - 34.0 pg   MCHC 34.1 30.0 - 36.0 g/dL   RDW 57.8 46.9 - 62.9 %   Platelets 117 (L) 150 - 400 K/uL   nRBC 0.0 0.0 - 0.2 %   Neutrophils Relative % 88 %   Neutro Abs 17.2 (H) 1.7 - 7.7 K/uL   Lymphocytes Relative 7 %   Lymphs Abs 1.3 0.7 - 4.0 K/uL   Monocytes Relative 3 %   Monocytes Absolute 0.6 0.1 - 1.0 K/uL   Eosinophils Relative 0 %   Eosinophils Absolute 0.0 0.0 - 0.5 K/uL   Basophils Relative 1 %   Basophils Absolute 0.1 0.0 - 0.1 K/uL   WBC Morphology MILD LEFT SHIFT (1-5% METAS, OCC MYELO, OCC BANDS)    Immature Granulocytes 1 %   Abs Immature Granulocytes 0.16 (H) 0.00 - 0.07 K/uL   Reactive, Benign Lymphocytes PRESENT   Magnesium     Status:  Abnormal   Collection Time: 04/22/20  3:28 AM  Result Value Ref Range   Magnesium 2.8 (H) 1.7 - 2.4 mg/dL   Recent Results (from the past 240 hour(s))  SARS CORONAVIRUS 2 (TAT 6-24 HRS) Nasopharyngeal Nasopharyngeal Swab     Status: None   Collection Time: 04/19/20  9:12 PM   Specimen: Nasopharyngeal Swab  Result Value Ref Range Status   SARS Coronavirus 2 NEGATIVE NEGATIVE Final    Comment: (NOTE) SARS-CoV-2 target nucleic acids are NOT DETECTED.  The SARS-CoV-2 RNA is generally detectable in upper and lower respiratory specimens during the acute phase of infection. Negative results do not preclude SARS-CoV-2 infection, do not rule out co-infections with other pathogens, and should not be used as the sole basis for treatment or other patient management decisions. Negative results must be combined with clinical observations, patient history, and epidemiological information. The expected result is Negative.  Fact Sheet for  Patients: HairSlick.no  Fact Sheet for Healthcare Providers: quierodirigir.com  This test is not yet approved or cleared by the [place] FDA and  has been authorized for detection and/or diagnosis of SARS-CoV-2 by FDA under an Emergency Use Authorization (EUA). This EUA will remain  in effect (meaning this test can be used) for the duration of the COVID-19 declaration under Se ction 564(b)(1) of the Act, 21 U.S.C. section 360bbb-3(b)(1), unless the authorization is terminated or revoked sooner.  Performed at [facility], [address] [place], [place] [address]   Urine culture     Status: Abnormal   Collection Time: 04/20/20  6:54 AM   Specimen: Urine, Random  Result Value Ref Range Status   Specimen Description URINE, RANDOM  Final   Special Requests   Final    NONE Performed at [facility], [address] [place], [place] [address]    Culture >=100,000 COLONIES/mL ESCHERICHIA COLI (A)  Final   Report Status 04/22/2020 FINAL  Final   Organism ID, Bacteria ESCHERICHIA COLI (A)  Final      Susceptibility   Escherichia coli - MIC*    AMPICILLIN 8 SENSITIVE Sensitive     CEFAZOLIN <=4 SENSITIVE Sensitive     CEFEPIME <=0.12 SENSITIVE Sensitive     CEFTRIAXONE <=0.25 SENSITIVE Sensitive     CIPROFLOXACIN 1 SENSITIVE Sensitive     GENTAMICIN <=1 SENSITIVE Sensitive     IMIPENEM <=0.25 SENSITIVE Sensitive     NITROFURANTOIN 128 RESISTANT Resistant     TRIMETH/SULFA <=20 SENSITIVE Sensitive     AMPICILLIN/SULBACTAM 4 SENSITIVE Sensitive     PIP/TAZO <=4 SENSITIVE Sensitive     * >=100,000 COLONIES/mL ESCHERICHIA COLI  Culture, blood (routine x 2)     Status: None (Preliminary result)   Collection Time: 04/20/20  2:13 PM   Specimen: BLOOD  Result Value Ref Range Status   Specimen Description   Final    BLOOD RIGHT ANTECUBITAL Performed at [facility], [address]  [place], [place] [address]    Special Requests   Final    BOTTLES DRAWN AEROBIC AND ANAEROBIC Blood Culture adequate volume Performed at [facility], [address], [place], [place] [address]    Culture   Final    NO GROWTH < 24 HOURS Performed at [facility], [address], [place], [place] [address]    Report Status PENDING  Incomplete  Culture, blood (routine x 2)     Status: None (Preliminary result)   Collection Time: 04/20/20  2:13 PM   Specimen: BLOOD  Result Value Ref Range Status   Specimen Description   Final    BLOOD LEFT ANTECUBITAL Performed at [facility], [address], [place], [place] [address]    Special Requests   Final    BOTTLES DRAWN AEROBIC AND ANAEROBIC Blood Culture adequate volume Performed at [facility], [address] [place], [place] [address]    Culture   Final    NO GROWTH < 24 HOURS Performed at [facility], [address] [place], [place] [address]    Report Status PENDING  Incomplete  Urine Culture     Status: Abnormal   Collection Time: 04/20/20  4:36 PM   Specimen: PATH Other; GU  Result Value Ref Range Status   Specimen Description   Final    URINE, RANDOM  LEFT RENAL PELVIS Performed at [facility], [address], [place], [place] [address]    Special Requests   Final    NONE Performed at [facility], [address], [place], [place] [address]    Culture MULTIPLE SPECIES PRESENT, SUGGEST RECOLLECTION (A)  Final   Report Status 04/22/2020 FINAL  Final  MRSA PCR Screening     Status: None   Collection Time: 04/21/20  8:11 AM   Specimen: Nasal Mucosa; Nasopharyngeal  Result Value Ref Range Status   MRSA by PCR NEGATIVE NEGATIVE Final    Comment:        The GeneXpert MRSA Assay (FDA approved for NASAL specimens only), is one component of a comprehensive MRSA colonization surveillance program. It is not intended to  diagnose MRSA infection nor to guide or monitor treatment for MRSA infections. Performed at [facility], [address], [place], [place] [address]     Renal Function: Recent Labs    04/20/20 0659 04/20/20 1907 04/21/20 0530 04/22/20 0328  CREATININE 1.45* 2.50* 1.80* 1.21*   Estimated Creatinine Clearance: 88 mL/min (A) (by C-G formula based on SCr of 1.21 mg/dL (H)).  Radiologic Imaging: CT ABDOMEN PELVIS W CONTRAST  Result Date: 04/20/2020 CLINICAL DATA:  Left flank pain. Kidney stone suspected. Suspected pyelonephritis. Vomiting and fever. EXAM: CT ABDOMEN AND PELVIS WITH CONTRAST TECHNIQUE: Multidetector CT imaging of the abdomen and pelvis was performed using the standard protocol following bolus administration of intravenous contrast. CONTRAST:  OMNIPAQUE IOHEXOL 300 MG/ML  SOLN COMPARISON:  None. FINDINGS: Lower chest: Mild atelectasis in the left base. No other acute abnormalities in the lower chest. Hepatobiliary: No focal liver abnormality is seen. Status post cholecystectomy. No biliary dilatation. Pancreas: Unremarkable. No pancreatic ductal dilatation or surrounding inflammatory changes. Spleen: Normal in size without focal abnormality. Adrenals/Urinary Tract: There is a 1 or 2 mm stone in the right kidney seen on coronal image 94. A probable punctate stone is seen in the right kidney on coronal image 81. No masses, hydronephrosis, or perinephric stranding on the right. The right ureter is normal. The left kidney is enlarged compared to the right with less enhancement of the left renal cortex compared to the right. No striated nephrogram identified. There is a 2 mm stone in the left kidney seen on coronal image 70. No other stones are seen in the left kidney. There is moderate hydronephrosis and perinephric stranding on the left. The proximal left ureter is dilated. There is a 5 mm stone in the proximal left ureter seen on coronal image 75. The remainder  of the left  ureter is unremarkable and on dilated with no additional stones identified. The bladder is normal. Stomach/Bowel: Stomach is within normal limits. Appendix appears normal. No evidence of bowel wall thickening, distention, or inflammatory changes. Vascular/Lymphatic: No significant vascular findings are present. No enlarged abdominal or pelvic lymph nodes. Reproductive: Uterus and bilateral adnexa are unremarkable. Other: No abdominal wall hernia or abnormality. No abdominopelvic ascites. Musculoskeletal: No acute or significant osseous findings. IMPRESSION: 1. There is a 5 mm stone in the proximal left ureter resulting in ureterectasis, moderate hydronephrosis, and moderate perinephric stranding. There is also decreased enhancement in the left renal cortex versus the right suggesting significant obstruction. No abscess. No striated nephrogram to confirm the clinical suspicion of pyelonephritis. 2. Small stones in both kidneys as above. Electronically Signed   By: Gerome Sam III M.D   On: 04/20/2020 09:42   DG CHEST PORT 1 VIEW  Result Date: 04/21/2020 CLINICAL DATA:  Central line placement EXAM: PORTABLE CHEST 1 VIEW COMPARISON:  03/14/2020 FINDINGS: Right central line tip is in the SVC. No pneumothorax. Mild vascular congestion and bibasilar atelectasis. No effusions or acute bony abnormality. IMPRESSION: Right central line tip in the SVC.  No pneumothorax. Vascular congestion and bibasilar atelectasis. Electronically Signed   By: Charlett Nose M.D.   On: 04/21/2020 00:32   DG C-Arm 1-60 Min-No Report  Result Date: 04/20/2020 Fluoroscopy was utilized by the requesting physician.  No radiographic interpretation.    I independently reviewed the above imaging studies.  Impression/Recommendation: Left ureteral calculus with urosepsis s/p left ureteral stent placement, recovering well now off pressors.   -  Ok to discontinue foley catheter - Narrow antibiotics pending final culture  results, would recommend 14 total day treatment course - Will schedule patient for definitive outpatient stone treatment. Will plan to obtain KUB prior to discharge (ordered) to see if stone would be amenable to ESWL  Thea Alken 04/22/2020, 8:15 AM  I performed a history and physical examination of the patient and discussed the patients management with the resident.  I reviewed the resident's note and agree with the documented findings and plan of care.

## 2020-04-22 NOTE — Progress Notes (Signed)
Pt was cleaned up and packed up all belongings. Daughter had pt's bag. Pt was placed in a wheelchair. Pt was stable. Nurse tech took pt to the 6E med surg.

## 2020-04-22 NOTE — Progress Notes (Signed)
PCCM:  Patient is off pressors. DC CVL once PIV obtained CCM will sign off. Please call with any questions.   Josephine Igo, DO [facility] 04/22/2020 7:16 AM

## 2020-04-23 ENCOUNTER — Other Ambulatory Visit (HOSPITAL_COMMUNITY): Payer: Self-pay | Admitting: Internal Medicine

## 2020-04-23 LAB — CBC WITH DIFFERENTIAL/PLATELET
Abs Immature Granulocytes: 0.32 10*3/uL — ABNORMAL HIGH (ref 0.00–0.07)
Basophils Absolute: 0.1 10*3/uL (ref 0.0–0.1)
Basophils Relative: 0 %
Eosinophils Absolute: 0 10*3/uL (ref 0.0–0.5)
Eosinophils Relative: 0 %
HCT: 30.8 % — ABNORMAL LOW (ref 36.0–46.0)
Hemoglobin: 10.4 g/dL — ABNORMAL LOW (ref 12.0–15.0)
Immature Granulocytes: 2 %
Lymphocytes Relative: 11 %
Lymphs Abs: 2.4 10*3/uL (ref 0.7–4.0)
MCH: 30.1 pg (ref 26.0–34.0)
MCHC: 33.8 g/dL (ref 30.0–36.0)
MCV: 89 fL (ref 80.0–100.0)
Monocytes Absolute: 0.9 10*3/uL (ref 0.1–1.0)
Monocytes Relative: 4 %
Neutro Abs: 17.9 10*3/uL — ABNORMAL HIGH (ref 1.7–7.7)
Neutrophils Relative %: 83 %
Platelets: 113 10*3/uL — ABNORMAL LOW (ref 150–400)
RBC: 3.46 MIL/uL — ABNORMAL LOW (ref 3.87–5.11)
RDW: 14.6 % (ref 11.5–15.5)
WBC: 21.6 10*3/uL — ABNORMAL HIGH (ref 4.0–10.5)
nRBC: 0.1 % (ref 0.0–0.2)

## 2020-04-23 LAB — BASIC METABOLIC PANEL
Anion gap: 11 (ref 5–15)
BUN: 20 mg/dL (ref 6–20)
CO2: 21 mmol/L — ABNORMAL LOW (ref 22–32)
Calcium: 8.3 mg/dL — ABNORMAL LOW (ref 8.9–10.3)
Chloride: 107 mmol/L (ref 98–111)
Creatinine, Ser: 0.73 mg/dL (ref 0.44–1.00)
GFR, Estimated: >60 mL/min (ref >60)
Glucose, Bld: 83 mg/dL (ref 70–99)
Potassium: 3.4 mmol/L — ABNORMAL LOW (ref 3.5–5.1)
Sodium: 139 mmol/L (ref 135–145)

## 2020-04-23 LAB — MAGNESIUM: Magnesium: 2.4 mg/dL (ref 1.7–2.4)

## 2020-04-23 MED ORDER — AMLODIPINE BESYLATE 10 MG PO TABS: 10.0000 mg | ORAL_TABLET | Freq: Every day | ORAL | 3 refills | Status: DC

## 2020-04-23 MED ORDER — CEFDINIR 300 MG PO CAPS: 300.0000 mg | ORAL_CAPSULE | Freq: Two times a day (BID) | ORAL | 0 refills | Status: DC

## 2020-04-23 MED ORDER — POTASSIUM CHLORIDE CRYS ER 20 MEQ PO TBCR
40.0000 meq | EXTENDED_RELEASE_TABLET | Freq: Once | ORAL | Status: AC
  Administered 2020-04-23: 40 meq via ORAL
  Filled 2020-04-23: qty 2

## 2020-04-23 MED FILL — CEFDINIR 300 MG CAPSULE: 300 | 10 days supply | Qty: 20 | Fill #0

## 2020-04-23 MED FILL — AMLODIPINE BESYLATE 10 MG T: 10 | 30 days supply | Qty: 30 | Fill #0

## 2020-04-23 NOTE — Discharge Summary (Signed)
Physician Discharge Summary   Melissa Cooley TDV:[id number] DOB: 1980/09/17 DOA: 04/20/2020  PCP: Hoy Register, MD  Admit date: 04/20/2020 Discharge date: 04/23/2020  Admitted From: home Disposition:  home Discharging physician: Lewie Chamber, MD  Recommendations for Outpatient Follow-up:  1. Follow-up with urology   Patient discharged to home in Discharge Condition: stable Risk of unplanned readmission score: Unplanned Admission- Pilot do not use: 19.95  CODE STATUS: Full Diet recommendation:  Diet Orders (From admission, onward)    Start     Ordered   04/23/20 0000  Diet general        04/23/20 1022   04/20/20 2108  Diet regular Room service appropriate? Yes; Fluid consistency: Thin  Diet effective now       Question Answer Comment  Room service appropriate? Yes   Fluid consistency: Thin      04/20/20 2107          Hospital Course: Melissa Cooley is a 40 yo female with PMH HTN, obesity who presented to the hospital with left flank pain, vomiting, dysuria.  She also had endorsed change in odor of her urine and dysuria. CT abdomen/pelvis on admission showed a left ureteral 5 mm stone with moderate hydronephrosis and perinephric stranding. She developed progressive hypotension refractory to fluids and required initiation of vasopressors for blood pressure support.  She was evaluated by urology and taken to the OR and underwent left ureteral stent placement. She responded well clinically and had good improvement in renal function and return of urine output.  She was weaned quickly off of vasopressors. Urine culture grew E. coli.  She was transitioned to [facility] at discharge to complete a total of 2-week course.  She will follow-up with urology outpatient for definitive stone management and stent removal.   * Septic shock (HCC)-resolved as of 04/22/2020 - tacycardia (122), tachypnea (34), lactic acid (4.8>>5.5), source urinary from obstructed L ureteral stone, and also refractory to  IVF, started on neo initially then transitioned to Levophed - s/p RIJ placed on 2/4 -Levophed weaned off on 04/21/2020. -Okay to remove right IJ on 04/22/2020 -See pyelonephritis  Acute pyelonephritis -CT abdomen/pelvis on admission showed obstructing left ureteral stone with perinephric stranding -Likely will need 2-week course of antibiotic; urology also recommending 14-day course; Omnicef given at discharge to complete course -Urine culture growing E. Coli; continue on Rocephin while hospitalized and will likely de-escalate to [facility] at discharge to complete course  AKI (acute kidney injury) (HCC)-resolved as of 04/23/2020 - baseline creatinine ~ 1 - creat peaked at 2.5 - patient presents with increase in creat >0.3 mg/dL above baseline, presumed to have occurred within past 7 days PTA -Etiology considered due to outlet obstruction from obstructed ureteral stone as well as dehydration/prerenal -Holding ARB and HCTZ - s/p fluids; hold off on further for now; continue diet -Okay to remove Foley per urology   Normocytic anemia -Previous hemoglobin 12 to 13 g/dL.  -Slight hemoglobin drop compared to admission, possibly due to hemodilution from fluids  Hypokalemia -Potassium low on admission -Repleted  Hypertension -Blood pressure has recovered -Still holding ARB and HCTZ -Start on amlodipine; continued at discharge    The patient's chronic medical conditions were treated accordingly per the patient's home medication regimen except as noted.  On day of discharge, patient was felt deemed stable for discharge. Patient/family member advised to call PCP or come back to ER if needed.   Principal Diagnosis: Septic shock [facility])  Discharge Diagnoses: Active Hospital Problems   Diagnosis Date Noted  .  Acute pyelonephritis 04/21/2020    Priority: High  . Hypokalemia 04/21/2020  . Normocytic anemia 04/21/2020  . Hypertension 02/20/2017    Resolved Hospital Problems   Diagnosis Date  Noted Date Resolved  . Septic shock (HCC) 04/20/2020 04/22/2020    Priority: High  . AKI (acute kidney injury) (HCC) 04/21/2020 04/23/2020    Priority: Medium    Discharge Instructions    Diet general   Complete by: As directed    Increase activity slowly   Complete by: As directed      Allergies as of 04/23/2020   No Known Allergies     Medication List    STOP taking these medications   acetaminophen 500 MG tablet Commonly known as: TYLENOL   amoxicillin 875 MG tablet Commonly known as: AMOXIL   benzonatate 100 MG capsule Commonly known as: TESSALON   cetirizine 10 MG tablet Commonly known as: ZyrTEC Allergy   hydrochlorothiazide 12.5 MG capsule Commonly known as: MICROZIDE   hydrOXYzine 10 MG tablet Commonly known as: ATARAX/VISTARIL   ibuprofen 600 MG tablet Commonly known as: ADVIL   irbesartan 150 MG tablet Commonly known as: AVAPRO   ondansetron 4 MG tablet Commonly known as: ZOFRAN   predniSONE 20 MG tablet Commonly known as: DELTASONE   promethazine-dextromethorphan 6.25-15 MG/5ML syrup Commonly known as: PROMETHAZINE-DM   VITAMIN D PO     TAKE these medications   amLODipine 10 MG tablet Commonly known as: NORVASC Take 1 tablet (10 mg total) by mouth daily. Start taking on: April 24, 2020   cefdinir 300 MG capsule Commonly known as: OMNICEF Take 1 capsule (300 mg total) by mouth 2 (two) times daily for 10 days.       No Known Allergies  Consultations: Urology  Discharge Exam: BP (!) 106/92 (BP Location: Right Arm)   Pulse 91   Temp 99.6 F (37.6 C) (Oral)   Resp 16   Ht 5\' 11"  (1.803 m)   Wt 117 kg   LMP 04/06/2020 (Approximate)   SpO2 93%   BMI 35.98 kg/m  General appearance: alert, cooperative and no distress Head: Normocephalic, without obvious abnormality, atraumatic  Neck: R IJ noted in placed Eyes: EOMI Lungs: clear to auscultation bilaterally Heart: regular rate and rhythm and S1, S2 normal Abdomen: normal  findings: bowel sounds normal and soft, non-tender Extremities: no edema  Back: no CVA TTP Skin: mobility and turgor normal Neurologic: Grossly normal   The results of significant diagnostics from this hospitalization (including imaging, microbiology, ancillary and laboratory) are listed below for reference.   Microbiology: Recent Results (from the past 240 hour(s))  SARS CORONAVIRUS 2 (TAT 6-24 HRS) Nasopharyngeal Nasopharyngeal Swab     Status: None   Collection Time: 04/19/20  9:12 PM   Specimen: Nasopharyngeal Swab  Result Value Ref Range Status   SARS Coronavirus 2 NEGATIVE NEGATIVE Final    Comment: (NOTE) SARS-CoV-2 target nucleic acids are NOT DETECTED.  The SARS-CoV-2 RNA is generally detectable in upper and lower respiratory specimens during the acute phase of infection. Negative results do not preclude SARS-CoV-2 infection, do not rule out co-infections with other pathogens, and should not be used as the sole basis for treatment or other patient management decisions. Negative results must be combined with clinical observations, patient history, and epidemiological information. The expected result is Negative.  Fact Sheet for Patients: HairSlick.no  Fact Sheet for Healthcare Providers: quierodirigir.com  This test is not yet approved or cleared by the [place] FDA and  has  been authorized for detection and/or diagnosis of SARS-CoV-2 by FDA under an Emergency Use Authorization (EUA). This EUA will remain  in effect (meaning this test can be used) for the duration of the COVID-19 declaration under Se ction 564(b)(1) of the Act, 21 U.S.C. section 360bbb-3(b)(1), unless the authorization is terminated or revoked sooner.  Performed at [facility], [address] [place], [place] [address]   Urine culture     Status: Abnormal   Collection Time: 04/20/20  6:54 AM   Specimen: Urine, Random  Result  Value Ref Range Status   Specimen Description URINE, RANDOM  Final   Special Requests   Final    NONE Performed at [facility], [address] [place], [place] [address]    Culture >=100,000 COLONIES/mL ESCHERICHIA COLI (A)  Final   Report Status 04/22/2020 FINAL  Final   Organism ID, Bacteria ESCHERICHIA COLI (A)  Final      Susceptibility   Escherichia coli - MIC*    AMPICILLIN 8 SENSITIVE Sensitive     CEFAZOLIN <=4 SENSITIVE Sensitive     CEFEPIME <=0.12 SENSITIVE Sensitive     CEFTRIAXONE <=0.25 SENSITIVE Sensitive     CIPROFLOXACIN 1 SENSITIVE Sensitive     GENTAMICIN <=1 SENSITIVE Sensitive     IMIPENEM <=0.25 SENSITIVE Sensitive     NITROFURANTOIN 128 RESISTANT Resistant     TRIMETH/SULFA <=20 SENSITIVE Sensitive     AMPICILLIN/SULBACTAM 4 SENSITIVE Sensitive     PIP/TAZO <=4 SENSITIVE Sensitive     * >=100,000 COLONIES/mL ESCHERICHIA COLI  Culture, blood (routine x 2)     Status: None (Preliminary result)   Collection Time: 04/20/20  2:13 PM   Specimen: BLOOD  Result Value Ref Range Status   Specimen Description   Final    BLOOD RIGHT ANTECUBITAL Performed at [facility], [address] [place], [place] [address]    Special Requests   Final    BOTTLES DRAWN AEROBIC AND ANAEROBIC Blood Culture adequate volume Performed at [facility], [address] [place], [place] [address]    Culture   Final    NO GROWTH 3 DAYS Performed at [facility], [address] [place], [place] [address]    Report Status PENDING  Incomplete  Culture, blood (routine x 2)     Status: None (Preliminary result)   Collection Time: 04/20/20  2:13 PM   Specimen: BLOOD  Result Value Ref Range Status   Specimen Description   Final    BLOOD LEFT ANTECUBITAL Performed at [facility], [address] [place], [place] [address]    Special Requests   Final    BOTTLES DRAWN AEROBIC AND ANAEROBIC Blood Culture adequate  volume Performed at [facility], [address] [place], [place] [address]    Culture   Final    NO GROWTH 3 DAYS Performed at [facility], [address] [place], [place] [address]    Report Status PENDING  Incomplete  Urine Culture     Status: Abnormal   Collection Time: 04/20/20  4:36 PM   Specimen: PATH Other; GU  Result Value Ref Range Status   Specimen Description   Final    URINE, RANDOM  LEFT RENAL PELVIS Performed at [facility], [address] [place], [place] [address]    Special Requests   Final    NONE Performed at [facility], [address] [place], [place] [address]    Culture MULTIPLE SPECIES PRESENT, SUGGEST RECOLLECTION (A)  Final   Report Status 04/22/2020 FINAL  Final  MRSA PCR Screening     Status: None   Collection Time: 04/21/20  8:11 AM   Specimen: Nasal Mucosa; Nasopharyngeal  Result Value Ref Range Status   MRSA by PCR NEGATIVE NEGATIVE Final    Comment:        The GeneXpert MRSA Assay (FDA approved for NASAL specimens only), is one component of a comprehensive MRSA colonization surveillance program. It is not intended to diagnose MRSA infection nor to guide or monitor treatment for MRSA infections. Performed at [facility], [address] [place], [place] [address]      Labs: BNP (last 3 results) No results for input(s): BNP in the last 8760 hours. Basic Metabolic Panel: Recent Labs  Lab 04/20/20 0659 04/20/20 1419 04/20/20 1907 04/21/20 0530 04/22/20 0328 04/23/20 0506  NA 134*  --  138 138 141 139  K 3.1*  --  3.0* 4.1 3.5 3.4*  CL 104  --  107 109 110 107  CO2 17*  --  16* 17* 18* 21*  GLUCOSE 137*  --  115* 138* 102* 83  BUN 15  --  23* 29* 29* 20  CREATININE 1.45*  --  2.50* 1.80* 1.21* 0.73  CALCIUM 9.0  --  7.2* 7.5* 8.0* 8.3*  MG  --  1.3*  --  1.9 2.8* 2.4   Liver Function Tests: Recent Labs  Lab 04/20/20 0659  AST 32  ALT  27  ALKPHOS 70  BILITOT 1.1  PROT 7.5  ALBUMIN 3.7   Recent Labs  Lab 04/20/20 0659  LIPASE 28   No results for input(s): AMMONIA in the last 168 hours. CBC: Recent Labs  Lab 04/20/20 0659 04/21/20 0530 04/22/20 0328 04/23/20 0506  WBC 16.9* 17.4* 19.5* 21.6*  NEUTROABS  --   --  17.2* 17.9*  HGB 13.9 10.5* 10.5* 10.4*  HCT 40.0 31.2* 30.8* 30.8*  MCV 90.5 92.9 91.9 89.0  PLT 301 141* 117* 113*   Cardiac Enzymes: No results for input(s): CKTOTAL, CKMB, CKMBINDEX, TROPONINI in the last 168 hours. BNP: Invalid input(s): POCBNP CBG: Recent Labs  Lab 04/21/20 0337  GLUCAP 140*   D-Dimer No results for input(s): DDIMER in the last 72 hours. Hgb A1c No results for input(s): HGBA1C in the last 72 hours. Lipid Profile No results for input(s): CHOL, HDL, LDLCALC, TRIG, CHOLHDL, LDLDIRECT in the last 72 hours. Thyroid function studies No results for input(s): TSH, T4TOTAL, T3FREE, THYROIDAB in the last 72 hours.  Invalid input(s): FREET3 Anemia work up No results for input(s): VITAMINB12, FOLATE, FERRITIN, TIBC, IRON, RETICCTPCT in the last 72 hours. Urinalysis    Component Value Date/Time   COLORURINE YELLOW 04/20/2020 0654   APPEARANCEUR HAZY (A) 04/20/2020 0654   LABSPEC 1.024 04/20/2020 0654   PHURINE 5.0 04/20/2020 0654   GLUCOSEU NEGATIVE 04/20/2020 0654   HGBUR NEGATIVE 04/20/2020 0654   BILIRUBINUR NEGATIVE 04/20/2020 0654   BILIRUBINUR negative 12/23/2017 1636   BILIRUBINUR Negative 07/29/2017 0929   KETONESUR NEGATIVE 04/20/2020 0654   PROTEINUR 30 (A) 04/20/2020 0654   UROBILINOGEN 1.0 12/23/2017 1636   UROBILINOGEN 1.0 01/26/2017 1453   NITRITE POSITIVE (A) 04/20/2020 0654   LEUKOCYTESUR TRACE (A) 04/20/2020 0654   Sepsis Labs Invalid input(s): PROCALCITONIN,  WBC,  LACTICIDVEN Microbiology Recent Results (from the past 240 hour(s))  SARS CORONAVIRUS 2 (TAT 6-24 HRS) Nasopharyngeal Nasopharyngeal  Swab     Status: None   Collection Time:  04/19/20  9:12 PM   Specimen: Nasopharyngeal Swab  Result Value Ref Range Status   SARS Coronavirus 2 NEGATIVE NEGATIVE Final    Comment: (NOTE) SARS-CoV-2 target nucleic acids are NOT DETECTED.  The SARS-CoV-2 RNA is generally detectable in upper and lower respiratory specimens during the acute phase of infection. Negative results do not preclude SARS-CoV-2 infection, do not rule out co-infections with other pathogens, and should not be used as the sole basis for treatment or other patient management decisions. Negative results must be combined with clinical observations, patient history, and epidemiological information. The expected result is Negative.  Fact Sheet for Patients: HairSlick.no  Fact Sheet for Healthcare Providers: quierodirigir.com  This test is not yet approved or cleared by the [place] FDA and  has been authorized for detection and/or diagnosis of SARS-CoV-2 by FDA under an Emergency Use Authorization (EUA). This EUA will remain  in effect (meaning this test can be used) for the duration of the COVID-19 declaration under Se ction 564(b)(1) of the Act, 21 U.S.C. section 360bbb-3(b)(1), unless the authorization is terminated or revoked sooner.  Performed at [facility], [address] [place], [place] [address]   Urine culture     Status: Abnormal   Collection Time: 04/20/20  6:54 AM   Specimen: Urine, Random  Result Value Ref Range Status   Specimen Description URINE, RANDOM  Final   Special Requests   Final    NONE Performed at [facility], [address], [place], [place] [address]    Culture >=100,000 COLONIES/mL ESCHERICHIA COLI (A)  Final   Report Status 04/22/2020 FINAL  Final   Organism ID, Bacteria ESCHERICHIA COLI (A)  Final      Susceptibility   Escherichia coli - MIC*    AMPICILLIN 8 SENSITIVE Sensitive     CEFAZOLIN <=4 SENSITIVE Sensitive     CEFEPIME <=0.12  SENSITIVE Sensitive     CEFTRIAXONE <=0.25 SENSITIVE Sensitive     CIPROFLOXACIN 1 SENSITIVE Sensitive     GENTAMICIN <=1 SENSITIVE Sensitive     IMIPENEM <=0.25 SENSITIVE Sensitive     NITROFURANTOIN 128 RESISTANT Resistant     TRIMETH/SULFA <=20 SENSITIVE Sensitive     AMPICILLIN/SULBACTAM 4 SENSITIVE Sensitive     PIP/TAZO <=4 SENSITIVE Sensitive     * >=100,000 COLONIES/mL ESCHERICHIA COLI  Culture, blood (routine x 2)     Status: None (Preliminary result)   Collection Time: 04/20/20  2:13 PM   Specimen: BLOOD  Result Value Ref Range Status   Specimen Description   Final    BLOOD RIGHT ANTECUBITAL Performed at [facility], [address], [place], [place] [address]    Special Requests   Final    BOTTLES DRAWN AEROBIC AND ANAEROBIC Blood Culture adequate volume Performed at [facility], [address] [place], [place] [address]    Culture   Final    NO GROWTH 3 DAYS Performed at [facility], [address] [place], [place] [address]    Report Status PENDING  Incomplete  Culture, blood (routine x 2)     Status: None (Preliminary result)   Collection Time: 04/20/20  2:13 PM   Specimen: BLOOD  Result Value Ref Range Status   Specimen Description   Final    BLOOD LEFT ANTECUBITAL Performed at [facility], [address], [place], [place] [address]  Special Requests   Final    BOTTLES DRAWN AEROBIC AND ANAEROBIC Blood Culture adequate volume Performed at [facility], [address] [place], [place] [address]    Culture   Final    NO GROWTH 3 DAYS Performed at [facility], [address], [place], [place] [address]    Report Status PENDING  Incomplete  Urine Culture     Status: Abnormal   Collection Time: 04/20/20  4:36 PM   Specimen: PATH Other; GU  Result Value Ref Range Status   Specimen Description   Final    URINE, RANDOM  LEFT RENAL PELVIS Performed at [facility], [address], [place], [place] [address]    Special Requests   Final    NONE Performed at [facility], [address] [place], [place] [address]    Culture MULTIPLE SPECIES PRESENT, SUGGEST RECOLLECTION (A)  Final   Report Status 04/22/2020 FINAL  Final  MRSA PCR Screening     Status: None   Collection Time: 04/21/20  8:11 AM   Specimen: Nasal Mucosa; Nasopharyngeal  Result Value Ref Range Status   MRSA by PCR NEGATIVE NEGATIVE Final    Comment:        The GeneXpert MRSA Assay (FDA approved for NASAL specimens only), is one component of a comprehensive MRSA colonization surveillance program. It is not intended to diagnose MRSA infection nor to guide or monitor treatment for MRSA infections. Performed at [facility], [address] [place], [place] [address]     Procedures/Studies: Cathleen Corti 1 View  Result Date: 04/22/2020 CLINICAL DATA:  40 year old female with left side ureteral calculus and obstruction. EXAM: ABDOMEN - 1 VIEW COMPARISON:  CT Abdomen and Pelvis 04/20/2020. FINDINGS: Portable AP supine views at 0939 hours. New left double-J ureteral stent in place. Proximal and distal loops appear appropriately position. No definite visualization of the obstructing calculus which had been at the L3-L4 level on the recent CT. Increased bowel gas but gas pattern is stable and nonobstructed. Stable cholecystectomy clips. No acute osseous abnormality identified. IMPRESSION: 1. New left double-J ureteral stent in good position. 2. No definite visualization of the obstructing calculus which had been at the L3-L4 level on recent CT. Electronically Signed   By: Odessa Fleming M.D.   On: 04/22/2020 11:41   CT ABDOMEN PELVIS W CONTRAST  Result Date: 04/20/2020 CLINICAL DATA:  Left flank pain. Kidney stone suspected. Suspected pyelonephritis. Vomiting and fever. EXAM: CT ABDOMEN AND PELVIS WITH CONTRAST TECHNIQUE: Multidetector CT imaging of  the abdomen and pelvis was performed using the standard protocol following bolus administration of intravenous contrast. CONTRAST:  OMNIPAQUE IOHEXOL 300 MG/ML  SOLN COMPARISON:  None. FINDINGS: Lower chest: Mild atelectasis in the left base. No other acute abnormalities in the lower chest. Hepatobiliary: No focal liver abnormality is seen. Status post cholecystectomy. No biliary dilatation. Pancreas: Unremarkable. No pancreatic ductal dilatation or surrounding inflammatory changes. Spleen: Normal in size without focal abnormality. Adrenals/Urinary Tract: There is a 1 or 2 mm stone in the right kidney seen on coronal image 94. A probable punctate stone is seen in the right kidney on coronal image 81. No masses, hydronephrosis, or perinephric stranding on the right. The right ureter is normal. The left kidney is enlarged compared to the right with less enhancement of the left renal cortex compared to the right. No striated nephrogram identified. There is a 2 mm stone in the left kidney  seen on coronal image 70. No other stones are seen in the left kidney. There is moderate hydronephrosis and perinephric stranding on the left. The proximal left ureter is dilated. There is a 5 mm stone in the proximal left ureter seen on coronal image 75. The remainder of the left ureter is unremarkable and on dilated with no additional stones identified. The bladder is normal. Stomach/Bowel: Stomach is within normal limits. Appendix appears normal. No evidence of bowel wall thickening, distention, or inflammatory changes. Vascular/Lymphatic: No significant vascular findings are present. No enlarged abdominal or pelvic lymph nodes. Reproductive: Uterus and bilateral adnexa are unremarkable. Other: No abdominal wall hernia or abnormality. No abdominopelvic ascites. Musculoskeletal: No acute or significant osseous findings. IMPRESSION: 1. There is a 5 mm stone in the proximal left ureter resulting in ureterectasis, moderate  hydronephrosis, and moderate perinephric stranding. There is also decreased enhancement in the left renal cortex versus the right suggesting significant obstruction. No abscess. No striated nephrogram to confirm the clinical suspicion of pyelonephritis. 2. Small stones in both kidneys as above. Electronically Signed   By: Gerome Sam III M.D   On: 04/20/2020 09:42   DG CHEST PORT 1 VIEW  Result Date: 04/21/2020 CLINICAL DATA:  Central line placement EXAM: PORTABLE CHEST 1 VIEW COMPARISON:  03/14/2020 FINDINGS: Right central line tip is in the SVC. No pneumothorax. Mild vascular congestion and bibasilar atelectasis. No effusions or acute bony abnormality. IMPRESSION: Right central line tip in the SVC.  No pneumothorax. Vascular congestion and bibasilar atelectasis. Electronically Signed   By: Charlett Nose M.D.   On: 04/21/2020 00:32   DG C-Arm 1-60 Min-No Report  Result Date: 04/20/2020 Fluoroscopy was utilized by the requesting physician.  No radiographic interpretation.     Time coordinating discharge: Over 30 minutes    Lewie Chamber, MD  [facility] 04/23/2020, 4:49 PM

## 2020-04-24 ENCOUNTER — Telehealth: Payer: Self-pay | Admitting: *Deleted

## 2020-04-24 NOTE — Telephone Encounter (Signed)
Transition Care Management Unsuccessful Follow-up Telephone Call  Date of discharge and from where:  04/23/2020 - [facility]  Attempts:  1st Attempt  Reason for unsuccessful TCM follow-up call:  Voice mail full

## 2020-04-25 LAB — CULTURE, BLOOD (ROUTINE X 2)
Culture: NO GROWTH
Culture: NO GROWTH
Special Requests: ADEQUATE
Special Requests: ADEQUATE

## 2020-04-25 NOTE — Telephone Encounter (Signed)
Transition Care Management Unsuccessful Follow-up Telephone Call  Date of discharge and from where:  04/23/2020 - [facility]  Attempts:  2nd Attempt  Reason for unsuccessful TCM follow-up call:  Left voice message

## 2020-04-26 ENCOUNTER — Other Ambulatory Visit: Payer: Self-pay

## 2020-04-26 ENCOUNTER — Ambulatory Visit (INDEPENDENT_AMBULATORY_CARE_PROVIDER_SITE_OTHER): Payer: Self-pay | Admitting: Physician Assistant

## 2020-04-26 ENCOUNTER — Other Ambulatory Visit: Payer: Self-pay | Admitting: Physician Assistant

## 2020-04-26 VITALS — BP 106/71 | HR 97 | Temp 97.3°F | Resp 18 | Ht 71.0 in | Wt 241.0 lb

## 2020-04-26 DIAGNOSIS — B3731 Acute candidiasis of vulva and vagina: Secondary | ICD-10-CM | POA: Insufficient documentation

## 2020-04-26 DIAGNOSIS — N10 Acute tubulo-interstitial nephritis: Secondary | ICD-10-CM

## 2020-04-26 DIAGNOSIS — B373 Candidiasis of vulva and vagina: Secondary | ICD-10-CM

## 2020-04-26 DIAGNOSIS — R040 Epistaxis: Secondary | ICD-10-CM

## 2020-04-26 DIAGNOSIS — I10 Essential (primary) hypertension: Secondary | ICD-10-CM

## 2020-04-26 MED ORDER — NYSTATIN 100000 UNIT/GM EX CREA: 1.0000 "application " | TOPICAL_CREAM | Freq: Two times a day (BID) | CUTANEOUS | 0 refills | Status: DC

## 2020-04-26 MED FILL — NYSTATIN 100,000 UNIT/GM CR: 100000 | 15 days supply | Qty: 30 | Fill #0

## 2020-04-26 NOTE — Telephone Encounter (Addendum)
Transition Care Management Follow-up Telephone Call  Date of discharge and from where: 04/20/2020 - [facility]  How have you been since you were released from the hospital? "Still having issues with BP and nose bleeds."  Any questions or concerns? No  Items Reviewed:  Did the pt receive and understand the discharge instructions provided? Yes   Medications obtained and verified? Yes   Other? N/A  Any new allergies since your discharge? No   Dietary orders reviewed? Yes  Do you have support at home? Yes   Home Care and Equipment/Supplies: Were home health services ordered? not applicable If so, what is the name of the agency? N/A  Has the agency set up a time to come to the patient's home? not applicable Were any new equipment or medical supplies ordered?  No What is the name of the medical supply agency? N/A Were you able to get the supplies/equipment? not applicable Do you have any questions related to the use of the equipment or supplies? No  Functional Questionnaire: (I = Independent and D = Dependent) ADLs: I  Bathing/Dressing- I  Meal Prep- I  Eating- I  Maintaining continence- I  Transferring/Ambulation- I  Managing Meds- I  Follow up appointments reviewed:   PCP Hospital f/u appt confirmed? Pt tried to call and get an appointment and was told that she could not be seen before March. I will reach out to that office and see if I can get a sooner appointment.  Called [facility], was advised that they in fact could not see the pt until the middle of March.  I was given the number to a mobile primary care unit at [facility] for the pt to possible be seen there. They will see her today at 1330.  Specialist Hospital f/u appt confirmed? No    Are transportation arrangements needed? No   If their condition worsens, is the pt aware to call PCP or go to the Emergency Dept.? Yes  Was the patient provided with contact  information for the PCP's office or ED? Yes  Was to pt encouraged to call back with questions or concerns? Yes

## 2020-04-26 NOTE — Progress Notes (Signed)
Patient has taken BP medication today. Patient has eaten today. Patient complains of HA with nosebleeds out the left nostril. Patient reports each time she has these episodes her BP is elevated. Headaches around the sides of the head to the back of the head. Patient would like advise on the kidney stent as well. Patient is grieving her sister who passed 02/23/20 and her children's father who was murdered last month. Patient will be married 4/14 and is also worried about her health prior to this event.

## 2020-04-26 NOTE — Progress Notes (Signed)
Established Patient Office Visit  Subjective:  Patient ID: Melissa Cooley, female    DOB: [birth year]  Age: 40 y.o. MRN: [id number]  CC:  Chief Complaint  Patient presents with  . Hospitalization Follow-up    HPI Melissa Cooley reports that she was hospitalized from February 4 through April 23, 2020.   Hospital Course: [name] is a 40 yo female with PMH HTN, obesity who presented to the hospital with left flank pain, vomiting, dysuria.  She also had endorsed change in odor of her urine and dysuria. CT abdomen/pelvis on admission showed a left ureteral 5 mm stone with moderate hydronephrosis and perinephric stranding. She developed progressive hypotension refractory to fluids and required initiation of vasopressors for blood pressure support.  She was evaluated by urology and taken to the OR and underwent left ureteral stent placement. She responded well clinically and had good improvement in renal function and return of urine output.  She was weaned quickly off of vasopressors. Urine culture grew E. coli.  She was transitioned to [facility] at discharge to complete a total of 2-week course.  She will follow-up with urology outpatient for definitive stone management and stent removal.   * Septic shock (HCC)-resolved as of 04/22/2020 - tacycardia (122), tachypnea (34), lactic acid (4.8>>5.5), source urinary from obstructed L ureteral stone, and also refractory to IVF, started on neo initially then transitioned to Levophed - s/p RIJ placed on 2/4 -Levophed weaned off on 04/21/2020. -Okay to remove right IJ on 04/22/2020 -See pyelonephritis  Acute pyelonephritis -CT abdomen/pelvis on admission showed obstructing left ureteral stone with perinephric stranding -Likely will need 2-week course of antibiotic; urology also recommending 14-day course; Omnicef given at discharge to complete course -Urine culture growing E. Coli; continue on Rocephin while hospitalized and will likely de-escalate  to [facility] at discharge to complete course  AKI (acute kidney injury) (HCC)-resolved as of 04/23/2020 - baseline creatinine ~ 1 - creat peaked at 2.5 - patient presents with increase in creat >0.3 mg/dL above baseline, presumed to have occurred within past 7 days PTA -Etiology considered due to outlet obstruction from obstructed ureteral stone as well as dehydration/prerenal -Holding ARB and HCTZ - s/p fluids; hold off on further for now; continue diet -Okay to remove Foley per urology   Normocytic anemia -Previous hemoglobin 12 to 13 g/dL.  -Slight hemoglobin drop compared to admission, possibly due to hemodilution from fluids  Hypokalemia -Potassium low on admission -Repleted  Hypertension -Blood pressure has recovered -Still holding ARB and HCTZ -Start on amlodipine; continued at discharge    The patient's chronic medical conditions were treated accordingly per the patient's home medication regimen except as noted.  On day of discharge, patient was felt deemed stable for discharge. Patient/family member advised to call PCP or come back to ER if needed.    States today that since she has been home she has been having several nosebleeds each day and states that she has been having vaginal itching.  Reports that she has been monitoring her blood pressure after having the nosebleeds because she will experience a headache afterwards.  Reports her blood pressures have been within normal limits to slightly elevated.  Reports the nosebleeds tend to occur after she blows her nose, will bleed for a couple of minutes.  She will use pressure with relief.  Reports that she has been drinking lots of water, has been using ibuprofen 600 mg with relief of the headaches.  Reports that she has been having vaginal itching since  she has been home, has not tried anything for relief.  Denies vaginal discharge, dysuria.    Past Medical History:  Diagnosis Date  . Anxiety   . Depression   .  Gout   . Hypertension     Past Surgical History:  Procedure Laterality Date  . CESAREAN SECTION    . CESAREAN SECTION    . CHOLECYSTECTOMY    . CYSTOSCOPY WITH RETROGRADE PYELOGRAM, URETEROSCOPY AND STENT PLACEMENT Left 04/20/2020   Procedure: CYSTOSCOPY WITH Left JJ STENT PLACEMENT;  Surgeon: Belva Agee, MD;  Location: [facility];  Service: Urology;  Laterality: Left;  . TUBAL LIGATION      Family History  Problem Relation Age of Onset  . Cancer Mother   . Drug abuse Mother   . Bipolar disorder Mother   . Breast cancer Mother 30  . Diabetes Father   . Hypertension Other   . Anxiety disorder Sister   . Drug abuse Brother   . Lupus Maternal Aunt     Social History   Socioeconomic History  . Marital status: Single    Spouse name: Not on file  . Number of children: Not on file  . Years of education: Not on file  . Highest education level: Not on file  Occupational History  . Not on file  Tobacco Use  . Smoking status: Former Games developer  . Smokeless tobacco: Never Used  Substance and Sexual Activity  . Alcohol use: No  . Drug use: No  . Sexual activity: Yes    Birth control/protection: Surgical  Other Topics Concern  . Not on file  Social History Narrative  . Not on file   Social Determinants of Health   Financial Resource Strain: Not on file  Food Insecurity: Not on file  Transportation Needs: Not on file  Physical Activity: Not on file  Stress: Not on file  Social Connections: Not on file  Intimate Partner Violence: Not on file    Outpatient Medications Prior to Visit  Medication Sig Dispense Refill  . amLODipine (NORVASC) 10 MG tablet Take 1 tablet (10 mg total) by mouth daily. 30 tablet 3  . cefdinir (OMNICEF) 300 MG capsule Take 1 capsule (300 mg total) by mouth 2 (two) times daily for 10 days. 20 capsule 0   No facility-administered medications prior to visit.    No Known Allergies  ROS Review of Systems  Constitutional: Negative for chills and  fever.  HENT: Negative.   Eyes: Negative.   Respiratory: Negative.   Gastrointestinal: Negative.   Endocrine: Negative.   Genitourinary: Negative for dysuria and vaginal discharge.  Musculoskeletal: Negative.   Skin: Negative.   Allergic/Immunologic: Negative.   Neurological: Positive for headaches. Negative for dizziness.  Hematological: Negative.   Psychiatric/Behavioral: Negative.       Objective:    Physical Exam Vitals and nursing note reviewed.  Constitutional:      General: She is not in acute distress.    Appearance: Normal appearance. She is not ill-appearing.  HENT:     Head: Normocephalic and atraumatic.     Right Ear: External ear normal.     Left Ear: External ear normal.  Eyes:     Extraocular Movements: Extraocular movements intact.     Conjunctiva/sclera: Conjunctivae normal.     Pupils: Pupils are equal, round, and reactive to light.  Cardiovascular:     Rate and Rhythm: Normal rate.     Pulses: Normal pulses.     Heart sounds: Normal  heart sounds.  Pulmonary:     Effort: Pulmonary effort is normal.     Breath sounds: Normal breath sounds.  Musculoskeletal:        General: Normal range of motion.     Cervical back: Normal range of motion and neck supple.  Skin:    General: Skin is warm and dry.  Neurological:     General: No focal deficit present.     Mental Status: She is alert and oriented to person, place, and time.  Psychiatric:        Mood and Affect: Mood normal.        Behavior: Behavior normal.        Thought Content: Thought content normal.        Judgment: Judgment normal.     BP 106/71 (BP Location: Right Arm, Patient Position: Sitting, Cuff Size: Large)   Pulse 97   Temp (!) 97.3 F (36.3 C) (Oral)   Resp 18   Ht 5\' 11"  (1.803 m)   Wt 241 lb (109.3 kg)   LMP 04/06/2020 (Approximate)   SpO2 95%   BMI 33.61 kg/m  Wt Readings from Last 3 Encounters:  04/26/20 241 lb (109.3 kg)  04/20/20 257 lb 15 oz (117 kg)  01/03/20 251  lb 9.6 oz (114.1 kg)     Health Maintenance Due  Topic Date Due  . INFLUENZA VACCINE  10/16/2019  . COVID-19 Vaccine (3 - Booster for Pfizer series) 01/18/2020    There are no preventive care reminders to display for this patient.  Lab Results  Component Value Date   TSH 0.857 06/01/2019   Lab Results  Component Value Date   WBC 21.6 (H) 04/23/2020   HGB 10.4 (L) 04/23/2020   HCT 30.8 (L) 04/23/2020   MCV 89.0 04/23/2020   PLT 113 (L) 04/23/2020   Lab Results  Component Value Date   NA 139 04/23/2020   K 3.4 (L) 04/23/2020   CO2 21 (L) 04/23/2020   GLUCOSE 83 04/23/2020   BUN 20 04/23/2020   CREATININE 0.73 04/23/2020   BILITOT 1.1 04/20/2020   ALKPHOS 70 04/20/2020   AST 32 04/20/2020   ALT 27 04/20/2020   PROT 7.5 04/20/2020   ALBUMIN 3.7 04/20/2020   CALCIUM 8.3 (L) 04/23/2020   ANIONGAP 11 04/23/2020   Lab Results  Component Value Date   CHOL 124 10/26/2017   Lab Results  Component Value Date   HDL 34 (L) 10/26/2017   Lab Results  Component Value Date   LDLCALC 66 10/26/2017   Lab Results  Component Value Date   TRIG 120 10/26/2017   Lab Results  Component Value Date   CHOLHDL 3.6 10/26/2017   Lab Results  Component Value Date   HGBA1C 5.5 02/20/2017      Assessment & Plan:   Problem List Items Addressed This Visit      Cardiovascular and Mediastinum   Hypertension     Genitourinary   Acute pyelonephritis - Primary   Relevant Medications   nystatin cream (MYCOSTATIN)   Other Relevant Orders   Ambulatory referral to Urology   Vaginal yeast infection   Relevant Medications   nystatin cream (MYCOSTATIN)    Other Visit Diagnoses    Bleeding nose        1. Acute pyelonephritis Continue antibiotic regimen as prescribed at discharge, continue proper hydration, ibuprofen as needed. - Ambulatory referral to Urology  2. Bleeding nose Encouraged patient to use saline spray, patient did have nasal  cannula during hospitalization  3.  Vaginal yeast infection Trial nystatin cream, patient did have catheter during hospitalization - nystatin cream (MYCOSTATIN); Apply 1 application topically 2 (two) times daily.  Dispense: 30 g; Refill: 0  4. Primary hypertension Encourage patient to continue checking blood pressure on a daily basis, keep a written log and have that available for all office visits.     I have reviewed the patient's medical history (PMH, PSH, Social History, Family History, Medications, and allergies) , and have been updated if relevant. I spent 30 minutes reviewing chart and  face to face time with patient.     Meds ordered this encounter  Medications  . nystatin cream (MYCOSTATIN)    Sig: Apply 1 application topically 2 (two) times daily.    Dispense:  30 g    Refill:  0    Order Specific Question:   Supervising Provider    Answer:   Storm Frisk [[id number]]    Follow-up: Return if symptoms worsen or fail to improve.    Kasandra Knudsen Mayers, PA-C

## 2020-04-26 NOTE — Patient Instructions (Signed)
I encourage you to use an over-the-counter saline spray to help resolve the nosebleeds.  I have sent a cream to help you with your vaginal itching.  I have started an urgent referral for you to be seen by urology for hospital follow-up  I encourage you to check your blood pressure, keep a written log, please let us know if your blood pressures are elevated, you can follow back up with [place] if need be prior to your visit with Dr. Alvis Lemmings.  I hope that you feel better soon  Roney Jaffe, PA-C Physician Assistant [facility] [email or web address]   Nosebleed, Adult A nosebleed is when blood comes out of the nose. Nosebleeds are common. Usually, they are not a sign of a serious condition. Nosebleeds can happen if a blood vessel in your nose starts to bleed or if the lining of your nose (mucous membrane) cracks. They are commonly caused by:  Allergies.  Colds.  Picking your nose.  Blowing your nose too hard.  An injury from sticking an object into your nose or getting hit in the nose.  Dry or cold air. Less common causes of nosebleeds include:  Toxic fumes.  Something abnormal in the nose or in the air-filled spaces in the bones of the face (sinuses).  Growths in the nose, such as polyps.  Blood thinners or conditions that cause blood to clot slowly.  Certain illnesses or procedures that irritate or dry out the nasal passages. Follow these instructions at home: When you have a nosebleed:  Sit down and tilt your head slightly forward.  Use a clean towel or tissue to pinch your nostrils under the bony part of your nose. After 5 minutes, let go of your nose and see if bleeding starts again. Do not release pressure before that time. If there is still bleeding, repeat the pinching and holding for 5 minutes or until the bleeding stops.  Do not place tissues or gauze in the nose to stop the bleeding.  Avoid lying down and  avoid tilting your head backward. That may make blood collect in the throat and cause gagging or coughing.  Use a nasal spray decongestant to help with a nosebleed as told by your health care provider.   After a nosebleed:  Avoid blowing your nose or sniffing for a number of hours.  Avoid straining, lifting, or bending at the waist for several days. You may go back to other normal activities as you are able.  If you are taking aspirin or blood thinners and you have nosebleeds, talk to your health care provider. These medicines make bleeding more likely. ? Ask your health care provider if you should stop taking the medicines or if you should adjust the dose. ? Do not stop taking medicines that your health care provider has recommended unless he or she tells you to stop taking them.  If your nosebleed was caused by dry mucous membranes, use over-the-counter saline nasal spray or gel and a humidifier as told by your health care provider. This will keep the mucous membranes moist and allow them to heal. If you need to use one of these products: ? Choose one that is water-soluble. ? Use only as much as you need and use it only as often as needed. ? Do not lie down right after you use it.  If you get nosebleeds often, talk with your health care provider about medical treatments. Options may include: ? Nasal cautery. This treatment stops and prevents  nosebleeds by using a chemical swab or electrical device to lightly burn tiny blood vessels inside the nose. ? Nasal packing. A gauze or other material is placed in the nose to keep constant pressure on the bleeding area. Contact a health care provider if you:  Have a fever.  Get nosebleeds often or more often than usual.  Bruise very easily.  Have a nosebleed from having something stuck in your nose.  Have bleeding in your mouth.  Vomit or cough up brown material.  Have a nosebleed after you start a new medicine. Get help right away  if:  You have a nosebleed after a fall or a head injury.  Your nosebleed does not go away after 20 minutes.  You feel dizzy or weak.  You have unusual bleeding from other parts of your body.  You have unusual bruising on other parts of your body.  You become sweaty.  You vomit blood. Summary  A nosebleed is when blood comes out of the nose. Common causes include allergies, an injury to the nose, or cold or dry air.  Initial treatment includes applying pressure for 5 minutes.  Moisturizing the nose with saline nasal spray or gel after a nosebleed may help prevent future bleeding.  Get help right away if your nosebleed does not go away after 20 minutes. This information is not intended to replace advice given to you by your health care provider. Make sure you discuss any questions you have with your health care provider. Document Revised: 12/30/2018 Document Reviewed: 12/30/2018 Elsevier Patient Education  2021 [employer].

## 2020-04-29 ENCOUNTER — Encounter: Payer: Self-pay | Admitting: Physician Assistant

## 2020-04-30 DIAGNOSIS — N201 Calculus of ureter: Secondary | ICD-10-CM | POA: Diagnosis not present

## 2020-05-01 ENCOUNTER — Other Ambulatory Visit: Payer: Self-pay | Admitting: Family Medicine

## 2020-05-01 DIAGNOSIS — I10 Essential (primary) hypertension: Secondary | ICD-10-CM

## 2020-05-01 NOTE — Telephone Encounter (Signed)
Requested medication (s) are due for refill today: no  Requested medication (s) are on the active medication list: no  Last refill:  03/30/20  Future visit scheduled: no  Notes to clinic: d/c'd 04/23/20 by Dr Lewie Chamber    Requested Prescriptions  Pending Prescriptions Disp Refills   irbesartan (AVAPRO) 150 MG tablet [Pharmacy Med Name: IRBESARTAN 150 MG TABLET 150 Tablet] 30 tablet 1    Sig: Take 1 tablet (150 mg total) by mouth daily.      Cardiovascular:  Angiotensin Receptor Blockers Failed - 05/01/2020  8:41 AM      Failed - K in normal range and within 180 days    Potassium  Date Value Ref Range Status  04/23/2020 3.4 (L) 3.5 - 5.1 mmol/L Final          Failed - Valid encounter within last 6 months    Recent Outpatient Visits           7 months ago Varicose veins of bilateral lower extremities with other complications   [facility] Hoy Register, MD   11 months ago Essential hypertension   [facility] [place], [place], [place]   1 year ago Rash   [facility] [place], [place], [place]   2 years ago Essential hypertension    [facility] [place], Odette Horns, [place]   2 years ago Acute cystitis with hematuria   [facility] [place], Marylene Land M, [place]                Passed - Cr in normal range and within 180 days    Creat  Date Value Ref Range Status  02/25/2016 0.56 0.50 - 1.10 mg/dL Final   Creatinine, Ser  Date Value Ref Range Status  04/23/2020 0.73 0.44 - 1.00 mg/dL Final          Passed - Patient is not pregnant      Passed - Last BP in normal range    BP Readings from Last 1 Encounters:  04/26/20 106/71

## 2020-05-02 ENCOUNTER — Other Ambulatory Visit: Payer: Self-pay | Admitting: Urology

## 2020-05-03 ENCOUNTER — Encounter (HOSPITAL_BASED_OUTPATIENT_CLINIC_OR_DEPARTMENT_OTHER): Payer: Self-pay | Admitting: Urology

## 2020-05-03 ENCOUNTER — Other Ambulatory Visit: Payer: Self-pay

## 2020-05-03 NOTE — Progress Notes (Addendum)
Spoke w/ via phone for pre-op interview--- PT Lab needs dos----   Istat and Urine preg            Lab results------ current ekg in epic/ chart COVID test ------ 05-04-2020 @ 0835 Arrive at ------- 0530 on 05-08-2020 NPO after MN NO Solid Food.  Clear liquids from MN until--- 0430 Medications to take morning of surgery ----- Norvasc, Protonix Diabetic medication ----- n/a Patient Special Instructions ----- n/a Pre-Op special Istructions ----- n/a Patient verbalized understanding of instructions that were given at this phone interview. Patient denies shortness of breath, chest pain, fever, cough at this phone interview.

## 2020-05-04 ENCOUNTER — Other Ambulatory Visit (HOSPITAL_COMMUNITY): Payer: Self-pay

## 2020-05-05 ENCOUNTER — Inpatient Hospital Stay (HOSPITAL_COMMUNITY): Admission: RE | Admit: 2020-05-05 | Payer: Self-pay | Source: Ambulatory Visit

## 2020-05-07 ENCOUNTER — Other Ambulatory Visit (HOSPITAL_COMMUNITY)
Admission: RE | Admit: 2020-05-07 | Discharge: 2020-05-07 | Disposition: A | Discharge status: Home / Self Care | Payer: Medicaid Other | Source: Ambulatory Visit | Attending: Urology | Admitting: Urology

## 2020-05-07 DIAGNOSIS — Z20822 Contact with and (suspected) exposure to covid-19: Secondary | ICD-10-CM | POA: Insufficient documentation

## 2020-05-07 DIAGNOSIS — Z01812 Encounter for preprocedural laboratory examination: Secondary | ICD-10-CM | POA: Diagnosis not present

## 2020-05-07 LAB — SARS CORONAVIRUS 2 (TAT 6-24 HRS): SARS Coronavirus 2: NEGATIVE

## 2020-05-07 NOTE — H&P (Signed)
: Patient is a 40 year old African American female who presented with acute onset of left-sided flank pain to the emergency room on 04/20/2020. She was found to have a 5 mm left proximal ureteral calculus with associated obstruction. Patient was uroseptic and required urgent placement of stent on 04/20/2020. She required hospitalization with IV pressors and IV a band antibiotics. Subsequently resolved with supportive management and was discharged home on oral antibiotics. Now doing well and here for follow-up.  KUB is performed today and reviewed. Left JJ stent is in good position. I did not see an obvious calcification overlying the left renal shadow or along the course of the left ureteral stent.     ALLERGIES: No Allergies    MEDICATIONS: Hydroxyzine Hcl  Irbesartan 150 mg tablet  Pantoprazole Sodium 40 mg tablet, delayed release     GU PSH: Cystoscopy Insert Stent, Left - 04/20/2020     NON-GU PSH: Cesarean Delivery Cholecystectomy (laparoscopic)     GU PMH: None   NON-GU PMH: Anxiety Depression GERD Hypertension    FAMILY HISTORY: No Family History    SOCIAL HISTORY: Marital Status: Single Preferred Language: English; Ethnicity: Not Hispanic Or Latino; Race: Black or African American Current Smoking Status: Patient has never smoked.   Tobacco Use Assessment Completed: Used Tobacco in last 30 days? Has never drank.  Does not drink caffeine.    REVIEW OF SYSTEMS:    GU Review Female:   Patient denies frequent urination, hard to postpone urination, burning /pain with urination, get up at night to urinate, leakage of urine, stream starts and stops, trouble starting your stream, have to strain to urinate, and being pregnant.  Gastrointestinal (Upper):   Patient denies nausea, vomiting, and indigestion/ heartburn.  Gastrointestinal (Lower):   Patient denies diarrhea and constipation.  Constitutional:   Patient denies fever, night sweats, weight loss, and fatigue.  Skin:    Patient denies skin rash/ lesion and itching.  Eyes:   Patient denies blurred vision and double vision.  Ears/ Nose/ Throat:   Patient denies sore throat and sinus problems.  Hematologic/Lymphatic:   Patient denies swollen glands and easy bruising.  Cardiovascular:   Patient denies leg swelling and chest pains.  Respiratory:   Patient denies cough and shortness of breath.  Endocrine:   Patient denies excessive thirst.  Musculoskeletal:   Patient denies back pain and joint pain.  Neurological:   Patient denies headaches and dizziness.  Psychologic:   Patient denies depression and anxiety.   VITAL SIGNS:      04/30/2020 01:42 PM  Weight 242 lb / 109.77 kg  Height 71 in / 180.34 cm  BP 107/71 mmHg  Pulse 74 /min  Temperature 98.0 F / 36.6 C  BMI 33.7 kg/m   GU PHYSICAL EXAMINATION:    Breast: Symmetrical. No tenderness, no nipple discharge, no skin changes. No mass.  Digital Rectal Exam: Normal sphincter tone. No rectal mass.  External Genitalia: No hirsutism, no rash, no scarring, no cyst, no erythematous lesion, no papular lesion, no blanched lesion, no warty lesion. No edema.  Urethral Meatus: Normal size. Normal position. No discharge.  Urethra: No tenderness, no mass, no scarring. No hypermobility. No leakage.  Bladder: Normal to palpation, no tenderness, no mass, normal size.  Vagina: No atrophy, no stenosis. No rectocele. No cystocele. No enterocele.  Cervix: No inflammation, no discharge, no lesion, no tenderness, no wart.  Uterus: Normal size. Normal consistency. Normal position. No mobility. No descent.  Adnexa / Parametria: No tenderness. No  adnexal mass. Normal left ovary. Normal right ovary.  Anus and Perineum: No hemorrhoids. No anal stenosis. No rectal fissure, no anal fissure. No edema, no dimple, no perineal tenderness, no anal tenderness.   MULTI-SYSTEM PHYSICAL EXAMINATION:    Constitutional: Well-nourished. No physical deformities. Normally developed. Good  grooming.  Neck: Neck symmetrical, not swollen. Normal tracheal position.  Respiratory: No labored breathing, no use of accessory muscles.   Cardiovascular: Normal temperature, normal extremity pulses, no swelling, no varicosities.  Lymphatic: No enlargement of neck, axillae, groin.  Skin: No paleness, no jaundice, no cyanosis. No lesion, no ulcer, no rash.  Neurologic / Psychiatric: Oriented to time, oriented to place, oriented to person. No depression, no anxiety, no agitation.  Gastrointestinal: No mass, no tenderness, no rigidity, non obese abdomen.  Eyes: Normal conjunctivae. Normal eyelids.  Ears, Nose, Mouth, and Throat: Left ear no scars, no lesions, no masses. Right ear no scars, no lesions, no masses. Nose no scars, no lesions, no masses. Normal hearing. Normal lips.  Musculoskeletal: Normal gait and station of head and neck.     Complexity of Data:  Source Of History:  Patient  Records Review:   Previous Doctor Records, Previous Hospital Records, Previous Patient Records  Urine Test Review:   Urinalysis   PROCEDURES:         KUB - 29528  A single view of the abdomen is obtained.      . Patient confirmed No Neulasta OnPro Device.           Urinalysis w/Scope Dipstick Dipstick Cont'd Micro  Color: Straw Bilirubin: Neg mg/dL WBC/hpf: 0 - 5/hpf  Appearance: Clear Ketones: Neg mg/dL RBC/hpf: 0 - 2/hpf  Specific Gravity: 1.010 Blood: Trace ery/uL Bacteria: Rare (0-9/hpf)  pH: 6.0 Protein: Neg mg/dL Cystals: NS (Not Seen)  Glucose: Neg mg/dL Urobilinogen: 0.2 mg/dL Casts: NS (Not Seen)    Nitrites: Neg Trichomonas: Not Present    Leukocyte Esterase: Neg leu/uL Mucous: Not Present      Epithelial Cells: 0 - 5/hpf      Yeast: NS (Not Seen)      Sperm: Not Present    ASSESSMENT:      ICD-10 Details  1 GU:   Ureteral calculus - N20.1 Acute, Complicated Injury   PLAN:           Orders X-Rays: KUB          Schedule         Document Letter(s):  Created for Patient:  Clinical Summary         Notes:   I discussed treatment options with the patient. As the stone is not clearly visible on KUB. Recommended cysto with left retrograde and left ureteroscopy with possible laser lithotripsy. It is possible that the stent placement could of caused break up of the stone and she could have passed it while in the hospital but I think since she had significant urosepsis requiring pressor support would not pull the stent without making sure stone is gone. Will schedule for cysto retrograde left ureteroscopy in the near future. Risks and benefits discussed as outlined below.  I have recommended retrograde pyelogram, ureteroscopic stone manipulation with laser lithotripsy. I have discussed in detail the risks, benefits and alternatives of ureteroscopic stone extraction to include but not limited to: Bleeding, infection, ureteral perforation with need for open repair, inability to place the stent necessitating the need for further procedures, possible percutaneous nephrostomy tube placement, discomfort from the stents, hematuria, urgency, frequency and refractory  problems after the stent is removed. I discussed the stent is not a permanent stent and will require a followup for stent removal or stent exchange. The patient knows there is high risk for ureteral stent incrustation if this is not removed or exchanged within 3 months. Patient voices understanding of the risks and benefits of the procedure and consents to the procedure.

## 2020-05-08 ENCOUNTER — Ambulatory Visit (HOSPITAL_BASED_OUTPATIENT_CLINIC_OR_DEPARTMENT_OTHER): Payer: Medicaid Other | Admitting: Certified Registered"

## 2020-05-08 ENCOUNTER — Ambulatory Visit (HOSPITAL_BASED_OUTPATIENT_CLINIC_OR_DEPARTMENT_OTHER)
Admission: RE | Admit: 2020-05-08 | Discharge: 2020-05-08 | Disposition: A | Discharge status: Home / Self Care | Payer: Medicaid Other | Attending: Urology | Admitting: Urology

## 2020-05-08 ENCOUNTER — Encounter (HOSPITAL_BASED_OUTPATIENT_CLINIC_OR_DEPARTMENT_OTHER)
Admission: RE | Disposition: A | Discharge status: Home / Self Care | Payer: Self-pay | Source: Home / Self Care | Attending: Urology

## 2020-05-08 ENCOUNTER — Encounter (HOSPITAL_BASED_OUTPATIENT_CLINIC_OR_DEPARTMENT_OTHER): Payer: Self-pay | Admitting: Urology

## 2020-05-08 ENCOUNTER — Other Ambulatory Visit: Payer: Self-pay

## 2020-05-08 DIAGNOSIS — I10 Essential (primary) hypertension: Secondary | ICD-10-CM | POA: Diagnosis not present

## 2020-05-08 DIAGNOSIS — K219 Gastro-esophageal reflux disease without esophagitis: Secondary | ICD-10-CM | POA: Diagnosis not present

## 2020-05-08 DIAGNOSIS — N200 Calculus of kidney: Secondary | ICD-10-CM | POA: Insufficient documentation

## 2020-05-08 DIAGNOSIS — E876 Hypokalemia: Secondary | ICD-10-CM | POA: Diagnosis not present

## 2020-05-08 DIAGNOSIS — Z79899 Other long term (current) drug therapy: Secondary | ICD-10-CM | POA: Diagnosis not present

## 2020-05-08 HISTORY — PX: HOLMIUM LASER APPLICATION: SHX5852

## 2020-05-08 HISTORY — DX: Calculus of ureter: N20.1

## 2020-05-08 HISTORY — DX: Personal history of other diseases of the musculoskeletal system and connective tissue: Z87.39

## 2020-05-08 HISTORY — DX: Gastro-esophageal reflux disease without esophagitis: K21.9

## 2020-05-08 HISTORY — PX: CYSTOSCOPY WITH RETROGRADE PYELOGRAM, URETEROSCOPY AND STENT PLACEMENT: SHX5789

## 2020-05-08 LAB — POCT I-STAT, CHEM 8
BUN: 13 mg/dL (ref 6–20)
Calcium, Ion: 1.35 mmol/L (ref 1.15–1.40)
Chloride: 105 mmol/L (ref 98–111)
Creatinine, Ser: 0.5 mg/dL (ref 0.44–1.00)
Glucose, Bld: 93 mg/dL (ref 70–99)
HCT: 35 % — ABNORMAL LOW (ref 36.0–46.0)
Hemoglobin: 11.9 g/dL — ABNORMAL LOW (ref 12.0–15.0)
Potassium: 3.7 mmol/L (ref 3.5–5.1)
Sodium: 140 mmol/L (ref 135–145)
TCO2: 21 mmol/L — ABNORMAL LOW (ref 22–32)

## 2020-05-08 LAB — POCT PREGNANCY, URINE: Preg Test, Ur: NEGATIVE

## 2020-05-08 SURGERY — CYSTOURETEROSCOPY, WITH RETROGRADE PYELOGRAM AND STENT INSERTION
Anesthesia: General | Site: Renal | Laterality: Left

## 2020-05-08 MED ORDER — DEXAMETHASONE SODIUM PHOSPHATE 10 MG/ML IJ SOLN
INTRAMUSCULAR | Status: DC | PRN
  Administered 2020-05-08: 10 mg via INTRAVENOUS

## 2020-05-08 MED ORDER — LIDOCAINE HCL (PF) 2 % IJ SOLN
INTRAMUSCULAR | Status: AC
  Filled 2020-05-08: qty 5

## 2020-05-08 MED ORDER — IOHEXOL 300 MG/ML  SOLN
INTRAMUSCULAR | Status: DC | PRN
  Administered 2020-05-08: 16 mL

## 2020-05-08 MED ORDER — CEFAZOLIN SODIUM-DEXTROSE 2-4 GM/100ML-% IV SOLN: 2.0000 g | INTRAVENOUS | Status: DC

## 2020-05-08 MED ORDER — CEFAZOLIN SODIUM-DEXTROSE 2-4 GM/100ML-% IV SOLN
INTRAVENOUS | Status: AC
  Filled 2020-05-08: qty 100

## 2020-05-08 MED ORDER — LACTATED RINGERS IV SOLN: INTRAVENOUS | Status: DC

## 2020-05-08 MED ORDER — PROMETHAZINE HCL 25 MG/ML IJ SOLN: 6.2500 mg | INTRAMUSCULAR | Status: DC | PRN

## 2020-05-08 MED ORDER — ONDANSETRON HCL 4 MG/2ML IJ SOLN
INTRAMUSCULAR | Status: DC | PRN
  Administered 2020-05-08 (×2): 4 mg via INTRAVENOUS

## 2020-05-08 MED ORDER — TRAMADOL HCL 50 MG PO TABS: 50.0000 mg | ORAL_TABLET | Freq: Four times a day (QID) | ORAL | 0 refills | Status: DC | PRN

## 2020-05-08 MED ORDER — SODIUM CHLORIDE 0.9 % IR SOLN
Status: DC | PRN
  Administered 2020-05-08: 6000 mL

## 2020-05-08 MED ORDER — MIDAZOLAM HCL 5 MG/5ML IJ SOLN
INTRAMUSCULAR | Status: DC | PRN
  Administered 2020-05-08: 2 mg via INTRAVENOUS

## 2020-05-08 MED ORDER — FENTANYL CITRATE (PF) 100 MCG/2ML IJ SOLN: 25.0000 ug | INTRAMUSCULAR | Status: DC | PRN

## 2020-05-08 MED ORDER — FENTANYL CITRATE (PF) 100 MCG/2ML IJ SOLN
INTRAMUSCULAR | Status: AC
  Filled 2020-05-08: qty 2

## 2020-05-08 MED ORDER — KETOROLAC TROMETHAMINE 30 MG/ML IJ SOLN: 30.0000 mg | Freq: Once | INTRAMUSCULAR | Status: DC | PRN

## 2020-05-08 MED ORDER — KETOROLAC TROMETHAMINE 30 MG/ML IJ SOLN
INTRAMUSCULAR | Status: DC | PRN
  Administered 2020-05-08: 30 mg via INTRAVENOUS

## 2020-05-08 MED ORDER — PROPOFOL 10 MG/ML IV BOLUS
INTRAVENOUS | Status: DC | PRN
  Administered 2020-05-08: 70 mg via INTRAVENOUS
  Administered 2020-05-08: 200 mg via INTRAVENOUS

## 2020-05-08 MED ORDER — FENTANYL CITRATE (PF) 100 MCG/2ML IJ SOLN
INTRAMUSCULAR | Status: DC | PRN
  Administered 2020-05-08 (×2): 50 ug via INTRAVENOUS

## 2020-05-08 MED ORDER — LIDOCAINE 2% (20 MG/ML) 5 ML SYRINGE
INTRAMUSCULAR | Status: DC | PRN
  Administered 2020-05-08: 100 mg via INTRAVENOUS

## 2020-05-08 MED ORDER — PROPOFOL 10 MG/ML IV BOLUS
INTRAVENOUS | Status: AC
  Filled 2020-05-08: qty 40

## 2020-05-08 MED ORDER — MIDAZOLAM HCL 2 MG/2ML IJ SOLN
INTRAMUSCULAR | Status: AC
  Filled 2020-05-08: qty 2

## 2020-05-08 MED ORDER — DEXMEDETOMIDINE (PRECEDEX) IN NS 20 MCG/5ML (4 MCG/ML) IV SYRINGE
PREFILLED_SYRINGE | INTRAVENOUS | Status: DC | PRN
  Administered 2020-05-08 (×3): 4 ug via INTRAVENOUS
  Administered 2020-05-08: 8 ug via INTRAVENOUS

## 2020-05-08 MED FILL — PANTOPRAZOLE SOD DR 40 MG T: 40 | 30 days supply | Qty: 30 | Fill #5

## 2020-05-08 SURGICAL SUPPLY — 32 items
BAG DRAIN URO-CYSTO SKYTR STRL (DRAIN) ×2 IMPLANT
BAG DRN UROCATH (DRAIN) ×1
BASKET STONE 1.7 NGAGE (UROLOGICAL SUPPLIES) ×2 IMPLANT
BULB IRRIG PATHFIND (MISCELLANEOUS) ×2 IMPLANT
CATH URET 5FR 28IN OPEN ENDED (CATHETERS) ×2 IMPLANT
CLOTH BEACON ORANGE TIMEOUT ST (SAFETY) ×2 IMPLANT
EXTRACTOR STONE 1.7FRX115CM (UROLOGICAL SUPPLIES) IMPLANT
FIBER LASER FLEXIVA 365 (UROLOGICAL SUPPLIES) IMPLANT
GLOVE SURG ENC MOIS LTX SZ6.5 (GLOVE) ×4 IMPLANT
GLOVE SURG ENC MOIS LTX SZ7.5 (GLOVE) ×2 IMPLANT
GLOVE SURG UNDER POLY LF SZ7 (GLOVE) ×2 IMPLANT
GOWN STRL REUS W/ TWL LRG LVL3 (GOWN DISPOSABLE) ×1 IMPLANT
GOWN STRL REUS W/TWL LRG LVL3 (GOWN DISPOSABLE) ×4 IMPLANT
GOWN STRL REUS W/TWL XL LVL3 (GOWN DISPOSABLE) ×2 IMPLANT
GUIDEWIRE STR DUAL SENSOR (WIRE) ×2 IMPLANT
GUIDEWIRE ZIPWRE .038 STRAIGHT (WIRE) IMPLANT
INFUSOR MANOMETER BAG 3000ML (MISCELLANEOUS) ×2 IMPLANT
IV NS 1000ML (IV SOLUTION)
IV NS 1000ML BAXH (IV SOLUTION) IMPLANT
IV NS IRRIG 3000ML ARTHROMATIC (IV SOLUTION) ×4 IMPLANT
KIT TURNOVER CYSTO (KITS) ×2 IMPLANT
MANIFOLD NEPTUNE II (INSTRUMENTS) ×2 IMPLANT
NS IRRIG 500ML POUR BTL (IV SOLUTION) ×2 IMPLANT
PACK CYSTO (CUSTOM PROCEDURE TRAY) ×2 IMPLANT
SHEATH URET ACCESS 12FR/35CM (UROLOGICAL SUPPLIES) ×2 IMPLANT
STENT URET 6FRX26 CONTOUR (STENTS) ×2 IMPLANT
SYR 10ML LL (SYRINGE) ×2 IMPLANT
SYR 20ML LL LF (SYRINGE) ×2 IMPLANT
TRACTIP FLEXIVA PULS ID 200XHI (Laser) ×1 IMPLANT
TRACTIP FLEXIVA PULSE ID 200 (Laser) ×2
TUBE CONNECTING 12X1/4 (SUCTIONS) ×2 IMPLANT
TUBING UROLOGY SET (TUBING) IMPLANT

## 2020-05-08 NOTE — Anesthesia Postprocedure Evaluation (Signed)
Anesthesia Post Note  Patient: Melissa Cooley  Procedure(s) Performed: CYSTOSCOPY WITH RETROGRADE PYELOGRAM, URETEROSCOPY WITH LASER , AND STENT EXCHANGE (Left Renal) HOLMIUM LASER APPLICATION (Left Renal)     Patient location during evaluation: PACU Anesthesia Type: General Level of consciousness: awake and alert Pain management: pain level controlled Vital Signs Assessment: post-procedure vital signs reviewed and stable Respiratory status: spontaneous breathing, nonlabored ventilation, respiratory function stable and patient connected to nasal cannula oxygen Cardiovascular status: blood pressure returned to baseline and stable Postop Assessment: no apparent nausea or vomiting Anesthetic complications: no   No complications documented.  Last Vitals:  Vitals:   05/08/20 0845 05/08/20 0900  BP:  132/70  Pulse:    Resp: 16 (!) 22  Temp:    SpO2: 98% 100%    Last Pain:  Vitals:   05/08/20 0900  TempSrc:   PainSc: 0-No pain                 Brecken Dewoody S

## 2020-05-08 NOTE — Transfer of Care (Signed)
Immediate Anesthesia Transfer of Care Note  Patient: Melissa Cooley  Procedure(s) Performed: CYSTOSCOPY WITH RETROGRADE PYELOGRAM, URETEROSCOPY WITH LASER , AND STENT EXCHANGE (Left Renal) HOLMIUM LASER APPLICATION (Left Renal)  Patient Location: PACU  Anesthesia Type:General  Level of Consciousness: drowsy  Airway & Oxygen Therapy: Patient Spontanous Breathing and Patient connected to face mask oxygen  Post-op Assessment: Report given to RN and Post -op Vital signs reviewed and stable  Post vital signs: Reviewed and stable  Last Vitals:  Vitals Value Taken Time  BP 141/82 05/08/20 0830  Temp    Pulse 79 05/08/20 0831  Resp 18 05/08/20 0831  SpO2 95 % 05/08/20 0831  Vitals shown include unvalidated device data.  Last Pain:  Vitals:   05/08/20 0555  TempSrc: Oral  PainSc: 5       Patients Stated Pain Goal: 5 (05/08/20 0555)  Complications: No complications documented.

## 2020-05-08 NOTE — Anesthesia Preprocedure Evaluation (Signed)
Anesthesia Evaluation  Patient identified by MRN, date of birth, ID band Patient awake    Reviewed: Allergy & Precautions, H&P , NPO status , Patient's Chart, lab work & pertinent test results  Airway Mallampati: II  TM Distance: >3 FB Neck ROM: Full    Dental no notable dental hx.    Pulmonary neg pulmonary ROS, former smoker,    Pulmonary exam normal breath sounds clear to auscultation       Cardiovascular hypertension, Pt. on medications Normal cardiovascular exam Rhythm:Regular Rate:Normal     Neuro/Psych negative neurological ROS  negative psych ROS   GI/Hepatic negative GI ROS, Neg liver ROS,   Endo/Other  negative endocrine ROS  Renal/GU negative Renal ROS  negative genitourinary   Musculoskeletal negative musculoskeletal ROS (+)   Abdominal   Peds negative pediatric ROS (+)  Hematology negative hematology ROS (+)   Anesthesia Other Findings   Reproductive/Obstetrics negative OB ROS                             Anesthesia Physical Anesthesia Plan  ASA: II  Anesthesia Plan: General   Post-op Pain Management:    Induction: Intravenous  PONV Risk Score and Plan: 3 and Ondansetron, Dexamethasone, Midazolam and Treatment may vary due to age or medical condition  Airway Management Planned: LMA  Additional Equipment:   Intra-op Plan:   Post-operative Plan: Extubation in OR  Informed Consent: I have reviewed the patients History and Physical, chart, labs and discussed the procedure including the risks, benefits and alternatives for the proposed anesthesia with the patient or authorized representative who has indicated his/her understanding and acceptance.     Dental advisory given  Plan Discussed with: CRNA and Surgeon  Anesthesia Plan Comments:         Anesthesia Quick Evaluation

## 2020-05-08 NOTE — Discharge Instructions (Signed)
  Post Anesthesia Home Care Instructions  Activity: Get plenty of rest for the remainder of the day. A responsible individual must stay with you for 24 hours following the procedure.  For the next 24 hours, DO NOT: -Drive a car -Advertising copywriter -Drink alcoholic beverages -Take any medication unless instructed by your physician -Make any legal decisions or sign important papers.  Meals: Start with liquid foods such as gelatin or soup. Progress to regular foods as tolerated. Avoid greasy, spicy, heavy foods. If nausea and/or vomiting occur, drink only clear liquids until the nausea and/or vomiting subsides. Call your physician if vomiting continues.  Special Instructions/Symptoms: Your throat may feel dry or sore from the anesthesia or the breathing tube placed in your throat during surgery. If this causes discomfort, gargle with warm salt water. The discomfort should disappear within 24 hours.  If you had a scopolamine patch placed behind your ear for the management of post- operative nausea and/or vomiting:  1. The medication in the patch is effective for 72 hours, after which it should be removed.  Wrap patch in a tissue and discard in the trash. Wash hands thoroughly with soap and water. 2. You may remove the patch earlier than 72 hours if you experience unpleasant side effects which may include dry mouth, dizziness or visual disturbances. 3. Avoid touching the patch. Wash your hands with soap and water after contact with the patch.    CYSTOSCOPY HOME CARE INSTRUCTIONS  Activity: Rest for the remainder of the day.  Do not drive or operate equipment today.  You may resume normal activities in one to two days as instructed by your physician.   Meals: Drink plenty of liquids and eat light foods such as gelatin or soup this evening.  You may return to a normal meal plan tomorrow.  Return to Work: You may return to work in one to two days or as instructed by your physician.  Special  Instructions / Symptoms: Call your physician if any of these symptoms occur:   -persistent or heavy bleeding  -bleeding which continues after first few urination  -large blood clots that are difficult to pass  -urine stream diminishes or stops completely  -fever equal to or higher than 101 degrees Farenheit.  -cloudy urine with a strong, foul odor  -severe pain  Females should always wipe from front to back after elimination.  You may feel some burning pain when you urinate.  This should disappear with time.  Applying moist heat to the lower abdomen or a hot tub bath may help relieve the pain. \  Follow-Up / Date of Return Visit to Your Physician: as instructed Call for an appointment to arrange follow-up.

## 2020-05-08 NOTE — Interval H&P Note (Signed)
History and Physical Interval Note:  05/08/2020 7:11 AM  Vestal Virtue  has presented today for surgery, with the diagnosis of LEFT URETERAL CALCULUS.  The various methods of treatment have been discussed with the patient and family. After consideration of risks, benefits and other options for treatment, the patient has consented to  Procedure(s) with comments: CYSTOSCOPY WITH RETROGRADE PYELOGRAM, URETEROSCOPY AND STENT EXCHANGE (Left) - 1 HR HOLMIUM LASER APPLICATION (Left) as a surgical intervention.  The patient's history has been reviewed, patient examined, no change in status, stable for surgery.  I have reviewed the patient's chart and labs.  Questions were answered to the patient's satisfaction.     Belva Agee

## 2020-05-08 NOTE — Op Note (Addendum)
Preoperative diagnosis:  1.  Left renal calculus  Postoperative diagnosis: 1.  Left renal calculus  Procedure(s): 1.  Cystoscopy, leftt retrograde pyelogram with intraoperative interpretation, left ureteroscopy and pyeloscopy with holmium laser lithotripsy of left renal calculus, stone extraction, left JJ stent exchange  Surgeon: Dr. Karoline Caldwell  Anesthesia: General  Complications: None  EBL: Minimal  Specimens: Kidney stone  Disposition of specimens: Patient to bring back to office  Intraoperative findings: 6 mm left upper calyceal stone, fragmented and extracted, 6 French by 26 cm left JJ stent exchanged  Indication: 40 year old African-American female who presented approximately 2 weeks ago with urosepsis and left ureteral calculus.  She underwent urgent placement of Left stent.  Stent is not visualized well on follow-up imaging she presents at this time to go cystoscopy retrograde and ureteroscopy and possible pyeloscopy with laser lithotripsy of ureteral or renal calculus.  Description of procedure:  After obtaining informed consent for the patient she was taken the major cystoscopy suite placed under general anesthesia.  Placed in the dorsolithotomy position genitalia prepped and draped in usual sterile fashion.  Cystoscopy was carried out with 21 French cystoscope.  Bladder appeared grossly normal.  Left JJ stent was identified.  Utilizing alligator graspers this was removed without difficulty.  Utilizing 5 French open tip catheter retrograde pyelogram was performed revealed no obvious filling defect within the ureter questionable filling defect in the upper calyx of the left kidney.  A sensor wire was subsequently passed up to the renal pelvis under fluoroscopy without difficulty.  6.4 French semirigid ureteroscope was advanced over the guidewire inside the left ureteral orifice without difficulty.  Scope was advanced under direct vision along its length up to the UPJ area and  no stone was encountered.  The scope was removed leaving the guidewire in place.  A medium 15 French ureteral access sheath was then passed over the guidewire under fluoroscopy without difficulty.  The flexible ureteroscope was then passed through the access sheath and advanced up to the renal pelvis.  The kidney was systematically visualized from lower middle and upper calyx.  There were no stones noted in the lower or middle calyceal areas.  6 mm stone was encountered in the upper calyx of the left kidney.  Utilizing the 200 m holmium laser fiber at a rate of 0.6 and 6, the stone was fragmented into 3 smaller pieces which were then individually basketed utilizing an engage basket and removed with separate passes of the ureteroscope.  Final look in the kidney revealed no significant fragments remaining.  Retrograde pyelogram through the ureteroscope revealed good integrity of the collecting system with no evidence of extravasation.  Sensor wire was passed through the flexible scope and coiled in the upper pole calyx.  The flexible scope was then backed down off of the wire visualizing the ureter along its entirety as I removed the access sheath leaving the guidewire in place.  There were no stones noted within the ureter.  Wire was then back fed through the cystoscope and a 6 [place] by 26 cm Percuflex plus soft Contour stent was placed leaving a proximal coil in the renal pelvis and a distal coil in the bladder.  There is good flow of urine through and around the stent noted.  Bladder was emptied procedure terminated.  She was awakened from anesthesia and taken back to the recovery room in stable condition.  No immediate complication from the procedure.

## 2020-05-08 NOTE — Anesthesia Procedure Notes (Signed)
Procedure Name: LMA Insertion Date/Time: 05/08/2020 7:29 AM Performed by: Marny Lowenstein, CRNA Pre-anesthesia Checklist: Patient identified, Emergency Drugs available, Suction available and Patient being monitored Patient Re-evaluated:Patient Re-evaluated prior to induction Oxygen Delivery Method: Circle system utilized Induction Type: IV induction Ventilation: Mask ventilation without difficulty LMA: LMA inserted LMA Size: 4.0 Number of attempts: 1 Airway Equipment and Method: Patient positioned with wedge pillow Placement Confirmation: positive ETCO2 and breath sounds checked- equal and bilateral Tube secured with: Tape Dental Injury: Teeth and Oropharynx as per pre-operative assessment  Comments: Pt with chip to front right tooth prior to LMA placement; no additional chipped/loose dentition noted. All intact after LMA placement

## 2020-05-09 ENCOUNTER — Other Ambulatory Visit: Payer: Self-pay | Admitting: Family Medicine

## 2020-05-09 DIAGNOSIS — I10 Essential (primary) hypertension: Secondary | ICD-10-CM

## 2020-05-10 ENCOUNTER — Encounter (HOSPITAL_BASED_OUTPATIENT_CLINIC_OR_DEPARTMENT_OTHER): Payer: Self-pay | Admitting: Urology

## 2020-05-11 DIAGNOSIS — N201 Calculus of ureter: Secondary | ICD-10-CM | POA: Diagnosis not present

## 2020-05-13 IMAGING — CT CT HEAD WITHOUT CONTRAST
3 series · 16 of 47 positions shown, 19 images · non-contrast
Comparison: CT scan of October 28, 2017.

CLINICAL DATA: Headache, right-sided weakness.

EXAM:
CT HEAD WITHOUT CONTRAST
TECHNIQUE: Contiguous axial images were obtained from the base of the skull
through the vertex without intravenous contrast.

[Series 3: head 5.0 h30s · axial · 0.42mm/px · z∈[-103,+32]mm · 10 of 33 slices shown, 13 images]
[im 3/33  brain]
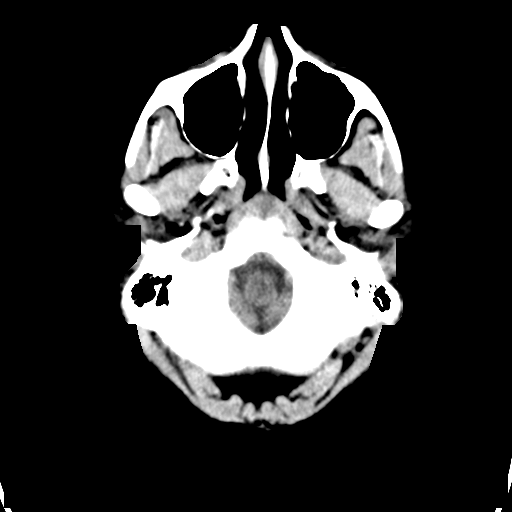
[im 3/33  bone]
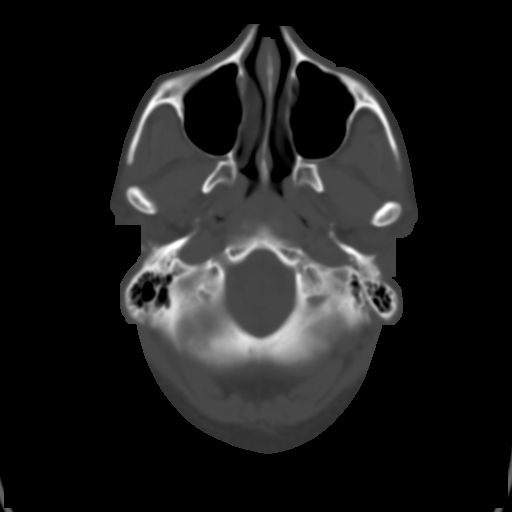
[im 6/33  brain]
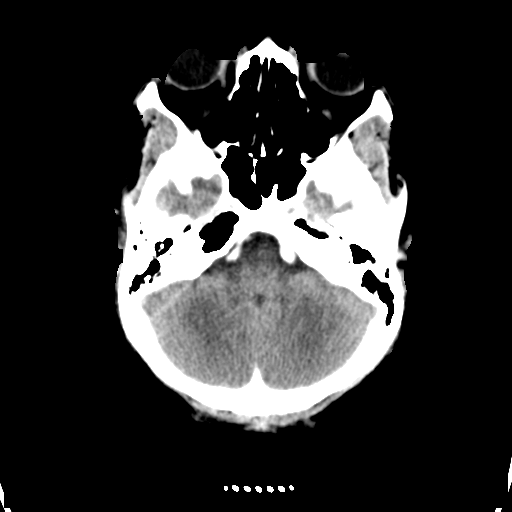
[im 9/33  brain]
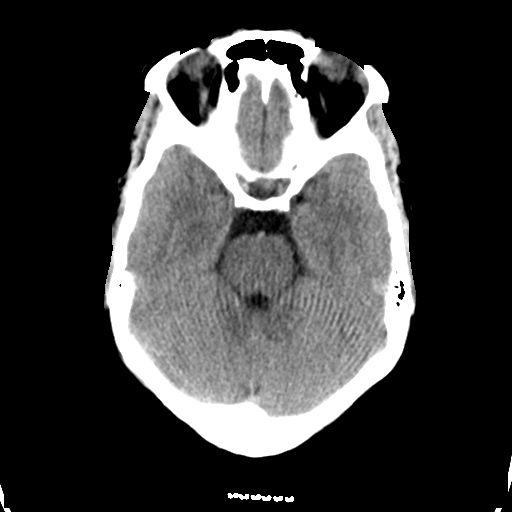
[im 12/33  brain]
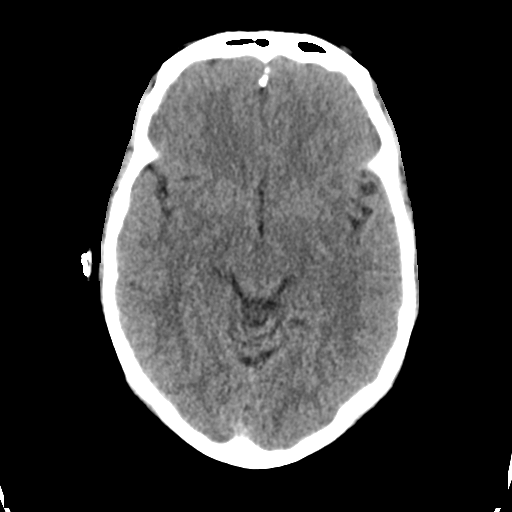
[im 15/33  brain]
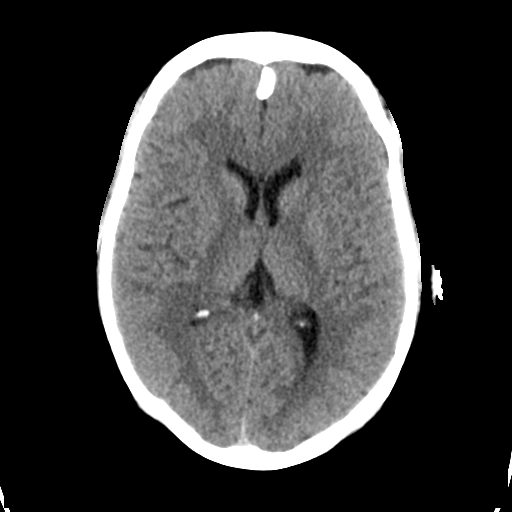
[im 15/33  bone]
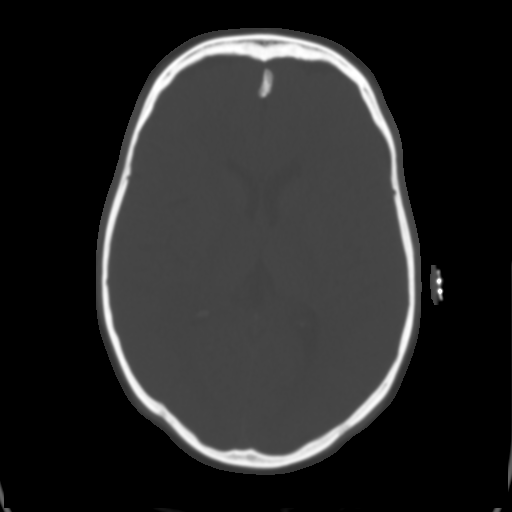
[im 18/33  brain]
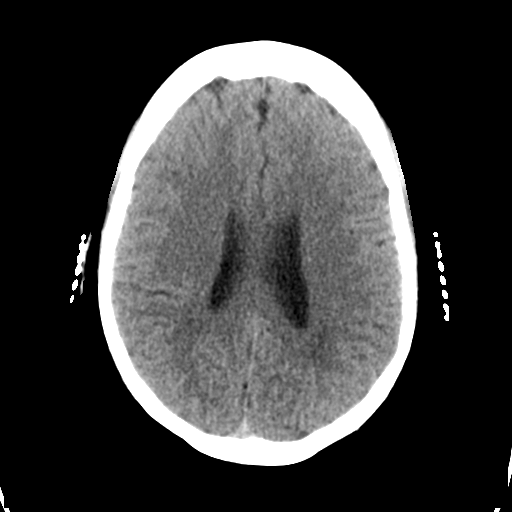
[im 21/33  brain]
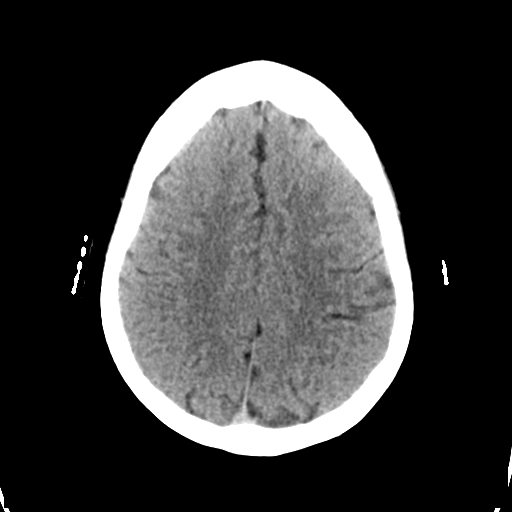
[im 25/33  brain]
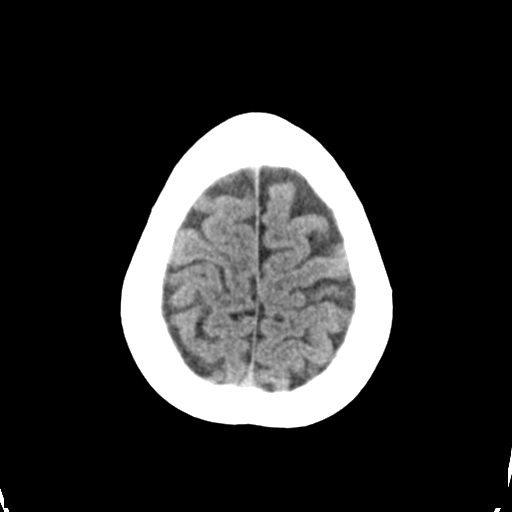
[im 27/33  brain]
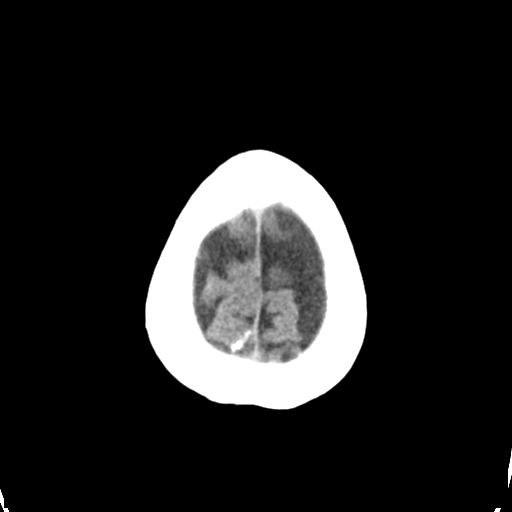
[im 27/33  bone]
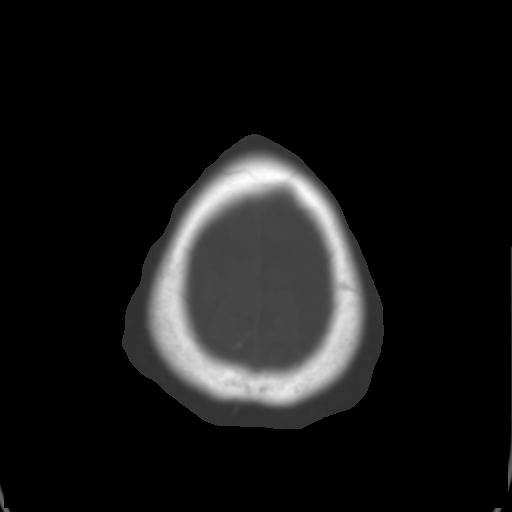
[im 30/33  brain]
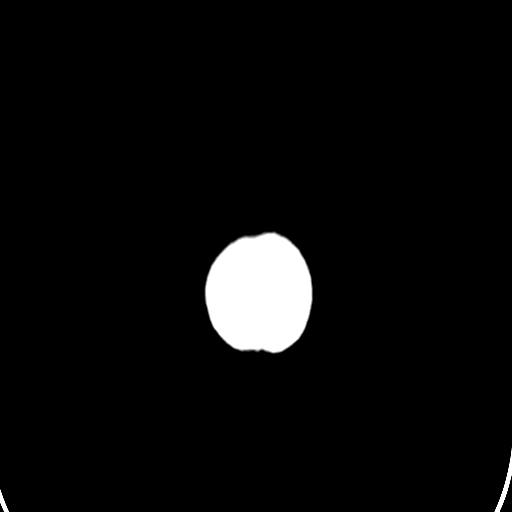

[Series 5: head 3.0 mpr cor · coronal · 0.32mm/px · 3 of 67 slices shown]
[im 23/67  brain]
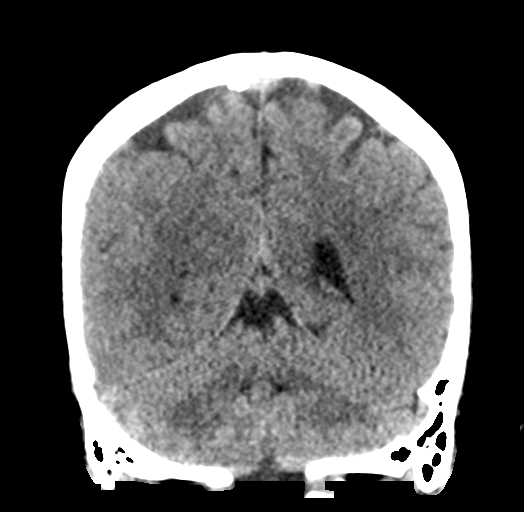
[im 30/67  brain]
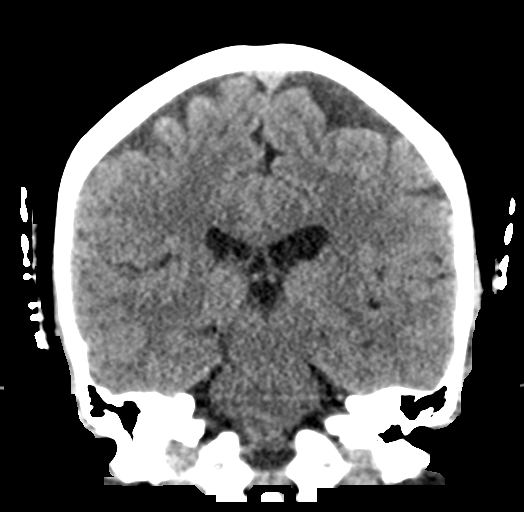
[im 37/67  brain]
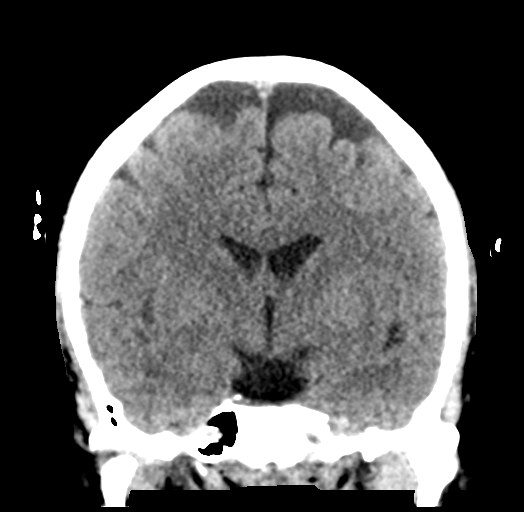

[Series 6: head 3.0 mpr sag · sagittal · 0.32mm/px · 3 of 67 slices shown]
[im 23/67  brain]
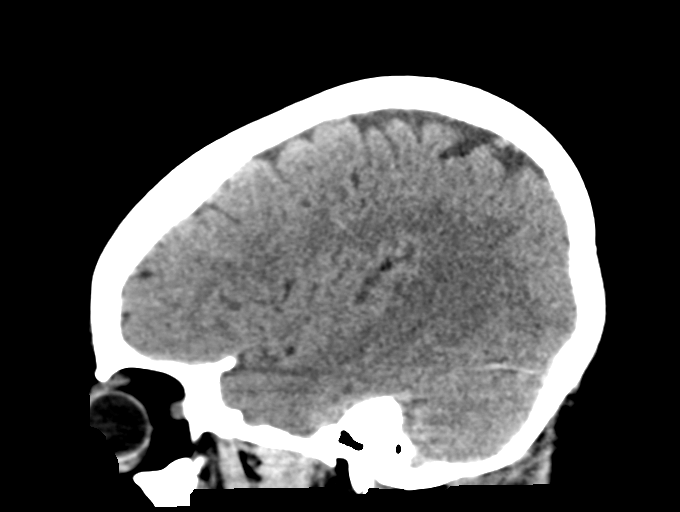
[im 34/67  brain]
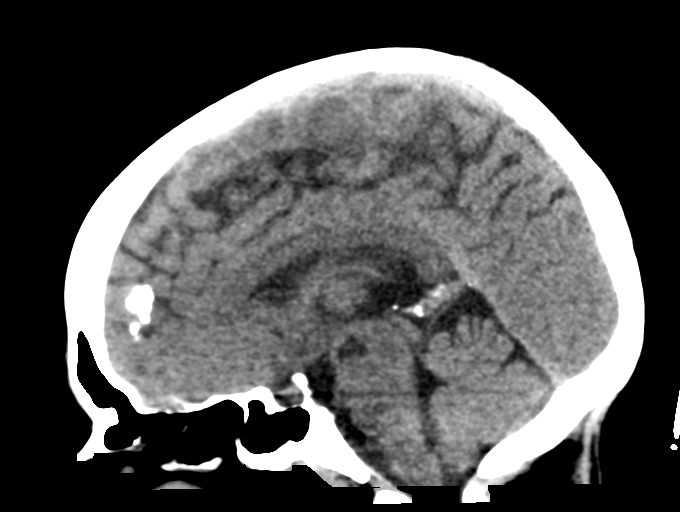
[im 45/67  brain]
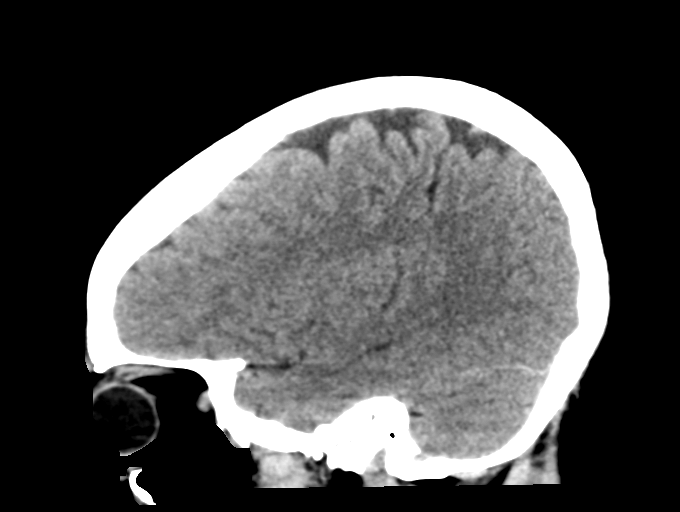

[16 of 47 positions shown; findings below may reference images not displayed]

FINDINGS: Brain: No evidence of acute infarction, hemorrhage, hydrocephalus,
extra-axial collection or mass lesion/mass effect.

Vascular: No hyperdense vessel or unexpected calcification.

Skull: Normal. Negative for fracture or focal lesion.

Sinuses/Orbits: No acute finding.

Other: None.
IMPRESSION: Normal head CT.

## 2020-05-25 MED FILL — AMLODIPINE BESYLATE 10 MG T: 10 | 30 days supply | Qty: 30 | Fill #1

## 2020-06-16 ENCOUNTER — Other Ambulatory Visit: Payer: Self-pay

## 2020-06-26 ENCOUNTER — Other Ambulatory Visit: Payer: Self-pay

## 2020-06-26 ENCOUNTER — Other Ambulatory Visit: Payer: Self-pay | Admitting: Family Medicine

## 2020-06-26 DIAGNOSIS — K219 Gastro-esophageal reflux disease without esophagitis: Secondary | ICD-10-CM

## 2020-06-26 MED FILL — Amlodipine Besylate Tab 10 MG (Base Equivalent): ORAL | 30 days supply | Qty: 30 | Fill #0 | Status: AC

## 2020-06-26 NOTE — Telephone Encounter (Signed)
   Notes to clinic: review for refill Looks like it was d/c because it was reorder    Requested Prescriptions  Pending Prescriptions Disp Refills   pantoprazole (PROTONIX) 40 MG tablet 90 tablet 1    Sig: TAKE 1 TABLET (40 MG TOTAL) BY MOUTH DAILY.      Gastroenterology: Proton Pump Inhibitors Passed - 06/26/2020 10:33 AM      Passed - Valid encounter within last 12 months    Recent Outpatient Visits           9 months ago Varicose veins of bilateral lower extremities with other complications   [facility] Hoy Register, MD   1 year ago Essential hypertension   [facility] [place], Marzella Schlein, [place]   1 year ago Rash   [facility] Hoy Register, MD   2 years ago Essential hypertension   [facility] Hoy Register, [place]   2 years ago Acute cystitis with hematuria   [facility] [place], [place], [place]

## 2020-07-03 DIAGNOSIS — M79672 Pain in left foot: Secondary | ICD-10-CM | POA: Diagnosis not present

## 2020-07-11 ENCOUNTER — Other Ambulatory Visit: Payer: Self-pay

## 2020-07-25 ENCOUNTER — Other Ambulatory Visit: Payer: Self-pay

## 2020-07-25 MED FILL — Amlodipine Besylate Tab 10 MG (Base Equivalent): ORAL | 30 days supply | Qty: 30 | Fill #1 | Status: AC

## 2020-08-21 DIAGNOSIS — Z7251 High risk heterosexual behavior: Secondary | ICD-10-CM | POA: Diagnosis not present

## 2020-08-21 DIAGNOSIS — Z87442 Personal history of urinary calculi: Secondary | ICD-10-CM | POA: Diagnosis not present

## 2020-08-27 ENCOUNTER — Other Ambulatory Visit: Payer: Self-pay | Admitting: Internal Medicine

## 2020-09-03 ENCOUNTER — Other Ambulatory Visit: Payer: Self-pay

## 2020-09-04 DIAGNOSIS — R12 Heartburn: Secondary | ICD-10-CM | POA: Diagnosis not present

## 2020-09-04 DIAGNOSIS — I10 Essential (primary) hypertension: Secondary | ICD-10-CM | POA: Diagnosis not present

## 2020-09-04 DIAGNOSIS — R102 Pelvic and perineal pain: Secondary | ICD-10-CM | POA: Diagnosis not present

## 2020-09-04 DIAGNOSIS — F32A Depression, unspecified: Secondary | ICD-10-CM | POA: Diagnosis not present

## 2020-10-09 DIAGNOSIS — Z20822 Contact with and (suspected) exposure to covid-19: Secondary | ICD-10-CM | POA: Diagnosis not present

## 2020-10-19 DIAGNOSIS — Z20822 Contact with and (suspected) exposure to covid-19: Secondary | ICD-10-CM | POA: Diagnosis not present

## 2020-11-08 DIAGNOSIS — Z20822 Contact with and (suspected) exposure to covid-19: Secondary | ICD-10-CM | POA: Diagnosis not present

## 2020-11-19 DIAGNOSIS — R030 Elevated blood-pressure reading, without diagnosis of hypertension: Secondary | ICD-10-CM | POA: Diagnosis not present

## 2020-11-19 DIAGNOSIS — R399 Unspecified symptoms and signs involving the genitourinary system: Secondary | ICD-10-CM | POA: Diagnosis not present

## 2020-11-19 DIAGNOSIS — B373 Candidiasis of vulva and vagina: Secondary | ICD-10-CM | POA: Diagnosis not present

## 2020-11-19 DIAGNOSIS — N926 Irregular menstruation, unspecified: Secondary | ICD-10-CM | POA: Diagnosis not present

## 2020-11-19 DIAGNOSIS — Z6835 Body mass index (BMI) 35.0-35.9, adult: Secondary | ICD-10-CM | POA: Diagnosis not present

## 2020-11-19 DIAGNOSIS — R300 Dysuria: Secondary | ICD-10-CM | POA: Diagnosis not present

## 2020-11-29 ENCOUNTER — Other Ambulatory Visit: Payer: Self-pay | Admitting: Physician Assistant

## 2020-11-29 DIAGNOSIS — Z1231 Encounter for screening mammogram for malignant neoplasm of breast: Secondary | ICD-10-CM

## 2020-12-06 ENCOUNTER — Ambulatory Visit
Admission: RE | Admit: 2020-12-06 | Discharge: 2020-12-06 | Disposition: A | Discharge status: Home / Self Care | Payer: Medicaid Other | Source: Ambulatory Visit | Attending: Physician Assistant | Admitting: Physician Assistant

## 2020-12-06 ENCOUNTER — Other Ambulatory Visit: Payer: Self-pay

## 2020-12-06 DIAGNOSIS — Z1231 Encounter for screening mammogram for malignant neoplasm of breast: Secondary | ICD-10-CM

## 2020-12-24 DIAGNOSIS — Z87442 Personal history of urinary calculi: Secondary | ICD-10-CM | POA: Diagnosis not present

## 2021-02-06 ENCOUNTER — Other Ambulatory Visit: Payer: Self-pay

## 2021-02-17 DIAGNOSIS — H5213 Myopia, bilateral: Secondary | ICD-10-CM | POA: Diagnosis not present

## 2021-03-07 DIAGNOSIS — K219 Gastro-esophageal reflux disease without esophagitis: Secondary | ICD-10-CM | POA: Diagnosis not present

## 2021-03-07 DIAGNOSIS — Z1152 Encounter for screening for COVID-19: Secondary | ICD-10-CM | POA: Diagnosis not present

## 2021-03-07 DIAGNOSIS — J069 Acute upper respiratory infection, unspecified: Secondary | ICD-10-CM | POA: Diagnosis not present

## 2021-03-07 DIAGNOSIS — Z6835 Body mass index (BMI) 35.0-35.9, adult: Secondary | ICD-10-CM | POA: Diagnosis not present

## 2021-03-15 ENCOUNTER — Ambulatory Visit: Payer: Medicaid Other | Admitting: Nurse Practitioner

## 2021-03-15 ENCOUNTER — Other Ambulatory Visit: Payer: Self-pay

## 2021-03-15 ENCOUNTER — Other Ambulatory Visit (HOSPITAL_COMMUNITY)
Admission: RE | Admit: 2021-03-15 | Discharge: 2021-03-15 | Disposition: A | Discharge status: Home / Self Care | Payer: Medicaid Other | Source: Ambulatory Visit | Attending: Nurse Practitioner | Admitting: Nurse Practitioner

## 2021-03-15 VITALS — BP 130/76 | HR 75 | Ht 71.0 in | Wt 261.0 lb

## 2021-03-15 DIAGNOSIS — F32A Depression, unspecified: Secondary | ICD-10-CM | POA: Diagnosis not present

## 2021-03-15 DIAGNOSIS — Z113 Encounter for screening for infections with a predominantly sexual mode of transmission: Secondary | ICD-10-CM | POA: Diagnosis not present

## 2021-03-15 DIAGNOSIS — Z01419 Encounter for gynecological examination (general) (routine) without abnormal findings: Secondary | ICD-10-CM

## 2021-03-15 NOTE — Progress Notes (Signed)
GYNECOLOGY ANNUAL PREVENTATIVE CARE ENCOUNTER NOTE  Subjective:   Melissa Cooley is a 40 y.o. [id number] female here for a routine annual gynecologic exam.  Current complaints: wants STD testing and has had several deaths of significant people in her life in the past year.   Would like to speak with a counselor.  Denies abnormal vaginal bleeding, discharge, pelvic pain, problems with intercourse or other gynecologic concerns.    Gynecologic History No LMP recorded. Contraception: tubal ligation Last Pap: 01-03-20. Results were: normal Last mammogram: Sept. 2022  Results were: normal  Obstetric History OB History  Gravida Para Term Preterm AB Living  5 2     3 2   SAB IAB Ectopic Multiple Live Births    3     2    # Outcome Date GA Lbr Len/2nd Weight Sex Delivery Anes PTL Lv  5 Para 10/01/09    M CS-LTranv  N LIV  4 Para 08/03/01    F CS-LTranv  Y LIV  3 IAB           2 IAB           1 IAB             Past Medical History:  Diagnosis Date   Anxiety    Depression    GERD (gastroesophageal reflux disease)    Gout    History of gout 2019  approx   History of septic shock 04/20/2020   hospital admission in epic;  acute pyenephritis due to uropathy obstruction from ureter calculus   Hypertension    followed by pcp   Left ureteral calculus     Past Surgical History:  Procedure Laterality Date   CESAREAN SECTION  x2   last one 2011   CYSTOSCOPY WITH RETROGRADE PYELOGRAM, URETEROSCOPY AND STENT PLACEMENT Left 04/20/2020   Procedure: CYSTOSCOPY WITH Left JJ STENT PLACEMENT;  Surgeon: 06/18/2020, MD;  Location: [facility];  Service: Urology;  Laterality: Left;   CYSTOSCOPY WITH RETROGRADE PYELOGRAM, URETEROSCOPY AND STENT PLACEMENT Left 05/08/2020   Procedure: CYSTOSCOPY WITH RETROGRADE PYELOGRAM, URETEROSCOPY WITH LASER , AND STENT EXCHANGE;  Surgeon: 05/10/2020, MD;  Location: [facility];  Service: Urology;  Laterality: Left;   HOLMIUM LASER APPLICATION  Left 05/08/2020   Procedure: HOLMIUM LASER APPLICATION;  Surgeon: 05/10/2020, MD;  Location: [facility];  Service: Urology;  Laterality: Left;   LAPAROSCOPIC CHOLECYSTECTOMY  2003   TUBAL LIGATION Bilateral 2017    Current Outpatient Medications on File Prior to Visit  Medication Sig Dispense Refill   amLODipine (NORVASC) 10 MG tablet TAKE 1 TABLET (10 MG TOTAL) BY MOUTH DAILY. 30 tablet 3   pantoprazole (PROTONIX) 40 MG tablet Take 40 mg by mouth daily.     [DISCONTINUED] hydrochlorothiazide (MICROZIDE) 12.5 MG capsule Take 1 capsule (12.5 mg total) by mouth daily. (Patient not taking: Reported on 04/20/2020) 90 capsule 1   [DISCONTINUED] irbesartan (AVAPRO) 150 MG tablet Take 1 tablet (150 mg total) by mouth daily. (Patient not taking: Reported on 04/20/2020) 90 tablet 1   No current facility-administered medications on file prior to visit.    Not on File  Social History   Socioeconomic History   Marital status: Single    Spouse name: Not on file   Number of children: Not on file   Years of education: Not on file   Highest education level: Not on file  Occupational History   Not on  file  Tobacco Use   Smoking status: Former    Years: 3.00    Types: Cigarettes    Quit date: 05/04/2003    Years since quitting: 17.8   Smokeless tobacco: Never  Vaping Use   Vaping Use: Never used  Substance and Sexual Activity   Alcohol use: No   Drug use: Never   Sexual activity: Yes    Birth control/protection: Surgical  Other Topics Concern   Not on file  Social History Narrative   Not on file   Social Determinants of Health   Financial Resource Strain: Not on file  Food Insecurity: Not on file  Transportation Needs: Not on file  Physical Activity: Not on file  Stress: Not on file  Social Connections: Not on file  Intimate Partner Violence: Not on file    Family History  Problem Relation Age of Onset   Cancer Mother    Drug abuse Mother    Bipolar  disorder Mother    Breast cancer Mother 10   Diabetes Father    Hypertension Other    Anxiety disorder Sister    Drug abuse Brother    Lupus Maternal Aunt     The following portions of the patient's history were reviewed and updated as appropriate: allergies, current medications, past family history, past medical history, past social history, past surgical history and problem list.  Review of Systems Pertinent items noted in HPI and remainder of comprehensive ROS otherwise negative.   Objective:  BP 130/76    Pulse 75    Ht 5\' 11"  (1.803 m)    Wt 261 lb (118.4 kg)    BMI 36.40 kg/m  CONSTITUTIONAL: Well-developed, well-nourished female in no acute distress.  HENT:  Normocephalic, atraumatic, External right and left ear normal.  EYES: Conjunctivae and EOM are normal. Pupils are equal, round.  No scleral icterus.  NECK: Normal range of motion, supple, no masses.  Normal thyroid.  SKIN: Skin is warm and dry. No rash noted. Not diaphoretic. No erythema. No pallor. NEUROLOGIC: Alert and oriented to person, place, and time. Normal reflexes, muscle tone coordination. No cranial nerve deficit noted. PSYCHIATRIC: Normal mood and affect. Normal behavior. Normal judgment and thought content. CARDIOVASCULAR: Normal heart rate noted, regular rhythm RESPIRATORY: Clear to auscultation bilaterally. Effort and breath sounds normal, no problems with respiration noted. BREASTS: Symmetric in size. No masses, skin changes, nipple drainage, or lymphadenopathy. ABDOMEN: Soft, no distention noted.  No tenderness, rebound or guarding.  PELVIC: Normal appearing external genitalia; normal appearing vaginal mucosa and cervix.  No abnormal discharge noted.  Pap smear obtained.  Normal uterine size, no other palpable masses, no uterine or adnexal tenderness. MUSCULOSKELETAL: Normal range of motion. No tenderness.  No cyanosis, clubbing, or edema.    Assessment and Plan:  1. Encounter for annual routine  gynecological examination Has tubal ligation Bleeds monthly for 4 days - pleased with bleeding profile Wants STD testing today Pap done last year - normal - not due today Had mammogram earlier this year Has PCP that she sees for her chronic medical conditions  - RPR - HIV Antibody (routine testing w rflx) - Hepatitis B Surface AntiGEN - Cervicovaginal ancillary only( Page)  2. Depression, unspecified depression type Has had several deaths this year of close family Finds she is having trouble with depression again Major coping mechanism is smoking Wants to see someone for help Additionally reports her main strategy of dealing with stress is everyday smoking - did not address  smoking cessation today.  - Ambulatory referral to Integrated Behavioral Health  3. Screen for STD (sexually transmitted disease)   Will follow up results of pap smear and manage accordingly. Mammogram done earlier this year Routine preventative health maintenance measures emphasized. Please refer to After Visit Summary for other counseling recommendations.    Nolene Bernheim, RN, MSN, NP-BC Nurse Practitioner, [facility]

## 2021-03-16 ENCOUNTER — Encounter: Payer: Self-pay | Admitting: Nurse Practitioner

## 2021-03-16 LAB — HEPATITIS B SURFACE ANTIGEN: Hepatitis B Surface Ag: NEGATIVE

## 2021-03-16 LAB — RPR: RPR Ser Ql: NONREACTIVE

## 2021-03-16 LAB — HIV ANTIBODY (ROUTINE TESTING W REFLEX): HIV Screen 4th Generation wRfx: NONREACTIVE

## 2021-03-19 LAB — CERVICOVAGINAL ANCILLARY ONLY
Chlamydia: NEGATIVE
Comment: NEGATIVE
Comment: NEGATIVE
Comment: NORMAL
Neisseria Gonorrhea: NEGATIVE
Trichomonas: NEGATIVE

## 2021-04-01 ENCOUNTER — Institutional Professional Consult (permissible substitution): Payer: Medicaid Other

## 2021-04-20 DIAGNOSIS — Z79899 Other long term (current) drug therapy: Secondary | ICD-10-CM | POA: Diagnosis not present

## 2021-04-20 DIAGNOSIS — R5383 Other fatigue: Secondary | ICD-10-CM | POA: Diagnosis not present

## 2021-04-20 DIAGNOSIS — E559 Vitamin D deficiency, unspecified: Secondary | ICD-10-CM | POA: Diagnosis not present

## 2021-07-15 DIAGNOSIS — R634 Abnormal weight loss: Secondary | ICD-10-CM | POA: Diagnosis not present

## 2021-07-15 DIAGNOSIS — Z6832 Body mass index (BMI) 32.0-32.9, adult: Secondary | ICD-10-CM | POA: Diagnosis not present

## 2021-07-15 DIAGNOSIS — I83813 Varicose veins of bilateral lower extremities with pain: Secondary | ICD-10-CM | POA: Diagnosis not present

## 2021-07-15 DIAGNOSIS — R002 Palpitations: Secondary | ICD-10-CM | POA: Diagnosis not present

## 2021-07-15 DIAGNOSIS — R030 Elevated blood-pressure reading, without diagnosis of hypertension: Secondary | ICD-10-CM | POA: Diagnosis not present

## 2021-07-31 ENCOUNTER — Ambulatory Visit (HOSPITAL_COMMUNITY)
Admission: EM | Admit: 2021-07-31 | Discharge: 2021-07-31 | Disposition: A | Discharge status: Home / Self Care | Payer: Medicaid Other | Attending: Internal Medicine | Admitting: Internal Medicine

## 2021-07-31 ENCOUNTER — Encounter (HOSPITAL_COMMUNITY): Payer: Self-pay

## 2021-07-31 DIAGNOSIS — N898 Other specified noninflammatory disorders of vagina: Secondary | ICD-10-CM

## 2021-07-31 DIAGNOSIS — R1084 Generalized abdominal pain: Secondary | ICD-10-CM | POA: Diagnosis not present

## 2021-07-31 DIAGNOSIS — B3731 Acute candidiasis of vulva and vagina: Secondary | ICD-10-CM

## 2021-07-31 DIAGNOSIS — R300 Dysuria: Secondary | ICD-10-CM | POA: Diagnosis not present

## 2021-07-31 LAB — POCT URINALYSIS DIPSTICK, ED / UC
Bilirubin Urine: NEGATIVE
Glucose, UA: NEGATIVE mg/dL
Ketones, ur: NEGATIVE mg/dL
Leukocytes,Ua: NEGATIVE
Nitrite: NEGATIVE
Protein, ur: 30 mg/dL — AB
Specific Gravity, Urine: 1.02 (ref 1.005–1.030)
Urobilinogen, UA: 0.2 mg/dL (ref 0.0–1.0)
pH: 7 (ref 5.0–8.0)

## 2021-07-31 LAB — POC URINE PREG, ED: Preg Test, Ur: NEGATIVE

## 2021-07-31 MED ORDER — FLUCONAZOLE 150 MG PO TABS: 150.0000 mg | ORAL_TABLET | ORAL | 0 refills | Status: AC

## 2021-07-31 NOTE — ED Triage Notes (Signed)
3 day h/o dark cloudy urine and lower abdominal pain. 2 day h/o "cottage cheese and mashed potato" like vaginal discharge. Pt reports chills and nausea.  ?Notes before sxs started Pt was having more intercourse and using a new tool. ?Notes hx of cystitis that occurred after an increase in intercourse frequency. Pt believes sxs are similar. No known STD exposure. Has been taking ibuprofen. ?

## 2021-07-31 NOTE — Discharge Instructions (Signed)
As we discussed, it seems that you have infection prescription to pharmacy for you.  I have also sent your urine for culture, if it is present infection, someone will call you.  We will call you if your vaginal swab also shows an infection.  If your pain significantly worsens, especially if you are vomiting, having fevers, unable to keep anything down, you should be seen by medical provider. ?

## 2021-07-31 NOTE — ED Provider Notes (Signed)
?[facility] ? ? ? ?CSN: [id number] ?Arrival date & time: 07/31/21  0805 ? ? ?  ? ?History   ?Chief Complaint ?Chief Complaint  ?Patient presents with  ? Vaginal Discharge  ? ? ?HPI ?Melissa Cooley is a 41 y.o. female.  ? ?Vaginal Discharge ?Discharge started 3 days ago ?Discharge appears white, thick, cottage cheese like ?She endorses vaginal pruritis ?Denies abnormal vaginal bleeding, hematuria, vomiting, fevers ?Patient reports subjective chills, nausea, pelvic discomfort ?Reports discomfort with urination ?Does not have any partner, but she does have a female sexual partner and states that they used a new strap on this weekend and that she was having intercourse frequently ?Denies concern for STDs ?Patient's last menstrual period was 07/04/2021 (approximate).  ?She does not douche. ?Desires HIV/RPR: no ?  ? ? ?Past Medical History:  ?Diagnosis Date  ? Anxiety   ? Depression   ? GERD (gastroesophageal reflux disease)   ? Gout   ? History of gout 2019  approx  ? History of septic shock 04/20/2020  ? hospital admission in epic;  acute pyenephritis due to uropathy obstruction from ureter calculus  ? Hypertension   ? followed by pcp  ? Left ureteral calculus   ? ? ?Patient Active Problem List  ? Diagnosis Date Noted  ? Acute pyelonephritis 04/21/2020  ? Hypokalemia 04/21/2020  ? Normocytic anemia 04/21/2020  ? Family history of breast cancer in mother 05/11/2017  ? Obese 04/03/2017  ? Hypertension 02/20/2017  ? Irritable bowel syndrome 02/20/2017  ? GERD (gastroesophageal reflux disease) 02/20/2017  ? ? ?Past Surgical History:  ?Procedure Laterality Date  ? CESAREAN SECTION  x2   last one 2011  ? CYSTOSCOPY WITH RETROGRADE PYELOGRAM, URETEROSCOPY AND STENT PLACEMENT Left 04/20/2020  ? Procedure: CYSTOSCOPY WITH Left JJ STENT PLACEMENT;  Surgeon: Remi Haggard, MD;  Location: [facility];  Service: Urology;  Laterality: Left;  ? CYSTOSCOPY WITH RETROGRADE PYELOGRAM, URETEROSCOPY AND STENT PLACEMENT Left 05/08/2020   ? Procedure: CYSTOSCOPY WITH RETROGRADE PYELOGRAM, URETEROSCOPY WITH LASER , AND STENT EXCHANGE;  Surgeon: Remi Haggard, MD;  Location: [facility];  Service: Urology;  Laterality: Left;  ? HOLMIUM LASER APPLICATION Left [id number]  ? Procedure: HOLMIUM LASER APPLICATION;  Surgeon: Remi Haggard, MD;  Location: [facility];  Service: Urology;  Laterality: Left;  ? LAPAROSCOPIC CHOLECYSTECTOMY  2003  ? TUBAL LIGATION Bilateral 2017  ? ? ?OB History   ? ? Gravida  ?5  ? Para  ?2  ? Term  ?   ? Preterm  ?   ? AB  ?3  ? Living  ?2  ?  ? ? SAB  ?   ? IAB  ?3  ? Ectopic  ?   ? Multiple  ?   ? Live Births  ?2  ?   ?  ?  ? ? ? ?Home Medications   ? ?Prior to Admission medications   ?Medication Sig Start Date End Date Taking? Authorizing Provider  ?fluconazole (DIFLUCAN) 150 MG tablet Take 1 tablet (150 mg total) by mouth every 3 (three) days for 2 doses. 07/31/21 08/04/21 Yes Courtnei Ruddell, Bernita Raisin, DO  ?amLODipine (NORVASC) 10 MG tablet TAKE 1 TABLET (10 MG TOTAL) BY MOUTH DAILY. 04/23/20 04/23/21  Dwyane Dee, MD  ?pantoprazole (PROTONIX) 40 MG tablet Take 40 mg by mouth daily.    [provider]  ?hydrochlorothiazide (MICROZIDE) 12.5 MG capsule Take 1 capsule (12.5 mg total) by mouth daily. ?Patient not taking:  Reported on 04/20/2020 09/05/19 04/23/20  Charlott Rakes, MD  ?irbesartan (AVAPRO) 150 MG tablet Take 1 tablet (150 mg total) by mouth daily. ?Patient not taking: Reported on 04/20/2020 09/05/19 04/23/20  Charlott Rakes, MD  ? ? ?Family History ?Family History  ?Problem Relation Age of Onset  ? Cancer Mother   ? Drug abuse Mother   ? Bipolar disorder Mother   ? Breast cancer Mother 72  ? Diabetes Father   ? Hypertension Other   ? Anxiety disorder Sister   ? Drug abuse Brother   ? Lupus Maternal Aunt   ? ? ?Social History ?Social History  ? ?Tobacco Use  ? Smoking status: Every Day  ?  Years: 3.00  ?  Types: Cigarettes  ?  Last attempt to quit: 05/04/2003  ?  Years since quitting:  18.2  ? Smokeless tobacco: Never  ?Vaping Use  ? Vaping Use: Never used  ?Substance Use Topics  ? Alcohol use: No  ? Drug use: Never  ? ? ? ?Allergies   ?Patient has no known allergies. ? ? ?Review of Systems ?Review of Systems  ?All other systems reviewed and are negative. ?Per HPI ? ?Physical Exam ?Triage Vital Signs ?ED Triage Vitals  ?Enc Vitals Group  ?   BP   ?   Pulse   ?   Resp   ?   Temp   ?   Temp src   ?   SpO2   ?   Weight   ?   Height   ?   Head Circumference   ?   Peak Flow   ?   Pain Score   ?   Pain Loc   ?   Pain Edu?   ?   Excl. in [place]?   ? ?No data found. ? ?Updated Vital Signs ?BP 109/73 (BP Location: Right Arm)   Pulse 72   Temp 98.3 ?F (36.8 ?C) (Oral)   Resp 18   LMP 07/04/2021 (Approximate)   SpO2 98%  ? ?Visual Acuity ?Right Eye Distance:   ?Left Eye Distance:   ?Bilateral Distance:   ? ?Right Eye Near:   ?Left Eye Near:    ?Bilateral Near:    ? ?Physical Exam ?Constitutional:   ?   Appearance: Normal appearance.  ?HENT:  ?   Head: Normocephalic and atraumatic.  ?   Mouth/Throat:  ?   Mouth: Mucous membranes are moist.  ?Eyes:  ?   Conjunctiva/sclera: Conjunctivae normal.  ?Cardiovascular:  ?   Rate and Rhythm: Normal rate.  ?Pulmonary:  ?   Effort: Pulmonary effort is normal.  ?Abdominal:  ?   Palpations: Abdomen is soft.  ?   Tenderness: There is no right CVA tenderness or left CVA tenderness.  ?Neurological:  ?   Mental Status: She is alert and oriented to person, place, and time.  ?Psychiatric:     ?   Mood and Affect: Mood normal.     ?   Behavior: Behavior normal.  ? ? ? ?UC Treatments / Results  ?Labs ?(all labs ordered are listed, but only abnormal results are displayed) ?Labs Reviewed  ?POCT URINALYSIS DIPSTICK, ED / UC - Abnormal; Notable for the following components:  ?    Result Value  ? Hgb urine dipstick TRACE (*)   ? Protein, ur 30 (*)   ? All other components within normal limits  ?URINE CULTURE  ?POC URINE PREG, ED  ?CERVICOVAGINAL ANCILLARY ONLY   ? ? ?EKG ? ? ?  Radiology ?No results found. ? ?Procedures ?Procedures (including critical care time) ? ?Medications Ordered in UC ?Medications - No data to display ? ?Initial Impression / Assessment and Plan / UC Course  ?I have reviewed the triage vital signs and the nursing notes. ? ?Pertinent labs & imaging results that were available during my care of the patient were reviewed by me and considered in my medical decision making (see chart for details). ? ?  ? ?Urine does not show obvious infection, will send for confirmatory culture.  Urine pregnancy test was negative.  Swab performed to test vaginal discharge, however her symptoms are very much consistent with yeast vaginitis, we will go ahead and treat for this.  We will treat for anything else based off of the results.  Given return precautions, see AVS. ? ? ?Final Clinical Impressions(s) / UC Diagnoses  ? ?Final diagnoses:  ?Vaginal discharge  ?Dysuria  ?Vaginal yeast infection  ? ? ? ?Discharge Instructions   ? ?  ?As we discussed, it seems that you have infection prescription to pharmacy for you.  I have also sent your urine for culture, if it is present infection, someone will call you.  We will call you if your vaginal swab also shows an infection.  If your pain significantly worsens, especially if you are vomiting, having fevers, unable to keep anything down, you should be seen by medical provider. ? ? ? ? ?ED Prescriptions   ? ? Medication Sig Dispense Auth. Provider  ? fluconazole (DIFLUCAN) 150 MG tablet Take 1 tablet (150 mg total) by mouth every 3 (three) days for 2 doses. 2 tablet Rosmarie Esquibel, Bernita Raisin, DO  ? ?  ? ?PDMP not reviewed this encounter. ?  ?Cleophas Dunker, DO ?07/31/21 0915 ? ?

## 2021-08-01 LAB — CERVICOVAGINAL ANCILLARY ONLY
Bacterial Vaginitis (gardnerella): NEGATIVE
Candida Glabrata: NEGATIVE
Candida Vaginitis: NEGATIVE
Chlamydia: NEGATIVE
Comment: NEGATIVE
Comment: NEGATIVE
Comment: NEGATIVE
Comment: NEGATIVE
Comment: NEGATIVE
Comment: NORMAL
Neisseria Gonorrhea: NEGATIVE
Trichomonas: NEGATIVE

## 2021-08-01 LAB — URINE CULTURE

## 2021-08-07 DIAGNOSIS — R300 Dysuria: Secondary | ICD-10-CM | POA: Diagnosis not present

## 2021-08-07 DIAGNOSIS — Z6833 Body mass index (BMI) 33.0-33.9, adult: Secondary | ICD-10-CM | POA: Diagnosis not present

## 2021-08-07 DIAGNOSIS — N390 Urinary tract infection, site not specified: Secondary | ICD-10-CM | POA: Diagnosis not present

## 2021-08-08 DIAGNOSIS — Z6833 Body mass index (BMI) 33.0-33.9, adult: Secondary | ICD-10-CM | POA: Diagnosis not present

## 2021-08-08 DIAGNOSIS — I10 Essential (primary) hypertension: Secondary | ICD-10-CM | POA: Diagnosis not present

## 2021-08-08 DIAGNOSIS — R002 Palpitations: Secondary | ICD-10-CM | POA: Diagnosis not present

## 2021-08-19 DIAGNOSIS — J209 Acute bronchitis, unspecified: Secondary | ICD-10-CM | POA: Diagnosis not present

## 2021-08-28 ENCOUNTER — Encounter: Payer: Medicaid Other | Admitting: Vascular Surgery

## 2021-11-18 ENCOUNTER — Emergency Department (HOSPITAL_BASED_OUTPATIENT_CLINIC_OR_DEPARTMENT_OTHER)
Admission: EM | Admit: 2021-11-18 | Discharge: 2021-11-18 | Disposition: A | Discharge status: Home / Self Care | Payer: Medicaid Other | Attending: Emergency Medicine | Admitting: Emergency Medicine

## 2021-11-18 ENCOUNTER — Encounter (HOSPITAL_BASED_OUTPATIENT_CLINIC_OR_DEPARTMENT_OTHER): Payer: Self-pay | Admitting: Emergency Medicine

## 2021-11-18 DIAGNOSIS — K0889 Other specified disorders of teeth and supporting structures: Secondary | ICD-10-CM | POA: Diagnosis present

## 2021-11-18 DIAGNOSIS — I10 Essential (primary) hypertension: Secondary | ICD-10-CM | POA: Diagnosis not present

## 2021-11-18 DIAGNOSIS — K029 Dental caries, unspecified: Secondary | ICD-10-CM | POA: Insufficient documentation

## 2021-11-18 DIAGNOSIS — K047 Periapical abscess without sinus: Secondary | ICD-10-CM | POA: Diagnosis not present

## 2021-11-18 MED ORDER — AMOXICILLIN-POT CLAVULANATE 875-125 MG PO TABS: 1.0000 | ORAL_TABLET | Freq: Two times a day (BID) | ORAL | 0 refills | Status: DC

## 2021-11-18 NOTE — ED Triage Notes (Signed)
Pt c/o left jaw pain and swelling from known actively draining abcess for past 2 days.  Tylenol OTC at approx 7am today with minimal relief.

## 2021-11-18 NOTE — Discharge Instructions (Addendum)
You were seen today for a draining dental infection consistent with abscess.  Please take the prescribed antibiotic and follow-up with dentistry for possible extraction or other intervention.  If you develop any life-threatening conditions please return to the emergency department further evaluation

## 2021-11-18 NOTE — ED Provider Notes (Signed)
[facility] EMERGENCY DEPT Provider Note   CSN: [id number] Arrival date & time: 11/18/21  1243     History  Chief Complaint  Patient presents with   Abscess    Melissa Cooley is a 41 y.o. female.  Patient presents with a chief complaint of a draining dental abscess.  Patient complains of dental pain and mild nausea after the drainage occurred.  The patient states that she has a history of poor dentition.  She states she has had multiple dental abscesses in the past.  She states that she moved to this area 3 years ago and has not establish care with a dentist.  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  Patient has been treated successfully with antibiotics in the past.  Past medical history otherwise significant for GERD, gout, anxiety, depression, hypertension  HPI     Home Medications Prior to Admission medications   Medication Sig Start Date End Date Taking? Authorizing Provider  amoxicillin-clavulanate (AUGMENTIN) 875-125 MG tablet Take 1 tablet by mouth every 12 (twelve) hours. 11/18/21  Yes Barrie Dunker B, PA-C  amLODipine (NORVASC) 10 MG tablet TAKE 1 TABLET (10 MG TOTAL) BY MOUTH DAILY. 04/23/20 04/23/21  Lewie Chamber, MD  pantoprazole (PROTONIX) 40 MG tablet Take 40 mg by mouth daily.    [provider]  hydrochlorothiazide (MICROZIDE) 12.5 MG capsule Take 1 capsule (12.5 mg total) by mouth daily. Patient not taking: Reported on 04/20/2020 09/05/19 04/23/20  Hoy Register, MD  irbesartan (AVAPRO) 150 MG tablet Take 1 tablet (150 mg total) by mouth daily. Patient not taking: Reported on 04/20/2020 09/05/19 04/23/20  Hoy Register, MD      Allergies    Patient has no known allergies.    Review of Systems   Review of Systems  HENT:  Positive for dental problem.   Gastrointestinal:  Positive for nausea.    Physical Exam Updated Vital Signs BP 131/78 (BP  Location: Left Arm)   Pulse 66   Temp 97.9 F (36.6 C)   Resp 18   LMP 10/25/2021 (Exact Date)   SpO2 98%  Physical Exam Vitals and nursing note reviewed.  Constitutional:      General: She is not in acute distress. HENT:     Head: Normocephalic and atraumatic.     Mouth/Throat:     Mouth: Mucous membranes are moist.     Dentition: Dental tenderness, gingival swelling, dental caries and dental abscesses present.     Tongue: Tongue does not deviate from midline.     Palate: No lesions.     Pharynx: Uvula midline. No pharyngeal swelling, oropharyngeal exudate, posterior oropharyngeal erythema or uvula swelling.     Tonsils: No tonsillar exudate or tonsillar abscesses.   Eyes:     Conjunctiva/sclera: Conjunctivae normal.  Cardiovascular:     Rate and Rhythm: Normal rate and regular rhythm.     Pulses: Normal pulses.     Heart sounds: Normal heart sounds.  Pulmonary:     Effort: Pulmonary effort is normal.     Breath sounds: Normal breath sounds.  Abdominal:     Palpations: Abdomen is soft.     Tenderness: There is no abdominal tenderness.  Musculoskeletal:        General: Normal range of motion.     Cervical back: Normal range of motion and neck supple.  Skin:    General: Skin  is warm and dry.     Capillary Refill: Capillary refill takes less than 2 seconds.  Neurological:     Mental Status: She is alert and oriented to person, place, and time.     ED Results / Procedures / Treatments   Labs (all labs ordered are listed, but only abnormal results are displayed) Labs Reviewed - No data to display  EKG None  Radiology No results found.  Procedures Procedures    Medications Ordered in ED Medications - No data to display  ED Course/ Medical Decision Making/ A&P                           Medical Decision Making  Patient presents to the hospital with a chief complaint of draining dental infection.  Differential includes but not limited to dental abscess,  peritonsillar abscess, retropharyngeal abscess, and others  I reviewed the patient's past medical history.  I see no visits in the past 2 years related to dental complaints  Considered ordering labs but the patient's vitals are normal and she is not septic.  There is no indication for imaging at this time.  The patient has no dental insurance and is a Medicaid patient, unable to afford dentistry at this time.  The patient has a normal voice with normal phonation.  No tonsillar swelling, no soft tissue swelling noted.  No clinical suspicion at this time of peritonsillar abscess, retropharyngeal abscess.  There is a small area with obvious abscess at the upper back left molar.  Patient states she has had a history of abscesses in the same area in the past.  Plan to place patient on antibiotics and discharge her home.  Patient knows she needs to see dentistry for follow-up.  Patient is working on finding an Conservator, museum/gallery.        Final Clinical Impression(s) / ED Diagnoses Final diagnoses:  Dental abscess    Rx / DC Orders ED Discharge Orders          Ordered    amoxicillin-clavulanate (AUGMENTIN) 875-125 MG tablet  Every 12 hours        11/18/21 1554              Pamala Duffel 11/18/21 1601    Virgina Norfolk, DO 11/18/21 1610

## 2021-12-06 ENCOUNTER — Other Ambulatory Visit: Payer: Self-pay | Admitting: Physician Assistant

## 2021-12-06 DIAGNOSIS — Z1231 Encounter for screening mammogram for malignant neoplasm of breast: Secondary | ICD-10-CM

## 2021-12-19 ENCOUNTER — Ambulatory Visit: Payer: Medicaid Other

## 2022-02-14 ENCOUNTER — Ambulatory Visit
Admission: RE | Admit: 2022-02-14 | Discharge: 2022-02-14 | Disposition: A | Discharge status: Home / Self Care | Payer: Medicaid Other | Source: Ambulatory Visit | Attending: Physician Assistant | Admitting: Physician Assistant

## 2022-02-14 DIAGNOSIS — Z1231 Encounter for screening mammogram for malignant neoplasm of breast: Secondary | ICD-10-CM

## 2022-02-18 ENCOUNTER — Ambulatory Visit: Payer: Medicaid Other | Attending: Cardiology | Admitting: Cardiology

## 2022-04-03 ENCOUNTER — Encounter: Payer: Self-pay | Admitting: *Deleted

## 2022-05-09 ENCOUNTER — Encounter (HOSPITAL_COMMUNITY): Payer: Self-pay

## 2022-05-09 ENCOUNTER — Ambulatory Visit (HOSPITAL_COMMUNITY)
Admission: RE | Admit: 2022-05-09 | Discharge: 2022-05-09 | Disposition: A | Discharge status: Home / Self Care | Payer: Medicaid Other | Source: Ambulatory Visit | Attending: Family Medicine | Admitting: Family Medicine

## 2022-05-09 VITALS — BP 133/87 | HR 73 | Temp 98.6°F | Resp 18

## 2022-05-09 DIAGNOSIS — L02216 Cutaneous abscess of umbilicus: Secondary | ICD-10-CM | POA: Insufficient documentation

## 2022-05-09 MED ORDER — SULFAMETHOXAZOLE-TRIMETHOPRIM 800-160 MG PO TABS: 1.0000 | ORAL_TABLET | Freq: Two times a day (BID) | ORAL | 0 refills | Status: AC

## 2022-05-09 NOTE — Discharge Instructions (Addendum)
You were seen today for an infection around the umbilicus.  This was sent for culture and you will be notified if a change in antibiotic is needed.  I have sent out bactrim twice/day x 7 days.  Please keep the area covered if draining.  Follow up if not improving.

## 2022-05-09 NOTE — ED Provider Notes (Signed)
[place]    CSN: [id number] Arrival date & time: 05/09/22  1313      History   Chief Complaint Chief Complaint  Patient presents with   Navel Infection     HPI Melissa Cooley is a 42 y.o. female.   She is here for issues with her naval.  Started having pain about 1 week ago, and started with drainage last night.  It is very foul smelling.  This has never happened before.  No injury, no piercing.         Past Medical History:  Diagnosis Date   Anxiety    Depression    GERD (gastroesophageal reflux disease)    Gout    History of gout 2019  approx   History of septic shock 04/20/2020   hospital admission in epic;  acute pyenephritis due to uropathy obstruction from ureter calculus   Hypertension    followed by pcp   Left ureteral calculus     Patient Active Problem List   Diagnosis Date Noted   Acute pyelonephritis 04/21/2020   Hypokalemia 04/21/2020   Normocytic anemia 04/21/2020   Family history of breast cancer in mother 05/11/2017   Obese 04/03/2017   Hypertension 02/20/2017   Irritable bowel syndrome 02/20/2017   GERD (gastroesophageal reflux disease) 02/20/2017    Past Surgical History:  Procedure Laterality Date   CESAREAN SECTION  x2   last one 2011   [place], URETEROSCOPY AND STENT PLACEMENT Left 04/20/2020   Procedure: CYSTOSCOPY WITH Left [place];  Surgeon: Remi Haggard, MD;  Location: [facility];  Service: Urology;  Laterality: Left;   CYSTOSCOPY WITH RETROGRADE PYELOGRAM, URETEROSCOPY AND STENT PLACEMENT Left 05/08/2020   Procedure: CYSTOSCOPY WITH RETROGRADE PYELOGRAM, URETEROSCOPY WITH LASER , AND STENT EXCHANGE;  Surgeon: Remi Haggard, MD;  Location: [facility];  Service: Urology;  Laterality: Left;   HOLMIUM LASER APPLICATION Left [id number]   Procedure: HOLMIUM LASER APPLICATION;  Surgeon: Remi Haggard, MD;  Location: [facility];  Service: Urology;   Laterality: Left;   LAPAROSCOPIC CHOLECYSTECTOMY  2003   TUBAL LIGATION Bilateral 2017    OB History     Gravida  5   Para  2   Term      Preterm      AB  3   Living  2      SAB      IAB  3   Ectopic      Multiple      Live Births  2            Home Medications    Prior to Admission medications   Medication Sig Start Date End Date Taking? Authorizing Provider  amLODipine (NORVASC) 10 MG tablet TAKE 1 TABLET (10 MG TOTAL) BY MOUTH DAILY. 04/23/20 05/09/22 Yes Dwyane Dee, MD  pantoprazole (PROTONIX) 40 MG tablet Take 40 mg by mouth daily.   Yes [provider]  amoxicillin-clavulanate (AUGMENTIN) 875-125 MG tablet Take 1 tablet by mouth every 12 (twelve) hours. 11/18/21   Dorothyann Peng, PA-C  hydrochlorothiazide (MICROZIDE) 12.5 MG capsule Take 1 capsule (12.5 mg total) by mouth daily. Patient not taking: Reported on 04/20/2020 09/05/19 04/23/20  Charlott Rakes, MD  irbesartan (AVAPRO) 150 MG tablet Take 1 tablet (150 mg total) by mouth daily. Patient not taking: Reported on 04/20/2020 09/05/19 04/23/20  Charlott Rakes, MD    Family History Family History  Problem Relation Age of  Onset   Cancer Mother    Drug abuse Mother    Bipolar disorder Mother    Breast cancer Mother 19   Diabetes Father    Hypertension Other    Anxiety disorder Sister    Drug abuse Brother    Lupus Maternal Aunt     Social History Social History   Tobacco Use   Smoking status: Every Day    Years: 3.00    Types: Cigarettes    Last attempt to quit: 05/04/2003    Years since quitting: 19.0   Smokeless tobacco: Never  Vaping Use   Vaping Use: Never used  Substance Use Topics   Alcohol use: No   Drug use: Never     Allergies   Patient has no known allergies.   Review of Systems Review of Systems  Constitutional: Negative.   HENT: Negative.    Respiratory: Negative.    Cardiovascular: Negative.   Gastrointestinal: Negative.   Musculoskeletal: Negative.    Skin:  Positive for wound.     Physical Exam Triage Vital Signs ED Triage Vitals  Enc Vitals Group     BP 05/09/22 1328 133/87     Pulse Rate 05/09/22 1328 73     Resp 05/09/22 1328 18     Temp 05/09/22 1328 98.6 F (37 C)     Temp Source 05/09/22 1328 Oral     SpO2 05/09/22 1328 99 %     Weight --      Height --      Head Circumference --      Peak Flow --      Pain Score 05/09/22 1326 10     Pain Loc --      Pain Edu? --      Excl. in [place]? --    No data found.  Updated Vital Signs BP 133/87 (BP Location: Left Arm)   Pulse 73   Temp 98.6 F (37 C) (Oral)   Resp 18   LMP 04/17/2022 (Approximate)   SpO2 99%   Visual Acuity Right Eye Distance:   Left Eye Distance:   Bilateral Distance:    Right Eye Near:   Left Eye Near:    Bilateral Near:     Physical Exam Constitutional:      Appearance: Normal appearance.  Cardiovascular:     Rate and Rhythm: Normal rate.  Pulmonary:     Effort: Pulmonary effort is normal.  Skin:    Comments: At the umbilicus, 6 oclock position there is a raised area that is red, tender, with drainage noted;  yellow/bloody, minimal  Neurological:     General: No focal deficit present.     Mental Status: She is alert.  Psychiatric:        Mood and Affect: Mood normal.      UC Treatments / Results  Labs (all labs ordered are listed, but only abnormal results are displayed) Labs Reviewed - No data to display  EKG   Radiology No results found.  Procedures Procedures (including critical care time)  Medications Ordered in UC Medications - No data to display  Initial Impression / Assessment and Plan / UC Course  I have reviewed the triage vital signs and the nursing notes.  Pertinent labs & imaging results that were available during my care of the patient were reviewed by me and considered in my medical decision making (see chart for details).     Final diagnoses:  Abscess of umbilicus  Discharge Instructions       You were seen today for an infection around the umbilicus.  This was sent for culture and you will be notified if a change in antibiotic is needed.  I have sent out bactrim twice/day x 7 days.  Please keep the area covered if draining.  Follow up if not improving.     ED Prescriptions     Medication Sig Dispense Auth. Provider   sulfamethoxazole-trimethoprim (BACTRIM DS) 800-160 MG tablet Take 1 tablet by mouth 2 (two) times daily for 7 days. 14 tablet Rondel Oh, MD      PDMP not reviewed this encounter.   Rondel Oh, MD 05/09/22 [phone]

## 2022-05-09 NOTE — ED Triage Notes (Signed)
Pt states that her navel has been hurting since Saturday and yesterday it started draining. She has been taking tylenol without relief.

## 2022-05-12 LAB — AEROBIC CULTURE W GRAM STAIN (SUPERFICIAL SPECIMEN)

## 2022-06-01 ENCOUNTER — Ambulatory Visit (HOSPITAL_COMMUNITY): Payer: Self-pay

## 2022-09-08 ENCOUNTER — Ambulatory Visit (INDEPENDENT_AMBULATORY_CARE_PROVIDER_SITE_OTHER): Payer: Medicaid Other | Admitting: Obstetrics and Gynecology

## 2022-09-08 ENCOUNTER — Encounter: Payer: Self-pay | Admitting: Obstetrics and Gynecology

## 2022-09-08 ENCOUNTER — Other Ambulatory Visit: Payer: Self-pay

## 2022-09-08 VITALS — BP 139/87 | HR 116 | Ht 71.0 in | Wt 253.0 lb

## 2022-09-08 DIAGNOSIS — N939 Abnormal uterine and vaginal bleeding, unspecified: Secondary | ICD-10-CM

## 2022-09-08 DIAGNOSIS — Z803 Family history of malignant neoplasm of breast: Secondary | ICD-10-CM | POA: Diagnosis not present

## 2022-09-08 DIAGNOSIS — D219 Benign neoplasm of connective and other soft tissue, unspecified: Secondary | ICD-10-CM | POA: Diagnosis not present

## 2022-09-08 NOTE — Patient Instructions (Addendum)
[facility] Tel: [phone] [address]) [address] [place], [place] [address]  [facility] Medicine [place], [place] [address] [place], [place] [address] Phone: [phone]

## 2022-09-08 NOTE — Progress Notes (Unsigned)
Obstetrics and Gynecology Visit Return Patient Evaluation  Appointment Date: 09/08/2022  Primary Care Provider: Hoy Cooley  OBGYN Clinic: Center for [facility]  Chief Complaint: GYN questions re: abnormal May period, fibroids  History of Present Illness:  Melissa Cooley is a 42 y.o. with above chief complaint. PMHx significant for h/o c-sections and BTL, HTN, FHx of breast cancer  Patient has qmonth periods that are 5 days and not heavy or painful but in may she had one that lasted from may 5-20; June period back to normal. Patient also wondering about fibroids. Several years ago when she had her BTL in [place] they were wanting her to have surgery for it but patient had to move. Patient unsure exactly why they wanted to do surgery. Patient ever denies any bleeding issues or any bulk s/s.   Review of Systems:  as noted in the History of Present Illness.  Patient Active Problem List   Diagnosis Date Noted   Acute pyelonephritis 04/21/2020   Hypokalemia 04/21/2020   Normocytic anemia 04/21/2020   Family history of breast cancer in mother 05/11/2017   Obese 04/03/2017   Hypertension 02/20/2017   Irritable bowel syndrome 02/20/2017   GERD (gastroesophageal reflux disease) 02/20/2017   Medications:  Melissa Cooley "Meme" had no medications administered during this visit. Current Outpatient Medications  Medication Sig Dispense Refill   amLODipine (NORVASC) 10 MG tablet TAKE 1 TABLET (10 MG TOTAL) BY MOUTH DAILY. 30 tablet 3   Multiple Vitamins-Minerals (WOMENS MULTIVITAMIN PO) Take by mouth.     pantoprazole (PROTONIX) 40 MG tablet Take 40 mg by mouth daily.     No current facility-administered medications for this visit.    Allergies: has No Known Allergies.  Physical Exam:  BP 139/87   Pulse (!) 116   Ht 5\' 11"  (1.803 m)   Wt 253 lb (114.8 kg)   LMP 08/28/2022 (Within Days)   BMI 35.29 kg/m  Body mass index is 35.29 kg/m. General  appearance: Well nourished, well developed female in no acute distress.  Neuro/Psych:  Normal mood and affect.    Labs: none  Radiology: Narrative & Impression  CLINICAL DATA:  Left flank pain. Kidney stone suspected. Suspected pyelonephritis. Vomiting and fever.   EXAM: CT ABDOMEN AND PELVIS WITH CONTRAST   TECHNIQUE: Multidetector CT imaging of the abdomen and pelvis was performed using the standard protocol following bolus administration of intravenous contrast.   CONTRAST:  OMNIPAQUE IOHEXOL 300 MG/ML  SOLN   COMPARISON:  None.   FINDINGS: Lower chest: Mild atelectasis in the left base. No other acute abnormalities in the lower chest.   Hepatobiliary: No focal liver abnormality is seen. Status post cholecystectomy. No biliary dilatation.   Pancreas: Unremarkable. No pancreatic ductal dilatation or surrounding inflammatory changes.   Spleen: Normal in size without focal abnormality.   Adrenals/Urinary Tract: There is a 1 or 2 mm stone in the right kidney seen on coronal image 94. A probable punctate stone is seen in the right kidney on coronal image 81. No masses, hydronephrosis, or perinephric stranding on the right. The right ureter is normal.   The left kidney is enlarged compared to the right with less enhancement of the left renal cortex compared to the right. No striated nephrogram identified. There is a 2 mm stone in the left kidney seen on coronal image 70. No other stones are seen in the left kidney. There is moderate hydronephrosis and perinephric stranding on the left. The proximal  left ureter is dilated. There is a 5 mm stone in the proximal left ureter seen on coronal image 75. The remainder of the left ureter is unremarkable and on dilated with no additional stones identified.   The bladder is normal.   Stomach/Bowel: Stomach is within normal limits. Appendix appears normal. No evidence of bowel wall thickening, distention, or inflammatory  changes.   Vascular/Lymphatic: No significant vascular findings are present. No enlarged abdominal or pelvic lymph nodes.   Reproductive: Uterus and bilateral adnexa are unremarkable.   Other: No abdominal wall hernia or abnormality. No abdominopelvic ascites.   Musculoskeletal: No acute or significant osseous findings.   IMPRESSION: 1. There is a 5 mm stone in the proximal left ureter resulting in ureterectasis, moderate hydronephrosis, and moderate perinephric stranding. There is also decreased enhancement in the left renal cortex versus the right suggesting significant obstruction. No abscess. No striated nephrogram to confirm the clinical suspicion of pyelonephritis. 2. Small stones in both kidneys as above.     Electronically Signed   By: Gerome Sam III M.D   On: 04/20/2020 09:42   Assessment: patient doing well  Plan: *AUB: recommend expectant management. If it comes back, can do work up such as u/s and basic labs, consider endometrial biopsy *GYN: Patient aware due for routine pap in the Fall of this year *FHx of breast cancer: patient states she's already had genetic counseling and negative blood testing and getting mammograms yearly *Fibroids: nothing noted on CT scan, on radiology read. I looked at the images and there is some very slight distortion of the uterus which could indicate fibroids but nothing obvious, and if there are fibroids, they are not very big. I did tell her if she does have any continued AUB that an u/s can help show if there are any fibroids that could cause AUB, such as submucosal ones.   RTC: late 2025 for annual and pap.   Future Appointments  Date Time Provider Department Center  09/09/2022  3:45 PM Merrilyn Puma, MD [facility]    Cornelia Copa MD Attending Center for [facility])

## 2022-09-09 ENCOUNTER — Encounter: Payer: Self-pay | Admitting: Obstetrics and Gynecology

## 2022-09-09 ENCOUNTER — Ambulatory Visit: Payer: Medicaid Other | Admitting: Student

## 2022-09-09 ENCOUNTER — Encounter: Payer: Self-pay | Admitting: Student

## 2022-09-09 ENCOUNTER — Other Ambulatory Visit: Payer: Self-pay

## 2022-09-09 VITALS — BP 133/73 | HR 68 | Temp 98.2°F | Ht 71.0 in | Wt 254.5 lb

## 2022-09-09 DIAGNOSIS — I10 Essential (primary) hypertension: Secondary | ICD-10-CM | POA: Diagnosis not present

## 2022-09-09 DIAGNOSIS — Z803 Family history of malignant neoplasm of breast: Secondary | ICD-10-CM

## 2022-09-09 DIAGNOSIS — E669 Obesity, unspecified: Secondary | ICD-10-CM | POA: Diagnosis not present

## 2022-09-09 DIAGNOSIS — Z6835 Body mass index (BMI) 35.0-35.9, adult: Secondary | ICD-10-CM | POA: Diagnosis not present

## 2022-09-09 DIAGNOSIS — F419 Anxiety disorder, unspecified: Secondary | ICD-10-CM

## 2022-09-09 DIAGNOSIS — Z87891 Personal history of nicotine dependence: Secondary | ICD-10-CM

## 2022-09-09 DIAGNOSIS — F32A Depression, unspecified: Secondary | ICD-10-CM

## 2022-09-09 DIAGNOSIS — K219 Gastro-esophageal reflux disease without esophagitis: Secondary | ICD-10-CM

## 2022-09-09 NOTE — Assessment & Plan Note (Signed)
Patient reports history of bipolar disorder in the past but unable to state what medications she has tried. She does endorse depression and anxiety over the past year. In Dec 16, 2022her sister passed away at age 42 unexpectedly. In Jan 2023, her daughter's father passed away from homicide. In Feb 16, 2023she was hospitalized with acute pyelonephritis leading to septic shock and requiring and ICU stay. After these series of events, she has become a lot more anxious and depressed in her daily life. She notes generalized anxiety related to work, family, personal health. GAD-7 of 21 today. She also notes symptoms of depression (altered sleep and eating, depressed mood, loss of interest). She denies any HI/SI. PHQ-9 of 24 today. She is worried about her health and thus low suspicion that she would do anything to harm herself. However, she does need to establish with psychiatry for counseling and medication management given history of bipolar disorder. Will also refer her to [name] for CBT while awaiting psychiatry visit.  Plan: -referral to [facility] -referral to psychiatry

## 2022-09-09 NOTE — Assessment & Plan Note (Signed)
History of HTN, currently on norvasc 10mg  daily. She was previously on irbesartan and hydrochlorothiazide as well but unclear when these were stopped. BP is 154/73 with repeat improved to 133/73 today. Goal is <140/90 so she is at goal.  Plan: -continue norvasc 10mg  daily -f/u in 1 month

## 2022-09-09 NOTE — Patient Instructions (Addendum)
Melissa Cooley,  It was a pleasure seeing you in the clinic today.   Your blood pressure is fine today. No need to change your current regimen. Like we discussed, please decrease your salt intake and try to introduce at least 2.5 hours of exercise each week (where you are breaking a sweat). I have placed a referral to [name] for counseling. She will be in contact with you. I have also placed a referral to psychiatry for medication management and counseling. We will request your records from [facility]. Please come back to see [place] in 3 months.  Please call our clinic at [phone] if you have any questions or concerns. The best time to call is Monday-Friday from 9am-4pm, but there is someone available 24/7 at the same number. If you need medication refills, please notify your pharmacy one week in advance and they will send [place] a request.   Thank you for letting us take part in your care. We look forward to seeing you next time!

## 2022-09-09 NOTE — Progress Notes (Signed)
CC: new patient, establishing care  HPI:  [name] is a 42 y.o. female with history listed below presenting to the [facility] for new patient, establishing care. Please see individualized problem based charting for full HPI.  Past Medical History:  Diagnosis Date   Anxiety    Depression    GERD (gastroesophageal reflux disease)    History of gout 2019  approx   History of septic shock 04/20/2020   hospital admission in epic;  acute pyenephritis due to uropathy obstruction from ureter calculus   Hypertension    followed by pcp   Past Surgical History:  Procedure Laterality Date   CESAREAN SECTION  x2   last one 2011   CYSTOSCOPY WITH RETROGRADE PYELOGRAM, URETEROSCOPY AND STENT PLACEMENT Left 04/20/2020   Procedure: CYSTOSCOPY WITH Left JJ STENT PLACEMENT;  Surgeon: Belva Agee, MD;  Location: [facility];  Service: Urology;  Laterality: Left;   CYSTOSCOPY WITH RETROGRADE PYELOGRAM, URETEROSCOPY AND STENT PLACEMENT Left 05/08/2020   Procedure: CYSTOSCOPY WITH RETROGRADE PYELOGRAM, URETEROSCOPY WITH LASER , AND STENT EXCHANGE;  Surgeon: Belva Agee, MD;  [facility]: [facility];  Service: Urology;  Laterality: Left;   HOLMIUM LASER APPLICATION Left 05/08/2020   Procedure: HOLMIUM LASER APPLICATION;  Surgeon: Belva Agee, MD;  Location: [facility];  Service: Urology;  Laterality: Left;   LAPAROSCOPIC CHOLECYSTECTOMY  2003   TUBAL LIGATION Bilateral 2017   Current Outpatient Medications on File Prior to Visit  Medication Sig Dispense Refill   amLODipine (NORVASC) 10 MG tablet TAKE 1 TABLET (10 MG TOTAL) BY MOUTH DAILY. 30 tablet 3   Multiple Vitamins-Minerals (WOMENS MULTIVITAMIN PO) Take by mouth.     pantoprazole (PROTONIX) 40 MG tablet Take 40 mg by mouth daily.     [DISCONTINUED] hydrochlorothiazide (MICROZIDE) 12.5 MG capsule Take 1 capsule (12.5 mg total) by mouth daily. (Patient not taking: Reported on 04/20/2020) 90 capsule 1    [DISCONTINUED] irbesartan (AVAPRO) 150 MG tablet Take 1 tablet (150 mg total) by mouth daily. (Patient not taking: Reported on 04/20/2020) 90 tablet 1   No current facility-administered medications on file prior to visit.   No Known Allergies   Family History  Problem Relation Age of Onset   Cancer Mother    Drug abuse Mother    Bipolar disorder Mother    Breast cancer Mother 68   Diabetes Father    Hypertension Other    Anxiety disorder Sister    Drug abuse Brother    Lupus Maternal Aunt    Social History   Socioeconomic History   Marital status: Single    Spouse name: Not on file   Number of children: Not on file   Years of education: Not on file   Highest education level: Not on file  Occupational History   Not on file  Tobacco Use   Smoking status: Former    Years: 3    Types: Cigarettes    Quit date: 05/04/2003    Years since quitting: 19.3    Passive exposure: Never   Smokeless tobacco: Never  Vaping Use   Vaping Use: Never used  Substance and Sexual Activity   Alcohol use: No   Drug use: Never   Sexual activity: Yes    Birth control/protection: Surgical  Other Topics Concern   Not on file  Social History Narrative   Not on file   Social Determinants of Health   Financial Resource Strain: Not on file  Food  Insecurity: No Food Insecurity (09/08/2022)   Hunger Vital Sign    Worried About Running Out of Food in the Last Year: Never true    Ran Out of Food in the Last Year: Never true  Transportation Needs: Unmet Transportation Needs (09/08/2022)   PRAPARE - Administrator, Civil Service (Medical): Yes    Lack of Transportation (Non-Medical): Yes  Physical Activity: Not on file  Stress: Not on file  Social Connections: Not on file  Intimate Partner Violence: Not on file     Review of Systems:  Negative aside from that listed in individualized problem based charting.  Physical Exam:  Vitals:   09/09/22 1625 09/09/22 1711  BP: (!) 154/72  133/73  Pulse: 74 68  Temp: 98.2 F (36.8 C)   TempSrc: Oral   SpO2: 99%   Weight: 254 lb 8 oz (115.4 kg)   Height: 5\' 11"  (1.803 m)    Physical Exam Constitutional:      Appearance: Normal appearance. She is obese. She is not ill-appearing.  HENT:     Mouth/Throat:     Mouth: Mucous membranes are moist.     Pharynx: Oropharynx is clear. No oropharyngeal exudate.  Eyes:     General: No scleral icterus.    Extraocular Movements: Extraocular movements intact.     Conjunctiva/sclera: Conjunctivae normal.  Cardiovascular:     Rate and Rhythm: Normal rate and regular rhythm.     Heart sounds: Normal heart sounds. No murmur heard.    No friction rub. No gallop.  Pulmonary:     Effort: Pulmonary effort is normal.     Breath sounds: Normal breath sounds. No wheezing, rhonchi or rales.  Abdominal:     General: Bowel sounds are normal. There is no distension.     Palpations: Abdomen is soft.     Tenderness: There is no abdominal tenderness.  Musculoskeletal:        General: No swelling.  Skin:    General: Skin is warm and dry.  Neurological:     General: No focal deficit present.     Mental Status: She is alert.  Psychiatric:        Mood and Affect: Mood normal.        Behavior: Behavior normal.      Assessment & Plan:   See Encounters Tab for problem based charting.  Patient discussed with Dr.  Sol Blazing

## 2022-09-09 NOTE — Assessment & Plan Note (Signed)
BMI 35.50. Have counseled her on diet and exercise. She notes eating a lot of carbs daily due to stress in her life and has been leading a more sedentary life over the past year. Discussed moderate intensity exercise for >2.5 hours each week. No history of T2DM that she is aware of.

## 2022-09-09 NOTE — Assessment & Plan Note (Addendum)
Long-standing history of GERD. She has been protonix 40mg  daily with good symptom relief. Low suspicion for H pylori gastritis.  -continue protonix 40mg  daily

## 2022-09-09 NOTE — Assessment & Plan Note (Signed)
Mother was diagnosed with breast cancer at age 42. Patient had a screening mammogram in 02/2022 that was normal. Needs repeat screening mammogram in 02/2023.

## 2022-09-12 NOTE — Progress Notes (Signed)
Internal Medicine Clinic Attending  Case discussed with Dr. Jinwala  At the time of the visit.  We reviewed the resident's history and exam and pertinent patient test results.  I agree with the assessment, diagnosis, and plan of care documented in the resident's note.  

## 2022-09-19 ENCOUNTER — Other Ambulatory Visit: Payer: Self-pay

## 2022-09-19 MED ORDER — AMLODIPINE BESYLATE 10 MG PO TABS: ORAL_TABLET | Freq: Every day | ORAL | 3 refills | Status: DC

## 2022-10-01 ENCOUNTER — Ambulatory Visit: Payer: Medicaid Other | Admitting: Licensed Clinical Social Worker

## 2022-10-01 DIAGNOSIS — F419 Anxiety disorder, unspecified: Secondary | ICD-10-CM

## 2022-10-01 DIAGNOSIS — F32A Depression, unspecified: Secondary | ICD-10-CM
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
## 2022-10-20 ENCOUNTER — Other Ambulatory Visit: Payer: Self-pay | Admitting: Student

## 2022-11-04 ENCOUNTER — Ambulatory Visit (INDEPENDENT_AMBULATORY_CARE_PROVIDER_SITE_OTHER): Payer: Medicaid Other | Admitting: Licensed Clinical Social Worker

## 2022-11-04 DIAGNOSIS — F32A Depression, unspecified: Secondary | ICD-10-CM

## 2022-11-04 DIAGNOSIS — F419 Anxiety disorder, unspecified: Secondary | ICD-10-CM

## 2022-11-04 NOTE — BH Specialist Note (Signed)
[facility] contacted patient on today. Patient was still working and unable to speak for duration of session. [facility] and patient agreed to a noon appointment via telehealth. [facility] scheduled patient for 09/04 at noon via telehealth.   [name], MSW, LCSW-A She/Her Behavioral Health Clinician [facility]  Internal Medicine Center Direct Dial:[phone]  Fax [phone] Main Office Phone: [phone] [address] [place] [place], [place] [address] Website: [facility] Internal Medicine [facility]  [place], [place]  [place]

## 2022-11-19 ENCOUNTER — Ambulatory Visit (INDEPENDENT_AMBULATORY_CARE_PROVIDER_SITE_OTHER): Payer: Medicaid Other | Admitting: Licensed Clinical Social Worker

## 2022-11-19 DIAGNOSIS — F419 Anxiety disorder, unspecified: Secondary | ICD-10-CM

## 2022-11-19 DIAGNOSIS — F32A Depression, unspecified: Secondary | ICD-10-CM

## 2022-11-19 NOTE — BH Specialist Note (Signed)
Patient no-showed today's appointment; appointment was for Telephone visit at noon.  Behavioral Health Clinician [facility] from here on out)  attempted patient via Telephone but line was busy and [facility] unable to leave a VM.   [facility] contacted patient from telephone number [phone].    Patient will need to reschedule appointment by calling Internal medicine center [phone].  [name], MSW, LCSW-A She/Her Behavioral Health Clinician [facility]  Internal Medicine Center Direct Dial:[phone]  Fax [phone] Main Office Phone: [phone] [address] [place] [place], [place] [address] Website: [facility] Internal Medicine [facility]  [place], [place]  [place]

## 2022-11-27 ENCOUNTER — Other Ambulatory Visit: Payer: Self-pay

## 2022-11-27 MED ORDER — PANTOPRAZOLE SODIUM 40 MG PO TBEC: 40.0000 mg | DELAYED_RELEASE_TABLET | Freq: Every day | ORAL | 0 refills | Status: DC

## 2022-12-22 ENCOUNTER — Ambulatory Visit (INDEPENDENT_AMBULATORY_CARE_PROVIDER_SITE_OTHER): Payer: Medicaid Other | Admitting: Clinical

## 2022-12-22 DIAGNOSIS — F4310 Post-traumatic stress disorder, unspecified: Secondary | ICD-10-CM

## 2022-12-22 DIAGNOSIS — F1220 Cannabis dependence, uncomplicated: Secondary | ICD-10-CM | POA: Diagnosis not present

## 2022-12-22 DIAGNOSIS — F331 Major depressive disorder, recurrent, moderate: Secondary | ICD-10-CM | POA: Diagnosis not present
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
## 2022-12-24 ENCOUNTER — Ambulatory Visit (HOSPITAL_COMMUNITY): Payer: Medicaid Other | Admitting: Student

## 2023-01-07 ENCOUNTER — Encounter (HOSPITAL_COMMUNITY): Payer: Self-pay

## 2023-01-07 ENCOUNTER — Ambulatory Visit (HOSPITAL_COMMUNITY): Payer: Medicaid Other | Admitting: Clinical

## 2023-01-12 NOTE — Progress Notes (Unsigned)
Pt here today with c/o copious amount of vaginal discharge that has since went away when she took AZO otc.   Pt explained how to obtain self swab and that well call with abnormal results.  Pt verbalized understanding with no further questions.   Leonette Nutting  01/13/23

## 2023-01-13 ENCOUNTER — Ambulatory Visit: Payer: Medicaid Other

## 2023-01-13 ENCOUNTER — Other Ambulatory Visit (HOSPITAL_COMMUNITY)
Admission: RE | Admit: 2023-01-13 | Discharge: 2023-01-13 | Disposition: A | Discharge status: Home / Self Care | Payer: Medicaid Other | Source: Ambulatory Visit | Attending: Family Medicine | Admitting: Family Medicine

## 2023-01-13 ENCOUNTER — Other Ambulatory Visit: Payer: Self-pay

## 2023-01-13 VITALS — BP 135/84 | HR 65 | Wt 246.6 lb

## 2023-01-13 DIAGNOSIS — N898 Other specified noninflammatory disorders of vagina: Secondary | ICD-10-CM | POA: Insufficient documentation

## 2023-01-14 LAB — CERVICOVAGINAL ANCILLARY ONLY
Bacterial Vaginitis (gardnerella): POSITIVE — AB
Candida Glabrata: NEGATIVE
Candida Vaginitis: NEGATIVE
Chlamydia: NEGATIVE
Comment: NEGATIVE
Comment: NEGATIVE
Comment: NEGATIVE
Comment: NEGATIVE
Comment: NEGATIVE
Comment: NORMAL
Neisseria Gonorrhea: NEGATIVE
Trichomonas: NEGATIVE

## 2023-01-15 ENCOUNTER — Other Ambulatory Visit: Payer: Self-pay | Admitting: Family Medicine

## 2023-01-15 MED ORDER — METRONIDAZOLE 0.75 % VA GEL: 1.0000 | Freq: Every day | VAGINAL | 0 refills | Status: AC

## 2023-01-25 ENCOUNTER — Ambulatory Visit
Admission: RE | Admit: 2023-01-25 | Discharge: 2023-01-25 | Disposition: A | Discharge status: Home / Self Care | Payer: Medicaid Other | Source: Ambulatory Visit | Attending: Nurse Practitioner | Admitting: Nurse Practitioner

## 2023-01-25 VITALS — BP 151/87 | HR 71 | Temp 98.1°F | Resp 18

## 2023-01-25 DIAGNOSIS — N898 Other specified noninflammatory disorders of vagina: Secondary | ICD-10-CM | POA: Diagnosis present

## 2023-01-25 DIAGNOSIS — B9689 Other specified bacterial agents as the cause of diseases classified elsewhere: Secondary | ICD-10-CM | POA: Diagnosis not present

## 2023-01-25 DIAGNOSIS — N760 Acute vaginitis: Secondary | ICD-10-CM

## 2023-01-25 DIAGNOSIS — R399 Unspecified symptoms and signs involving the genitourinary system: Secondary | ICD-10-CM | POA: Diagnosis present

## 2023-01-25 LAB — POCT URINALYSIS DIP (MANUAL ENTRY)
Bilirubin, UA: NEGATIVE
Glucose, UA: NEGATIVE mg/dL
Ketones, POC UA: NEGATIVE mg/dL
Leukocytes, UA: NEGATIVE
Nitrite, UA: NEGATIVE
Protein Ur, POC: NEGATIVE mg/dL
Spec Grav, UA: 1.02 (ref 1.010–1.025)
Urobilinogen, UA: 1 U/dL
pH, UA: 7 (ref 5.0–8.0)

## 2023-01-25 MED ORDER — METRONIDAZOLE 500 MG PO TABS: 500.0000 mg | ORAL_TABLET | Freq: Two times a day (BID) | ORAL | 0 refills | Status: DC

## 2023-01-25 NOTE — ED Provider Notes (Signed)
[facility]    CSN: [id number] Arrival date & time: 01/25/23  0900      History   Chief Complaint Chief Complaint  Patient presents with   URI    UTI  I was given Metro gel last week because I was told it was BV but my symptoms are worse now - Entered by patient   Abdominal Pain   Back Pain   Urinary Frequency   Vaginal Discharge    HPI Melissa Cooley is a 42 y.o. female.   The history is provided by the patient.   Patient presents for complaints of urinary frequency, urgency, low back pain, lower abdominal pain, and vaginal discharge.  Patient states symptoms started over the past 2 to 3 days.  Patient reports she was treated for BV with MetroGel.  States after she began using the MetroGel, she noticed an increased amount of vaginal discharge.  She states the discharge was thick, and there was a lot of it.  Reports 2 or 3 days of use of the MetroGel.  Patient reports the same or similar symptoms in the past with MetroGel, but states at the time that it was prescribed, she did not remember until her symptoms began to recur.  Patient denies fever, chills, chest pain, nausea, vomiting, diarrhea, hematuria, dysuria, or flank pain.  [facility], STI testing was performed, all STIs were negative.  Past Medical History:  Diagnosis Date   Anxiety    Depression    GERD (gastroesophageal reflux disease)    History of gout 2019  approx   History of septic shock 04/20/2020   hospital admission in epic;  acute pyenephritis due to uropathy obstruction from ureter calculus   Hypertension    followed by pcp   Hypokalemia 04/21/2020    Patient Active Problem List   Diagnosis Date Noted   Anxiety and depression 09/09/2022   Normocytic anemia 04/21/2020   Family history of breast cancer in mother 05/11/2017   Obesity (BMI 30-39.9) 04/03/2017   Hypertension 02/20/2017   GERD (gastroesophageal reflux disease) 02/20/2017    Past  Surgical History:  Procedure Laterality Date   CESAREAN SECTION  x2   last one 2011   CYSTOSCOPY WITH RETROGRADE PYELOGRAM, URETEROSCOPY AND STENT PLACEMENT Left 04/20/2020   Procedure: CYSTOSCOPY WITH Left JJ STENT PLACEMENT;  Surgeon: Belva Agee, MD;  Location: [facility];  Service: Urology;  Laterality: Left;   CYSTOSCOPY WITH RETROGRADE PYELOGRAM, URETEROSCOPY AND STENT PLACEMENT Left 05/08/2020   Procedure: CYSTOSCOPY WITH RETROGRADE PYELOGRAM, URETEROSCOPY WITH LASER , AND STENT EXCHANGE;  Surgeon: Belva Agee, MD;  Location: [facility];  Service: Urology;  Laterality: Left;   HOLMIUM LASER APPLICATION Left 05/08/2020   Procedure: HOLMIUM LASER APPLICATION;  Surgeon: Belva Agee, MD;  Location: [facility];  Service: Urology;  Laterality: Left;   LAPAROSCOPIC CHOLECYSTECTOMY  2003   TUBAL LIGATION Bilateral 2017    OB History     Gravida  5   Para  2   Term      Preterm      AB  3   Living  2      SAB      IAB  3   Ectopic      Multiple      Live Births  2            Home Medications    Prior to  Admission medications   Medication Sig Start Date End Date Taking? Authorizing Provider  metroNIDAZOLE (FLAGYL) 500 MG tablet Take 1 tablet (500 mg total) by mouth 2 (two) times daily. 01/25/23  Yes Leath-Warren, Sadie Haber, NP  amLODipine (NORVASC) 10 MG tablet TAKE 1 TABLET BY MOUTH EVERY DAY 10/23/22   Kathleen Lime, MD  Multiple Vitamins-Minerals (WOMENS MULTIVITAMIN PO) Take by mouth.    [provider]  pantoprazole (PROTONIX) 40 MG tablet Take 1 tablet (40 mg total) by mouth daily. 11/27/22   Kathleen Lime, MD  hydrochlorothiazide (MICROZIDE) 12.5 MG capsule Take 1 capsule (12.5 mg total) by mouth daily. Patient not taking: Reported on 04/20/2020 09/05/19 04/23/20  Hoy Register, MD  irbesartan (AVAPRO) 150 MG tablet Take 1 tablet (150 mg total) by mouth daily. Patient not taking: Reported on 04/20/2020 09/05/19  04/23/20  Hoy Register, MD    Family History Family History  Problem Relation Age of Onset   Cancer Mother    Drug abuse Mother    Bipolar disorder Mother    Breast cancer Mother 20   Diabetes Father    Hypertension Other    Anxiety disorder Sister    Drug abuse Brother    Lupus Maternal Aunt     Social History Social History   Tobacco Use   Smoking status: Former    Current packs/day: 0.00    Types: Cigarettes    Start date: 05/03/2000    Quit date: 05/04/2003    Years since quitting: 19.7    Passive exposure: Never   Smokeless tobacco: Never  Vaping Use   Vaping status: Never Used  Substance Use Topics   Alcohol use: No   Drug use: Never     Allergies   Metrogel [metronidazole]   Review of Systems Review of Systems Per HPI  Physical Exam Triage Vital Signs ED Triage Vitals  Encounter Vitals Group     BP 01/25/23 0916 (!) 151/87     Systolic BP Percentile --      Diastolic BP Percentile --      Pulse Rate 01/25/23 0916 71     Resp 01/25/23 0916 18     Temp 01/25/23 0916 98.1 F (36.7 C)     Temp Source 01/25/23 0916 Oral     SpO2 01/25/23 0916 97 %     Weight --      Height --      Head Circumference --      Peak Flow --      Pain Score 01/25/23 0915 8     Pain Loc --      Pain Education --      Exclude from Growth Chart --    No data found.  Updated Vital Signs BP (!) 151/87 (BP Location: Right Arm)   Pulse 71   Temp 98.1 F (36.7 C) (Oral)   Resp 18   LMP 01/13/2023 (Exact Date) Comment: history of tubal ligation  SpO2 97%   Visual Acuity Right Eye Distance:   Left Eye Distance:   Bilateral Distance:    Right Eye Near:   Left Eye Near:    Bilateral Near:     Physical Exam Vitals and nursing note reviewed.  Constitutional:      General: She is not in acute distress.    Appearance: She is well-developed.  HENT:     Head: Normocephalic.  Eyes:     Extraocular Movements: Extraocular movements intact.     Pupils: Pupils are  equal,  round, and reactive to light.  Cardiovascular:     Rate and Rhythm: Regular rhythm.     Pulses: Normal pulses.     Heart sounds: Normal heart sounds.  Pulmonary:     Effort: Pulmonary effort is normal. No respiratory distress.     Breath sounds: Normal breath sounds. No stridor. No wheezing, rhonchi or rales.  Abdominal:     General: Bowel sounds are normal.     Palpations: Abdomen is soft.     Tenderness: There is no abdominal tenderness.  Musculoskeletal:     Cervical back: Normal range of motion.  Lymphadenopathy:     Cervical: No cervical adenopathy.  Skin:    General: Skin is warm and dry.  Neurological:     General: No focal deficit present.     Mental Status: She is alert and oriented to person, place, and time.  Psychiatric:        Mood and Affect: Mood normal.        Behavior: Behavior normal.      UC Treatments / Results  Labs (all labs ordered are listed, but only abnormal results are displayed) Labs Reviewed  POCT URINALYSIS DIP (MANUAL ENTRY) - Abnormal; Notable for the following components:      Result Value   Blood, UA trace-intact (*)    All other components within normal limits  URINE CULTURE  CERVICOVAGINAL ANCILLARY ONLY    EKG   Radiology No results found.  Procedures Procedures (including critical care time)  Medications Ordered in UC Medications - No data to display  Initial Impression / Assessment and Plan / UC Course  I have reviewed the triage vital signs and the nursing notes.  Pertinent labs & imaging results that were available during my care of the patient were reviewed by me and considered in my medical decision making (see chart for details).  Urinalysis does not indicate an obvious urinary tract infection.  Urine culture is pending.  Will repeat cytology testing for BV and yeast.  Since patient has had possible adverse reactions to the MetroGel, we will start her on metronidazole 500 mg twice daily.  Supportive care  recommendations were provided and discussed with the patient to include refraining from sexual intercourse until symptoms improve and condom use which each sexual encounter.  Patient was advised to follow-up with gynecology if symptoms fail to improve after this treatment.  Patient is in agreement with this plan of care and verbalized understanding.  All questions were answered.  Patient stable for discharge.  Final Clinical Impressions(s) / UC Diagnoses   Final diagnoses:  Urinary tract infection symptoms  Vaginal discharge  Bacterial vaginosis     Discharge Instructions      Urine culture and cytology swab are pending.  You will be contacted if the pending test results are abnormal.  You also access to your results via MyChart. Take medication as prescribed. Refrain from sexual intercourse until symptoms improve. Condom use with each sexual encounter. If symptoms do not improve with this treatment, please follow-up with gynecology for further evaluation. Follow-up as needed.     ED Prescriptions     Medication Sig Dispense Auth. Provider   metroNIDAZOLE (FLAGYL) 500 MG tablet Take 1 tablet (500 mg total) by mouth 2 (two) times daily. 14 tablet Leath-Warren, Sadie Haber, NP      PDMP not reviewed this encounter.   Abran Cantor, NP 01/25/23 1018

## 2023-01-25 NOTE — ED Triage Notes (Addendum)
Pt reports she as recently treated for BV with metro gel and states she is now having urinary frequency/ urgency, lower back pain, lower abd pain, vaginal discharge (white), and no odor.  Started: Thursday   Home interventions: none recently

## 2023-01-25 NOTE — Discharge Instructions (Signed)
Urine culture and cytology swab are pending.  You will be contacted if the pending test results are abnormal.  You also access to your results via MyChart. Take medication as prescribed. Refrain from sexual intercourse until symptoms improve. Condom use with each sexual encounter. If symptoms do not improve with this treatment, please follow-up with gynecology for further evaluation. Follow-up as needed.

## 2023-01-26 ENCOUNTER — Telehealth (HOSPITAL_BASED_OUTPATIENT_CLINIC_OR_DEPARTMENT_OTHER): Payer: Self-pay

## 2023-01-26 LAB — URINE CULTURE: Culture: NO GROWTH

## 2023-01-26 LAB — CERVICOVAGINAL ANCILLARY ONLY
Bacterial Vaginitis (gardnerella): NEGATIVE
Candida Glabrata: POSITIVE — AB
Candida Vaginitis: POSITIVE — AB
Comment: NEGATIVE
Comment: NEGATIVE
Comment: NEGATIVE

## 2023-01-26 MED ORDER — FLUCONAZOLE 150 MG PO TABS: 150.0000 mg | ORAL_TABLET | Freq: Once | ORAL | 0 refills | Status: AC

## 2023-01-26 NOTE — Telephone Encounter (Signed)
 Per protocol, pt requires tx with Diflucan. Reviewed with patient, verified pharmacy, prescription sent.

## 2023-01-30 ENCOUNTER — Ambulatory Visit (HOSPITAL_COMMUNITY): Payer: Self-pay

## 2023-02-03 ENCOUNTER — Ambulatory Visit (HOSPITAL_COMMUNITY): Payer: Medicaid Other

## 2023-02-18 ENCOUNTER — Ambulatory Visit: Payer: Medicaid Other | Admitting: Student

## 2023-02-18 VITALS — BP 134/68 | HR 73 | Temp 97.9°F | Ht 71.0 in | Wt 252.4 lb

## 2023-02-18 DIAGNOSIS — F32A Depression, unspecified: Secondary | ICD-10-CM

## 2023-02-18 DIAGNOSIS — K219 Gastro-esophageal reflux disease without esophagitis: Secondary | ICD-10-CM | POA: Diagnosis not present

## 2023-02-18 DIAGNOSIS — I10 Essential (primary) hypertension: Secondary | ICD-10-CM

## 2023-02-18 DIAGNOSIS — Z6835 Body mass index (BMI) 35.0-35.9, adult: Secondary | ICD-10-CM | POA: Diagnosis not present

## 2023-02-18 DIAGNOSIS — L219 Seborrheic dermatitis, unspecified: Secondary | ICD-10-CM | POA: Diagnosis not present

## 2023-02-18 DIAGNOSIS — R002 Palpitations: Secondary | ICD-10-CM | POA: Diagnosis not present

## 2023-02-18 DIAGNOSIS — E669 Obesity, unspecified: Secondary | ICD-10-CM

## 2023-02-18 DIAGNOSIS — F419 Anxiety disorder, unspecified: Secondary | ICD-10-CM

## 2023-02-18 MED ORDER — IRBESARTAN 150 MG PO TABS: 150.0000 mg | ORAL_TABLET | Freq: Every day | ORAL | 1 refills | Status: DC

## 2023-02-18 MED ORDER — KETOCONAZOLE 2 % EX SHAM: 1.0000 | MEDICATED_SHAMPOO | CUTANEOUS | 2 refills | Status: DC

## 2023-02-18 MED ORDER — PANTOPRAZOLE SODIUM 40 MG PO TBEC: 40.0000 mg | DELAYED_RELEASE_TABLET | Freq: Every day | ORAL | 2 refills | Status: DC

## 2023-02-18 NOTE — Patient Instructions (Addendum)
Thank you, Melissa Cooley for allowing [place] to provide your care today. Today we discussed your general health.  We discussed your high blood pressure, acid reflux, anxiety, and your itchy scalp as well as your palpitations. -I would get a thyroid labs today to see if they are normal or not -I would also screen you for diabetes -I will also get her blood work to check on your cholesterol levels -I am also continuing you on the Protonix for your acid reflux for now.  Please let us know how you do with that. -Blood pressure continues to remain elevated, I am restarting your Irbesartan 150 mg.  -For your itchy scalp, I have represcribed your ketoconazole 2% shampoo.   I have ordered the following labs for you:  Lab Orders         TSH         Lipid Profile         Hemoglobin A1c       Tests ordered today:  Referrals ordered today:   Referral Orders  No referral(s) requested today     I have ordered the following medication/changed the following medications:   Stop the following medications: Medications Discontinued During This Encounter  Medication Reason   pantoprazole (PROTONIX) 40 MG tablet Reorder     Start the following medications: Meds ordered this encounter  Medications   pantoprazole (PROTONIX) 40 MG tablet    Sig: Take 1 tablet (40 mg total) by mouth daily.    Dispense:  30 tablet    Refill:  2   ketoconazole (NIZORAL) 2 % shampoo    Sig: Apply 1 Application topically 2 (two) times a week.    Dispense:  120 mL    Refill:  2   irbesartan (AVAPRO) 150 MG tablet    Sig: Take 1 tablet (150 mg total) by mouth daily.    Dispense:  90 tablet    Refill:  1     Follow up:  4 weeks    Remember:   Should you have any questions or concerns please call the internal medicine clinic at [phone].    Kathleen Lime, M.D [facility]

## 2023-02-18 NOTE — Progress Notes (Signed)
CC: Follow-up visit   HPI:  [name] is a 42 y.o. female living with a history stated below and presents today for follow-up visit and also discuss other chronic conditions. Please see problem based assessment and plan for additional details.  Past Medical History:  Diagnosis Date   Anxiety    Depression    GERD (gastroesophageal reflux disease)    History of gout 2019  approx   History of septic shock 04/20/2020   hospital admission in epic;  acute pyenephritis due to uropathy obstruction from ureter calculus   Hypertension    followed by pcp   Hypokalemia 04/21/2020    Current Outpatient Medications on File Prior to Visit  Medication Sig Dispense Refill   amLODipine (NORVASC) 10 MG tablet TAKE 1 TABLET BY MOUTH EVERY DAY 90 tablet 1   metroNIDAZOLE (FLAGYL) 500 MG tablet Take 1 tablet (500 mg total) by mouth 2 (two) times daily. 14 tablet 0   Multiple Vitamins-Minerals (WOMENS MULTIVITAMIN PO) Take by mouth.     [DISCONTINUED] hydrochlorothiazide (MICROZIDE) 12.5 MG capsule Take 1 capsule (12.5 mg total) by mouth daily. (Patient not taking: Reported on 04/20/2020) 90 capsule 1   No current facility-administered medications on file prior to visit.    Family History  Problem Relation Age of Onset   Cancer Mother    Drug abuse Mother    Bipolar disorder Mother    Breast cancer Mother 50   Diabetes Father    Hypertension Other    Anxiety disorder Sister    Drug abuse Brother    Lupus Maternal Aunt     Social History   Socioeconomic History   Marital status: Single    Spouse name: Not on file   Number of children: Not on file   Years of education: Not on file   Highest education level: Not on file  Occupational History   Not on file  Tobacco Use   Smoking status: Former    Current packs/day: 0.00    Types: Cigarettes    Start date: 05/03/2000    Quit date: 05/04/2003    Years since quitting: 19.8    Passive exposure: Never   Smokeless tobacco: Never   Vaping Use   Vaping status: Never Used  Substance and Sexual Activity   Alcohol use: No   Drug use: Never   Sexual activity: Yes    Birth control/protection: Surgical  Other Topics Concern   Not on file  Social History Narrative   Not on file   Social Determinants of Health   Financial Resource Strain: Not on file  Food Insecurity: No Food Insecurity (09/08/2022)   Hunger Vital Sign    Worried About Running Out of Food in the Last Year: Never true    Ran Out of Food in the Last Year: Never true  Transportation Needs: Unmet Transportation Needs (09/08/2022)   PRAPARE - Administrator, Civil Service (Medical): Yes    Lack of Transportation (Non-Medical): Yes  Physical Activity: Not on file  Stress: Not on file  Social Connections: Not on file  Intimate Partner Violence: Not on file    Review of Systems: ROS negative except for what is noted on the assessment and plan.  Vitals:   02/18/23 1545  BP: 134/68  Pulse: 73  Temp: 97.9 F (36.6 C)  TempSrc: Oral  SpO2: 100%  Weight: 252 lb 6.4 oz (114.5 kg)  Height: 5\' 11"  (1.803 m)    Physical Exam:  Constitutional: Obese appearing woman sitting in chair, in no acute distress Cardiovascular: regular rate and rhythm, no m/r/g Pulmonary/Chest: normal work of breathing on room air, lungs clear to auscultation bilaterally MSK: Ecchymosis noted at medial right knee Neurological: alert & oriented x 3, no focal deficit Skin: warm and dry Psych: normal mood and behavior  Assessment & Plan:   Essential hypertension Patient has a history of hypertension.  Blood pressure in the office today is 134/68.  She only takes amlodipine 10 mg daily.  Patient used to be on ARB's and HCTZ but reports these were discontinued when she was hospitalized for sepsis and were not restarted on discharge.  Because her blood pressure is not at goal at this time, I would restart irbesartan 150 mg daily and follow-up  in 4 weeks to recheck her  BMP and blood pressure. -Continue  amlodipine 10 mg daily -Prescribe irbesartan 150 mg daily -Follow-up in 4 weeks for a BMP blood pressure check  GERD (gastroesophageal reflux disease) History of GERD, previously being managed with 40 mg Protonix.   -Restart Protonix 40 mg  -Follow-up in 4 weeks to assess her symptoms  Anxiety and depression Patient has a history of anxiety, used to take buspirone but has not been taking it for quite some time now.  Patient reports she is doing well with therapy at behavioral health,not amenable to medical therapy for anxiety at this time and will continue to use services from a behavioral health to control her anxiety and depression. -Continue behavioral therapy  Palpitations Patient presented to the clinic for follow-up visit but expresses concerns of chest palpitations for the past couple of weeks.  She is  also reporting associated epigastric pain that radiates to the back. Patient has not noticed anything that makes it worse or better.  Differentials  includes cardiac arrhythmias such as A-fib or a-flutter versus premature ventricular contractions or ventricular tachycardia.  Non cardiac etiologies can also include hypothyroidism versus hypoglycemia versus anxiety versus stress .  To further assess this new onset of palpitations, will get a TSH, lipid panel, and also screen the patient for diabetes.  If these labs and tests come back negative, I will consider referring the patient to cardiology for further workup. -TSH -Lipid panel -Hemoglobin A1c  Obesity (BMI 30-39.9) BMI of 35.2.  Has never been screened for diabetes.  Patient reports she is watching her diet and trying to incorporate tolerable exercise when she is able to. - Continue counseling on DASH diet and lifestyle modification.  Seborrheic dermatitis of the scalp Patient reports she has been having yellow flakes and itchy scalp for a long time.  Reports it was managed with ketoconazole 2%  shampoo in the past that was very helpful.  She reported resorting to an over-the-counter shampoo which was somewhat helpful . -Prescribed ketoconazole 2% shampoo    Patient seen with Dr. Rip Harbour, M.D [facility] Phone: [phone] Date 02/18/2023 Time 5:01 PM

## 2023-02-18 NOTE — Assessment & Plan Note (Signed)
Patient has a history of anxiety, used to take buspirone but has not been taking it for quite some time now.  Patient reports she is doing well with therapy at behavioral health.  Patient says she does not need any medical therapy for anxiety at this time and will continue to use services from a behavioral health to control her anxiety and depression. -Continue behavioral therapy

## 2023-02-18 NOTE — Assessment & Plan Note (Signed)
Patient presented to the clinic for follow-up visit but expresses concerns of chest palpitations for the past couple of weeks.  She also reporting associated epigastric pain that radiates to the back.  Patient has not noticed anything that makes it worse or better.  Differentials  includes cardiac arrhythmias such as A-fib or a-flutter versus premature ventricular contractions or ventricular tachycardia.  Known cardiac etiologies can also include hypothyroidism versus hypoglycemia versus anxiety versus stress .  To further assess this new onset of palpitations in this patient, I will get a TSH, lipid panel, and also screen the patient for diabetes. -TSH -Lipid panel -Hemoglobin A1c

## 2023-02-18 NOTE — Assessment & Plan Note (Signed)
History of GERD, previously being managed with 40 mg Protonix.  Patient reports has run out of her medications and start report acid reflux after meals. -Restart Protonix 40 mg for 4 weeks. -Follow-up in 4 weeks to assess her symptoms

## 2023-02-18 NOTE — Assessment & Plan Note (Signed)
Patient has a history of hypertension.  Blood pressure in the office today is 134/68.  She only takes amlodipine 10 mg daily.  Patient needs to be on ARB's and HCTZ but reports these were discontinued when she was hospitalized for sepsis.  These medications were not restarted on discharge.  Because her blood pressure is not at goal at this time, I would restart irbesartan 150 mg daily and follow-up with the patient in 4 weeks to recheck her BMP and blood pressure. -Continue on amlodipine 10 mg daily -Prescribed irbesartan 150 mg daily -Follow-up in 4 weeks for a BMP blood pressure check

## 2023-02-18 NOTE — Assessment & Plan Note (Signed)
BMI of 35.2.  Has never been screened for diabetes.  Patient reports she is watching her diet and trying to incorporate tolerable exercise when she is able to. - Continue counseling on DASH diet and lifestyle modification.

## 2023-02-19 LAB — LIPID PANEL
Chol/HDL Ratio: 3.4 {ratio} (ref 0.0–4.4)
Cholesterol, Total: 181 mg/dL (ref 100–199)
HDL: 54 mg/dL (ref >39)
LDL Chol Calc (NIH): 104 mg/dL — ABNORMAL HIGH (ref 0–99)
Triglycerides: 128 mg/dL (ref 0–149)
VLDL Cholesterol Cal: 23 mg/dL (ref 5–40)

## 2023-02-19 LAB — TSH: TSH: 1.43 u[IU]/mL (ref 0.450–4.500)

## 2023-02-19 LAB — HEMOGLOBIN A1C
Est. average glucose Bld gHb Est-mCnc: 120 mg/dL
Hgb A1c MFr Bld: 5.8 % — ABNORMAL HIGH (ref 4.8–5.6)

## 2023-02-25 NOTE — Progress Notes (Addendum)
Internal Medicine Clinic Attending  I saw and evaluated the patient.  I personally confirmed the key portions of the history and exam documented by Dr.  Mickie Bail  and I reviewed pertinent patient test results.  The assessment, diagnosis, and plan were formulated together and I agree with the documentation in the resident's note. Cardiac exam with regular rate and rhythm. Symptoms of epigastric pain with worsening reflux symptoms may be consistent with GERD, restart PPI.

## 2023-03-24 ENCOUNTER — Encounter: Payer: Medicaid Other | Admitting: Student

## 2023-03-30 ENCOUNTER — Ambulatory Visit: Payer: Medicaid Other | Admitting: Obstetrics and Gynecology

## 2023-03-30 ENCOUNTER — Other Ambulatory Visit (HOSPITAL_COMMUNITY)
Admission: RE | Admit: 2023-03-30 | Discharge: 2023-03-30 | Disposition: A | Discharge status: Home / Self Care | Payer: Medicaid Other | Source: Ambulatory Visit | Attending: Obstetrics and Gynecology | Admitting: Obstetrics and Gynecology

## 2023-03-30 ENCOUNTER — Other Ambulatory Visit: Payer: Self-pay

## 2023-03-30 VITALS — BP 135/84 | HR 68 | Ht 71.0 in | Wt 250.8 lb

## 2023-03-30 DIAGNOSIS — Z124 Encounter for screening for malignant neoplasm of cervix: Secondary | ICD-10-CM

## 2023-03-30 DIAGNOSIS — Z1239 Encounter for other screening for malignant neoplasm of breast: Secondary | ICD-10-CM

## 2023-03-30 DIAGNOSIS — Z01419 Encounter for gynecological examination (general) (routine) without abnormal findings: Secondary | ICD-10-CM

## 2023-03-30 NOTE — Progress Notes (Signed)
 ANNUAL EXAM Patient name: Melissa Cooley MRN [id number]  Date of birth: [birth year] Chief Complaint:   Gynecologic Exam  History of Present Illness:   Melissa Cooley is a 43 y.o. [id number]  female being seen today for a routine annual exam.  Current complaints: s/p BTL in 2017, obtains routine mammograms d/t family hx breast cancer. Normal cycles. No vaginal or pelvic complaints   Patient's last menstrual period was 02/28/2023 (exact date).   The pregnancy intention screening data noted above was reviewed. Potential methods of contraception were discussed. The patient elected to proceed with BTL 2017  Last pap 2021. Results were: NILM. H/O abnormal pap: yes 2017 Last mammogram: 2023. Results were: N/A. Family h/o breast cancer: yes mother was 45      02/18/2023    4:38 PM 12/22/2022    9:33 AM 09/09/2022    5:01 PM 09/08/2022    4:23 PM 03/19/2021   10:07 AM  Depression screen PHQ 2/9  Decreased Interest 3  3 1 1   Down, Depressed, Hopeless 3  3 1 1   PHQ - 2 Score 6  6 2 2   Altered sleeping 3  3 2 1   Tired, decreased energy 3  3 2 1   Change in appetite 2  3 2  0  Feeling bad or failure about yourself  2  3 2 1   Trouble concentrating 3  3 2 1   Moving slowly or fidgety/restless 3  3 2 1   Suicidal thoughts 0  0 0 1  PHQ-9 Score 22  24 14 8   Difficult doing work/chores Somewhat difficult  Somewhat difficult       Information is confidential and restricted. Go to Review Flowsheets to unlock data.        02/18/2023    4:38 PM 12/22/2022    9:32 AM 09/09/2022    5:02 PM 09/08/2022    4:23 PM  GAD 7 : Generalized Anxiety Score  Nervous, Anxious, on Edge 3  3 2   Control/stop worrying 3  3 3   Worry too much - different things 3  3 3   Trouble relaxing 3  3 2   Restless 3  3 3   Easily annoyed or irritable 3  3 3   Afraid - awful might happen 3  3 3   Total GAD 7 Score 21  21 19   Anxiety Difficulty Somewhat difficult  Somewhat difficult      Information is confidential and restricted. Go  to Review Flowsheets to unlock data.     Review of Systems:   Pertinent items are noted in HPI Denies any headaches, blurred vision, fatigue, shortness of breath, chest pain, abdominal pain, abnormal vaginal discharge/itching/odor/irritation, problems with periods, bowel movements, urination, or intercourse unless otherwise stated above. Pertinent History Reviewed:  Reviewed past medical,surgical, social and family history.  Reviewed problem list, medications and allergies. Physical Assessment:   Vitals:   03/30/23 1603 03/30/23 1633  BP: (!) 150/78 135/84  Pulse: 66 68  Weight: 250 lb 12.8 oz (113.8 kg)   Height: 5' 11 (1.803 m)   Body mass index is 34.98 kg/m.        Physical Examination:   General appearance - well appearing, and in no distress  Mental status - alert, oriented to person, place, and time  Psych:  She has a normal mood and affect  Skin - warm and dry, normal color  Chest - effort normal  Heart - normal rate and regular rhythm  Neck:  midline trachea, no thyromegaly  or nodules  Breasts - breasts appear normal, no suspicious masses, no skin or nipple changes or  axillary nodes  Abdomen - soft  Pelvic - VULVA: normal appearing vulva with no masses, tenderness or lesions  VAGINA: normal appearing vagina with normal color and discharge, no lesions  CERVIX: normal appearing cervix without discharge or lesions, no CMT  Thin prep pap is done w HR HPV cotesting  UTERUS: uterus is felt to be normal size, shape, consistency and nontender   ADNEXA: No adnexal masses or tenderness noted.    Extremities:  No swelling or varicosities noted  Chaperone present for exam  No results found for this or any previous visit (from the past 24 hours).  Assessment & Plan:  1. Well woman exam (Primary) Discussed ASCCP guidelines, UTD on pap, desires pap today Continue care with PCP Continue self breast exam, ordered mammogram today 2. Cervical cancer screening  - Cytology -  PAP( [employer])  3. Breast screening Continue routine screening  - MM 3D SCREENING MAMMOGRAM BILATERAL BREAST; Future   Labs/procedures today:   Mammogram: schedule screening mammo as soon as possible, or sooner if problems   Orders Placed This Encounter  Procedures   MM 3D SCREENING MAMMOGRAM BILATERAL BREAST    Meds: No orders of the defined types were placed in this encounter.   Follow-up: Return in about 1 year (around 03/29/2024) for Melissa Cooley Melissa Delores, FNP

## 2023-04-01 LAB — CYTOLOGY - PAP
Adequacy: ABSENT
Comment: NEGATIVE
Diagnosis: NEGATIVE
High risk HPV: NEGATIVE

## 2023-04-02 ENCOUNTER — Ambulatory Visit
Admission: RE | Admit: 2023-04-02 | Discharge: 2023-04-02 | Disposition: A | Discharge status: Home / Self Care | Payer: Medicaid Other | Source: Ambulatory Visit | Attending: Obstetrics and Gynecology | Admitting: Obstetrics and Gynecology

## 2023-04-02 DIAGNOSIS — Z1239 Encounter for other screening for malignant neoplasm of breast: Secondary | ICD-10-CM

## 2023-04-07 ENCOUNTER — Other Ambulatory Visit: Payer: Self-pay | Admitting: Student

## 2023-04-07 ENCOUNTER — Telehealth: Payer: Self-pay | Admitting: Student

## 2023-04-07 DIAGNOSIS — Z1231 Encounter for screening mammogram for malignant neoplasm of breast: Secondary | ICD-10-CM

## 2023-04-07 NOTE — Telephone Encounter (Signed)
Rec'd a message from  the patient that she is now overdue for her yearly mammogram Screening.  MM 3D SCREEN BREAST BILATERAL  LV for a Mammogram was 02/17/2022.  Can a New order be placed for the patient as she was due 02/2023.?

## 2023-04-15 ENCOUNTER — Ambulatory Visit: Payer: Self-pay

## 2023-04-16 ENCOUNTER — Other Ambulatory Visit: Payer: Self-pay | Admitting: Student

## 2023-04-16 DIAGNOSIS — Z803 Family history of malignant neoplasm of breast: Secondary | ICD-10-CM

## 2023-04-16 NOTE — Telephone Encounter (Signed)
Pt called her referral was placed as a order instead of referral for [facility] and she is freaking out due to they had found a lump in her breast .. Marland Kitchen..

## 2023-04-17 ENCOUNTER — Other Ambulatory Visit: Payer: Self-pay | Admitting: Student

## 2023-04-17 DIAGNOSIS — Z803 Family history of malignant neoplasm of breast: Secondary | ICD-10-CM

## 2023-04-21 ENCOUNTER — Ambulatory Visit
Admission: RE | Admit: 2023-04-21 | Discharge: 2023-04-21 | Disposition: A | Discharge status: Home / Self Care | Payer: Medicaid Other | Source: Ambulatory Visit | Attending: Internal Medicine | Admitting: Internal Medicine

## 2023-04-21 ENCOUNTER — Other Ambulatory Visit: Payer: Self-pay | Admitting: Student

## 2023-04-21 ENCOUNTER — Ambulatory Visit
Admission: RE | Admit: 2023-04-21 | Discharge: 2023-04-21 | Discharge status: Home / Self Care | Payer: Medicaid Other | Source: Ambulatory Visit | Attending: Internal Medicine

## 2023-04-21 DIAGNOSIS — Z803 Family history of malignant neoplasm of breast: Secondary | ICD-10-CM

## 2023-04-21 DIAGNOSIS — N6310 Unspecified lump in the right breast, unspecified quadrant: Secondary | ICD-10-CM

## 2023-04-22 ENCOUNTER — Encounter: Payer: Self-pay | Admitting: Student

## 2023-04-27 ENCOUNTER — Encounter: Payer: Medicaid Other | Admitting: Student

## 2023-05-18 ENCOUNTER — Other Ambulatory Visit: Payer: Self-pay | Admitting: Student

## 2023-05-18 NOTE — Telephone Encounter (Signed)
 Medication sent to pharmacy

## 2023-06-04 ENCOUNTER — Ambulatory Visit
Admission: EM | Admit: 2023-06-04 | Discharge: 2023-06-04 | Disposition: A | Discharge status: Home / Self Care | Attending: Nurse Practitioner | Admitting: Nurse Practitioner

## 2023-06-04 DIAGNOSIS — R399 Unspecified symptoms and signs involving the genitourinary system: Secondary | ICD-10-CM

## 2023-06-04 LAB — POCT URINALYSIS DIP (MANUAL ENTRY)
Bilirubin, UA: NEGATIVE
Glucose, UA: NEGATIVE mg/dL
Ketones, POC UA: NEGATIVE mg/dL
Nitrite, UA: NEGATIVE
Protein Ur, POC: NEGATIVE mg/dL
Spec Grav, UA: 1.015
Urobilinogen, UA: 0.2 U/dL
pH, UA: 5

## 2023-06-04 MED ORDER — SULFAMETHOXAZOLE-TRIMETHOPRIM 800-160 MG PO TABS: 1.0000 | ORAL_TABLET | Freq: Two times a day (BID) | ORAL | 0 refills | Status: AC

## 2023-06-04 NOTE — Discharge Instructions (Signed)
 Urine culture is pending.  You will be contacted if the pending test result shows that you do not have a UTI or if the medication you are taking currently needs to be changed.  You also access to results via MyChart. May take over-the-counter Tylenol or ibuprofen as needed for pain, fever, or general discomfort. Increase your fluids.  Make sure you are drinking at least 8-10 8 ounce glasses of water daily. Develop a toileting schedule that will allow you to urinate at least every 2 hours. Avoid caffeine such as tea, soda, coffee while symptoms persist. If your urine culture result is negative and you are continue to experience symptoms, please follow-up with your primary care physician for further evaluation. Follow-up as needed.

## 2023-06-04 NOTE — ED Triage Notes (Signed)
 Pt reports she has some low back back pain, foul smelling odor, "smoky color urine", urgency, and frequent urination x 5 days after engaging in sexual activities.

## 2023-06-04 NOTE — ED Provider Notes (Signed)
 [facility]    CSN: [id number] Arrival date & time: 06/04/23  1723      History   Chief Complaint No chief complaint on file.   HPI Melissa Cooley is a 43 y.o. female.   The history is provided by the patient.   Patient presents with a 5-day history of low back pain, foul-smelling urine, smoky colored urine, urinary urgency and frequency.  She denies fever, states she did have chills, further denies dysuria, hematuria, decreased urine stream, vaginal odor, vaginal discharge, or vaginal itching.  States that she engaged in sexual activities prior to her symptoms starting.  States that she did take 2 Flagyl that she got from a family member.  Denies history of recurrent UTIs.  Patient declines STI testing.  Past Medical History:  Diagnosis Date   Anxiety    Depression    GERD (gastroesophageal reflux disease)    History of gout 2019  approx   History of septic shock 04/20/2020   hospital admission in epic;  acute pyenephritis due to uropathy obstruction from ureter calculus   Hypertension    followed by pcp   Hypokalemia 04/21/2020    Patient Active Problem List   Diagnosis Date Noted   Palpitations 02/18/2023   Anxiety and depression 09/09/2022   Normocytic anemia 04/21/2020   Family history of breast cancer in mother 05/11/2017   Obesity (BMI 30-39.9) 04/03/2017   Essential hypertension 02/20/2017   GERD (gastroesophageal reflux disease) 02/20/2017    Past Surgical History:  Procedure Laterality Date   CESAREAN SECTION  x2   last one 2011   CYSTOSCOPY WITH RETROGRADE PYELOGRAM, URETEROSCOPY AND STENT PLACEMENT Left 04/20/2020   Procedure: CYSTOSCOPY WITH Left JJ STENT PLACEMENT;  Surgeon: Belva Agee, MD;  Location: [facility];  Service: Urology;  Laterality: Left;   CYSTOSCOPY WITH RETROGRADE PYELOGRAM, URETEROSCOPY AND STENT PLACEMENT Left 05/08/2020   Procedure: CYSTOSCOPY WITH RETROGRADE PYELOGRAM, URETEROSCOPY WITH LASER , AND STENT EXCHANGE;   Surgeon: Belva Agee, MD;  Location: [facility];  Service: Urology;  Laterality: Left;   HOLMIUM LASER APPLICATION Left 05/08/2020   Procedure: HOLMIUM LASER APPLICATION;  Surgeon: Belva Agee, MD;  Location[facility];  Service: Urology;  Laterality: Left;   LAPAROSCOPIC CHOLECYSTECTOMY  2003   TUBAL LIGATION Bilateral 2017    OB History     Gravida  5   Para  2   Term      Preterm      AB  3   Living  2      SAB      IAB  3   Ectopic      Multiple      Live Births  2            Home Medications    Prior to Admission medications   Medication Sig Start Date End Date Taking? Authorizing Provider  sulfamethoxazole-trimethoprim (BACTRIM DS) 800-160 MG tablet Take 1 tablet by mouth 2 (two) times daily for 7 days. 06/04/23 06/11/23 Yes Leath-Warren, Sadie Haber, NP  amLODipine (NORVASC) 10 MG tablet TAKE 1 TABLET BY MOUTH EVERY DAY 10/23/22   Kathleen Lime, MD  irbesartan (AVAPRO) 150 MG tablet Take 1 tablet (150 mg total) by mouth daily. 02/18/23   Kathleen Lime, MD  ketoconazole (NIZORAL) 2 % shampoo Apply 1 Application topically 2 (two) times a week. 02/19/23   Kathleen Lime, MD  Multiple Vitamins-Minerals (WOMENS MULTIVITAMIN PO) Take by mouth.  [provider]  pantoprazole (PROTONIX) 40 MG tablet TAKE 1 TABLET BY MOUTH EVERY DAY 05/18/23   Kathleen Lime, MD  hydrochlorothiazide (MICROZIDE) 12.5 MG capsule Take 1 capsule (12.5 mg total) by mouth daily. Patient not taking: Reported on 04/20/2020 09/05/19 04/23/20  Hoy Register, MD    Family History Family History  Problem Relation Age of Onset   Cancer Mother    Drug abuse Mother    Bipolar disorder Mother    Breast cancer Mother 49   Diabetes Father    Hypertension Other    Anxiety disorder Sister    Drug abuse Brother    Lupus Maternal Aunt     Social History Social History   Tobacco Use   Smoking status: Former    Current packs/day: 0.00     Types: Cigarettes    Start date: 05/03/2000    Quit date: 05/04/2003    Years since quitting: 20.0    Passive exposure: Never   Smokeless tobacco: Never  Vaping Use   Vaping status: Never Used  Substance Use Topics   Alcohol use: No   Drug use: Never     Allergies   Metrogel [metronidazole]   Review of Systems Review of Systems Per HPI  Physical Exam Triage Vital Signs ED Triage Vitals  Encounter Vitals Group     BP 06/04/23 1730 134/83     Systolic BP Percentile --      Diastolic BP Percentile --      Pulse Rate 06/04/23 1730 76     Resp 06/04/23 1730 18     Temp 06/04/23 1730 98 F (36.7 C)     Temp Source 06/04/23 1730 Oral     SpO2 06/04/23 1730 97 %     Weight --      Height --      Head Circumference --      Peak Flow --      Pain Score 06/04/23 1732 10     Pain Loc --      Pain Education --      Exclude from Growth Chart --    No data found.  Updated Vital Signs BP 134/83 (BP Location: Right Arm)   Pulse 76   Temp 98 F (36.7 C) (Oral)   Resp 18   LMP 05/07/2023   SpO2 97%   Visual Acuity Right Eye Distance:   Left Eye Distance:   Bilateral Distance:    Right Eye Near:   Left Eye Near:    Bilateral Near:     Physical Exam Vitals and nursing note reviewed.  Constitutional:      General: She is not in acute distress.    Appearance: Normal appearance.  HENT:     Head: Normocephalic.  Eyes:     Pupils: Pupils are equal, round, and reactive to light.  Cardiovascular:     Rate and Rhythm: Normal rate and regular rhythm.     Pulses: Normal pulses.     Heart sounds: Normal heart sounds.  Pulmonary:     Effort: Pulmonary effort is normal.     Breath sounds: Normal breath sounds.  Abdominal:     General: Bowel sounds are normal.     Palpations: Abdomen is soft.     Tenderness: There is abdominal tenderness in the suprapubic area. There is right CVA tenderness and left CVA tenderness.  Musculoskeletal:     Cervical back: Normal range of  motion.  Lymphadenopathy:     Cervical: No  cervical adenopathy.  Skin:    General: Skin is warm and dry.  Neurological:     General: No focal deficit present.     Mental Status: She is alert and oriented to person, place, and time.  Psychiatric:        Mood and Affect: Mood normal.        Behavior: Behavior normal.      UC Treatments / Results  Labs (all labs ordered are listed, but only abnormal results are displayed) Labs Reviewed  POCT URINALYSIS DIP (MANUAL ENTRY) - Abnormal; Notable for the following components:      Result Value   Clarity, UA cloudy (*)    Blood, UA large (*)    Leukocytes, UA Large (3+) (*)    All other components within normal limits  URINE CULTURE    EKG   Radiology No results found.  Procedures Procedures (including critical care time)  Medications Ordered in UC Medications - No data to display  Initial Impression / Assessment and Plan / UC Course  I have reviewed the triage vital signs and the nursing notes.  Pertinent labs & imaging results that were available during my care of the patient were reviewed by me and considered in my medical decision making (see chart for details).  Urinalysis is positive for large leukocytes and blood.  Urine culture is pending.  On exam, patient with suprapubic tenderness and bilateral CVA tenderness.  Given patient's presentation, and exam findings, will treat empirically with Bactrim DS for UTI, possible pyelonephritis.  Supportive care recommendations were provided and discussed with the patient to include fluids, rest, over-the-counter analgesics, developing a toileting schedule, and avoiding caffeine.  Discussed indications for follow-up.  Patient advised to follow-up with her PCP if urine culture result is negative and she is still continuing to experience symptoms.  Patient was in agreement with this plan of care and verbalized understanding.  All questions were answered.  Patient stable for  discharge. Final Clinical Impressions(s) / UC Diagnoses   Final diagnoses:  UTI symptoms     Discharge Instructions      Urine culture is pending.  You will be contacted if the pending test result shows that you do not have a UTI or if the medication you are taking currently needs to be changed.  You also access to results via MyChart. May take over-the-counter Tylenol or ibuprofen as needed for pain, fever, or general discomfort. Increase your fluids.  Make sure you are drinking at least 8-10 8 ounce glasses of water daily. Develop a toileting schedule that will allow you to urinate at least every 2 hours. Avoid caffeine such as tea, soda, coffee while symptoms persist. If your urine culture result is negative and you are continue to experience symptoms, please follow-up with your primary care physician for further evaluation. Follow-up as needed.     ED Prescriptions     Medication Sig Dispense Auth. Provider   sulfamethoxazole-trimethoprim (BACTRIM DS) 800-160 MG tablet Take 1 tablet by mouth 2 (two) times daily for 7 days. 14 tablet Leath-Warren, Sadie Haber, NP      PDMP not reviewed this encounter.   Abran Cantor, NP 06/04/23 1753

## 2023-06-07 LAB — URINE CULTURE: Culture: 60000 — AB

## 2023-06-22 ENCOUNTER — Other Ambulatory Visit: Payer: Self-pay | Admitting: Student

## 2023-06-22 DIAGNOSIS — I10 Essential (primary) hypertension: Secondary | ICD-10-CM

## 2023-06-22 NOTE — Telephone Encounter (Signed)
 Medication sent to pharmacy

## 2023-08-19 ENCOUNTER — Emergency Department (HOSPITAL_BASED_OUTPATIENT_CLINIC_OR_DEPARTMENT_OTHER)
Admission: EM | Admit: 2023-08-19 | Discharge: 2023-08-19 | Disposition: A | Discharge status: Home / Self Care | Attending: Emergency Medicine | Admitting: Emergency Medicine

## 2023-08-19 ENCOUNTER — Encounter (HOSPITAL_BASED_OUTPATIENT_CLINIC_OR_DEPARTMENT_OTHER): Payer: Self-pay

## 2023-08-19 ENCOUNTER — Other Ambulatory Visit: Payer: Self-pay

## 2023-08-19 ENCOUNTER — Emergency Department (HOSPITAL_BASED_OUTPATIENT_CLINIC_OR_DEPARTMENT_OTHER)

## 2023-08-19 DIAGNOSIS — I10 Essential (primary) hypertension: Secondary | ICD-10-CM | POA: Insufficient documentation

## 2023-08-19 DIAGNOSIS — Z87891 Personal history of nicotine dependence: Secondary | ICD-10-CM | POA: Insufficient documentation

## 2023-08-19 DIAGNOSIS — R109 Unspecified abdominal pain: Secondary | ICD-10-CM | POA: Diagnosis present

## 2023-08-19 DIAGNOSIS — R3915 Urgency of urination: Secondary | ICD-10-CM | POA: Insufficient documentation

## 2023-08-19 LAB — CBC
HCT: 40.4 % (ref 36.0–46.0)
Hemoglobin: 13.3 g/dL (ref 12.0–15.0)
MCH: 30.4 pg (ref 26.0–34.0)
MCHC: 32.9 g/dL (ref 30.0–36.0)
MCV: 92.4 fL (ref 80.0–100.0)
Platelets: 408 10*3/uL — ABNORMAL HIGH (ref 150–400)
RBC: 4.37 MIL/uL (ref 3.87–5.11)
RDW: 14.3 % (ref 11.5–15.5)
WBC: 5.8 10*3/uL (ref 4.0–10.5)
nRBC: 0 % (ref 0.0–0.2)

## 2023-08-19 LAB — BASIC METABOLIC PANEL WITH GFR
Anion gap: 12 (ref 5–15)
BUN: 13 mg/dL (ref 6–20)
CO2: 22 mmol/L (ref 22–32)
Calcium: 9.3 mg/dL (ref 8.9–10.3)
Chloride: 107 mmol/L (ref 98–111)
Creatinine, Ser: 0.63 mg/dL (ref 0.44–1.00)
GFR, Estimated: >60 mL/min (ref >60)
Glucose, Bld: 92 mg/dL (ref 70–99)
Potassium: 4 mmol/L (ref 3.5–5.1)
Sodium: 140 mmol/L (ref 135–145)

## 2023-08-19 LAB — URINALYSIS, ROUTINE W REFLEX MICROSCOPIC
Bilirubin Urine: NEGATIVE
Glucose, UA: NEGATIVE mg/dL
Hgb urine dipstick: NEGATIVE
Ketones, ur: NEGATIVE mg/dL
Leukocytes,Ua: NEGATIVE
Nitrite: NEGATIVE
Protein, ur: NEGATIVE mg/dL
Specific Gravity, Urine: 1.014 (ref 1.005–1.030)
pH: 7 (ref 5.0–8.0)

## 2023-08-19 LAB — PREGNANCY, URINE: Preg Test, Ur: NEGATIVE

## 2023-08-19 MED ORDER — CEPHALEXIN 500 MG PO CAPS: 500.0000 mg | ORAL_CAPSULE | Freq: Four times a day (QID) | ORAL | 0 refills | Status: AC

## 2023-08-19 NOTE — ED Provider Notes (Signed)
 [facility] Emergency Department Provider Note MRN:  [id number]  Arrival date & time: 08/19/23     Chief Complaint   Dysuria and Flank Pain   History of Present Illness   Melissa Cooley is a 43 y.o. year-old female with a history of hypertension presenting to the ED with chief complaint of flank pain.  Left flank pain for the past 3 to 4 days, increased urgency of urination.  Also drainage from the bellybutton.  Review of Systems  A thorough review of systems was obtained and all systems are negative except as noted in the HPI and PMH.   Patient's Health History    Past Medical History:  Diagnosis Date   Anxiety    Depression    GERD (gastroesophageal reflux disease)    History of gout 2019  approx   History of septic shock 04/20/2020   hospital admission in epic;  acute pyenephritis due to uropathy obstruction from ureter calculus   Hypertension    followed by pcp   Hypokalemia 04/21/2020    Past Surgical History:  Procedure Laterality Date   CESAREAN SECTION  x2   last one 2011   CYSTOSCOPY WITH RETROGRADE PYELOGRAM, URETEROSCOPY AND STENT PLACEMENT Left 04/20/2020   Procedure: CYSTOSCOPY WITH Left JJ STENT PLACEMENT;  Surgeon: Sherlyn Ditto, MD;  Location: [facility];  Service: Urology;  Laterality: Left;   CYSTOSCOPY WITH RETROGRADE PYELOGRAM, URETEROSCOPY AND STENT PLACEMENT Left 05/08/2020   Procedure: CYSTOSCOPY WITH RETROGRADE PYELOGRAM, URETEROSCOPY WITH LASER , AND STENT EXCHANGE;  Surgeon: Sherlyn Ditto, MD;  Location: [facility];  Service: Urology;  Laterality: Left;   HOLMIUM LASER APPLICATION Left 05/08/2020   Procedure: HOLMIUM LASER APPLICATION;  Surgeon: Sherlyn Ditto, MD;  Location: [facility];  Service: Urology;  Laterality: Left;   LAPAROSCOPIC CHOLECYSTECTOMY  2003   TUBAL LIGATION Bilateral 2017    Family History  Problem Relation Age of Onset   Cancer Mother    Drug abuse Mother     Bipolar disorder Mother    Breast cancer Mother 78   Diabetes Father    Hypertension Other    Anxiety disorder Sister    Drug abuse Brother    Lupus Maternal Aunt     Social History   Socioeconomic History   Marital status: Single    Spouse name: Not on file   Number of children: Not on file   Years of education: Not on file   Highest education level: Not on file  Occupational History   Not on file  Tobacco Use   Smoking status: Former    Current packs/day: 0.00    Types: Cigarettes    Start date: 05/03/2000    Quit date: 05/04/2003    Years since quitting: 20.3    Passive exposure: Never   Smokeless tobacco: Never  Vaping Use   Vaping status: Never Used  Substance and Sexual Activity   Alcohol use: No   Drug use: Never   Sexual activity: Yes    Birth control/protection: Surgical  Other Topics Concern   Not on file  Social History Narrative   Not on file   Social Drivers of Health   Financial Resource Strain: Not on file  Food Insecurity: No Food Insecurity (09/08/2022)   Hunger Vital Sign    Worried About Running Out of Food in the Last Year: Never true    Ran Out of Food in the Last Year: Never true  Transportation  Needs: Unmet Transportation Needs (09/08/2022)   PRAPARE - Administrator, Civil Service (Medical): Yes    Lack of Transportation (Non-Medical): Yes  Physical Activity: Not on file  Stress: Not on file  Social Connections: Not on file  Intimate Partner Violence: Not on file     Physical Exam   Vitals:   08/19/23 0316  BP: (!) 143/86  Pulse: 60  Resp: 16  Temp: 98.6 F (37 C)  SpO2: 100%    CONSTITUTIONAL: Well-appearing, NAD NEURO/PSYCH:  Alert and oriented x 3, no focal deficits EYES:  eyes equal and reactive ENT/NECK:  no LAD, no JVD CARDIO: Regular rate, well-perfused, normal S1 and S2 PULM:  CTAB no wheezing or rhonchi GI/GU:  non-distended, non-tender MSK/SPINE:  No gross deformities, no edema SKIN:  no rash,  atraumatic   *Additional and/or pertinent findings included in MDM below  Diagnostic and Interventional Summary    EKG Interpretation Date/Time:    Ventricular Rate:    PR Interval:    QRS Duration:    QT Interval:    QTC Calculation:   R Axis:      Text Interpretation:         Labs Reviewed  URINALYSIS, ROUTINE W REFLEX MICROSCOPIC - Abnormal; Notable for the following components:      Result Value   Color, Urine COLORLESS (*)    All other components within normal limits  CBC - Abnormal; Notable for the following components:   Platelets 408 (*)    All other components within normal limits  PREGNANCY, URINE  BASIC METABOLIC PANEL WITH GFR    CT Renal Stone Study  Final Result      Medications - No data to display   Procedures  /  Critical Care Procedures  ED Course and Medical Decision Making  Initial Impression and Ddx Differential diagnosis includes kidney stone, pyelonephritis, umbilical abscess.  Past medical/surgical history that increases complexity of ED encounter: History of kidney stones  Interpretation of Diagnostics I personally reviewed the Laboratory Testing and my interpretation is as follows: No significant blood count or electrolyte disturbance.  CT unremarkable  Patient Reassessment and Ultimate Disposition/Management     Discharge  Patient management required discussion with the following services or consulting groups:  None  Complexity of Problems Addressed Acute illness or injury that poses threat of life of bodily function  Additional Data Reviewed and Analyzed Further history obtained from: None  Additional Factors Impacting ED Encounter Risk Prescriptions  Merrick Abe. Harless Lien, MD [facility] Emergency Medicine [facility] [email or web address]  Final Clinical Impressions(s) / ED Diagnoses     ICD-10-CM   1. Flank pain  R10.9       ED Discharge Orders          Ordered    cephALEXin  (KEFLEX ) 500 MG  capsule  4 times daily        08/19/23 0525             Discharge Instructions Discussed with and Provided to Patient:     Discharge Instructions      You were evaluated in the Emergency Department and after careful evaluation, we did not find any emergent condition requiring admission or further testing in the hospital.  Your exam/testing today is overall reassuring.  Recommend use of the Keflex  antibiotic as directed, follow-up with your primary care doctor.  Please return to the Emergency Department if you experience any worsening of your condition.   Thank you for  allowing us  to be a part of your care.     Edson Graces, MD 08/19/23 [phone]

## 2023-08-19 NOTE — ED Triage Notes (Signed)
 Pt reports sharp left flank pain starting 3-4 days ago with dysuria, malodorous/cloudy. Hx of kidney stone with surgery in 2022. Denies fever. Pt also reports abnormal "snotty" discharge from belly button with burning sensation for past week.

## 2023-08-19 NOTE — ED Notes (Signed)
 Patient transported to CT

## 2023-08-19 NOTE — Discharge Instructions (Signed)
 You were evaluated in the Emergency Department and after careful evaluation, we did not find any emergent condition requiring admission or further testing in the hospital.  Your exam/testing today is overall reassuring.  Recommend use of the Keflex  antibiotic as directed, follow-up with your primary care doctor.  Please return to the Emergency Department if you experience any worsening of your condition.   Thank you for allowing us  to be a part of your care.

## 2023-09-10 NOTE — Pre-Procedure Instructions (Signed)
 Surgical Instructions   Your procedure is scheduled on September 22, 2023. Report to [facility] Main Entrance A at 5:30 A.M., then check in with the Admitting office. Any questions or running late day of surgery: call [phone]  Questions prior to your surgery date: call [phone], Monday-Friday, 8am-4pm. If you experience any cold or flu symptoms such as cough, fever, chills, shortness of breath, etc. between now and your scheduled surgery, please notify us  at the above number.     Remember:  Do not eat after midnight the night before your surgery   You may drink clear liquids until 4:30 AM the morning of your surgery.   Clear liquids allowed are: Water, Non-Citrus Juices (without pulp), Carbonated Beverages, Clear Tea (no milk, honey, etc.), Black Coffee Only (NO MILK, CREAM OR POWDERED CREAMER of any kind), and Gatorade.    Take these medicines the morning of surgery with A SIP OF WATER: amLODipine  (NORVASC )  pantoprazole  (PROTONIX )    STOP taking your WEGOVY one week prior to surgery. DO NOT take any doses after June 30th.   One week prior to surgery, STOP taking any Aspirin (unless otherwise instructed by your surgeon) Aleve, Naproxen, Ibuprofen , Motrin , Advil , Goody's, BC's, all herbal medications, fish oil, and non-prescription vitamins.                     Do NOT Smoke (Tobacco/Vaping) for 24 hours prior to your procedure.  If you use a CPAP at night, you may bring your mask/headgear for your overnight stay.   You will be asked to remove any contacts, glasses, piercing's, hearing aid's, dentures/partials prior to surgery. Please bring cases for these items if needed.    Patients discharged the day of surgery will not be allowed to drive home, and someone needs to stay with them for 24 hours.  SURGICAL WAITING ROOM VISITATION Patients may have no more than 2 support people in the waiting area - these visitors may rotate.   Pre-op nurse will coordinate an appropriate  time for 1 ADULT support person, who may not rotate, to accompany patient in pre-op.  Children under the age of 24 must have an adult with them who is not the patient and must remain in the main waiting area with an adult.  If the patient needs to stay at the hospital during part of their recovery, the visitor guidelines for inpatient rooms apply.  Please refer to the [facility] website for the visitor guidelines for any additional information.   If you received a COVID test during your pre-op visit  it is requested that you wear a mask when out in public, stay away from anyone that may not be feeling well and notify your surgeon if you develop symptoms. If you have been in contact with anyone that has tested positive in the last 10 days please notify you surgeon.      Pre-operative CHG Bathing Instructions   You can play a key role in reducing the risk of infection after surgery. Your skin needs to be as free of germs as possible. You can reduce the number of germs on your skin by washing with CHG (chlorhexidine  gluconate) soap before surgery. CHG is an antiseptic soap that kills germs and continues to kill germs even after washing.   DO NOT use if you have an allergy to chlorhexidine /CHG or antibacterial soaps. If your skin becomes reddened or irritated, stop using the CHG and notify one of our RNs at [phone].  TAKE A SHOWER THE NIGHT BEFORE SURGERY AND THE DAY OF SURGERY    Please keep in mind the following:  DO NOT shave, including legs and underarms, 48 hours prior to surgery.   You may shave your face before/day of surgery.  Place clean sheets on your bed the night before surgery Use a clean washcloth (not used since being washed) for each shower. DO NOT sleep with pet's night before surgery.  CHG Shower Instructions:  Wash your face and private area with normal soap. If you choose to wash your hair, wash first with your normal shampoo.  After you use  shampoo/soap, rinse your hair and body thoroughly to remove shampoo/soap residue.  Turn the water OFF and apply half the bottle of CHG soap to a CLEAN washcloth.  Apply CHG soap ONLY FROM YOUR NECK DOWN TO YOUR TOES (washing for 3-5 minutes)  DO NOT use CHG soap on face, private areas, open wounds, or sores.  Pay special attention to the area where your surgery is being performed.  If you are having back surgery, having someone wash your back for you may be helpful. Wait 2 minutes after CHG soap is applied, then you may rinse off the CHG soap.  Pat dry with a clean towel  Put on clean pajamas    Additional instructions for the day of surgery: DO NOT APPLY any lotions, deodorants, cologne, or perfumes.   Do not wear jewelry or makeup Do not wear nail polish, gel polish, artificial nails, or any other type of covering on natural nails (fingers and toes) Do not bring valuables to the hospital. [facility] is not responsible for valuables/personal belongings. Put on clean/comfortable clothes.  Please brush your teeth.  Ask your nurse before applying any prescription medications to the skin.

## 2023-09-11 ENCOUNTER — Encounter (HOSPITAL_COMMUNITY): Payer: Self-pay

## 2023-09-11 ENCOUNTER — Encounter (HOSPITAL_COMMUNITY)
Admission: RE | Admit: 2023-09-11 | Discharge: 2023-09-11 | Disposition: A | Discharge status: Home / Self Care | Source: Ambulatory Visit | Attending: General Surgery | Admitting: General Surgery

## 2023-09-11 ENCOUNTER — Other Ambulatory Visit: Payer: Self-pay

## 2023-09-11 ENCOUNTER — Ambulatory Visit: Payer: Self-pay | Admitting: General Surgery

## 2023-09-11 VITALS — BP 130/82 | HR 69 | Temp 98.0°F | Resp 18 | Ht 71.0 in | Wt 245.2 lb

## 2023-09-11 DIAGNOSIS — I2510 Atherosclerotic heart disease of native coronary artery without angina pectoris: Secondary | ICD-10-CM | POA: Diagnosis not present

## 2023-09-11 DIAGNOSIS — Z01818 Encounter for other preprocedural examination: Secondary | ICD-10-CM | POA: Diagnosis present

## 2023-09-11 HISTORY — DX: Prediabetes: R73.03

## 2023-09-11 LAB — BASIC METABOLIC PANEL WITH GFR
Anion gap: 8 (ref 5–15)
BUN: 9 mg/dL (ref 6–20)
CO2: 25 mmol/L (ref 22–32)
Calcium: 9.1 mg/dL (ref 8.9–10.3)
Chloride: 106 mmol/L (ref 98–111)
Creatinine, Ser: 0.62 mg/dL (ref 0.44–1.00)
GFR, Estimated: >60 mL/min (ref >60)
Glucose, Bld: 90 mg/dL (ref 70–99)
Potassium: 3.5 mmol/L (ref 3.5–5.1)
Sodium: 139 mmol/L (ref 135–145)

## 2023-09-11 LAB — CBC
HCT: 41.1 % (ref 36.0–46.0)
Hemoglobin: 13.6 g/dL (ref 12.0–15.0)
MCH: 30.6 pg (ref 26.0–34.0)
MCHC: 33.1 g/dL (ref 30.0–36.0)
MCV: 92.6 fL (ref 80.0–100.0)
Platelets: 390 10*3/uL (ref 150–400)
RBC: 4.44 MIL/uL (ref 3.87–5.11)
RDW: 14.2 % (ref 11.5–15.5)
WBC: 7 10*3/uL (ref 4.0–10.5)
nRBC: 0 % (ref 0.0–0.2)

## 2023-09-11 NOTE — Progress Notes (Signed)
 PCP - Eleanor Lamer, PA-C Cardiologist - Denies - Pt did see a cardiologist at [facility] a few years ago for palpitations. She was supposed to complete a Zio patch, but broke out from the adhesive tape and never completed it. She did not return to [facility] due to being unhappy with their care. She has had no palpitations since then  PPM/ICD - Denies Device Orders - n/a Rep Notified - n/a  Chest x-ray - n/a EKG - 09/11/2023 Stress Test - Denies ECHO - Denies Cardiac Cath - Denies  Sleep Study - Denies CPAP - n/a  Pt is Pre-DM  Last dose of GLP1 agonist- Last dose of [facility] June 22nd. GLP1 instructions: Pt instructed to NOT take any doses after June 30th.  Blood Thinner Instructions: n/a Aspirin Instructions: n/a  ERAS Protcol - Clear liquids until 0430 morning of surgery PRE-SURGERY Ensure or G2- n/a  COVID TEST- n/a   Anesthesia review: No.   Patient denies shortness of breath, fever, cough and chest pain at PAT appointment. Pt denies any respiratory illness/infection in the last two months.   All instructions explained to the patient, with a verbal understanding of the material. Patient agrees to go over the instructions while at home for a better understanding. Patient also instructed to self quarantine after being tested for COVID-19. The opportunity to ask questions was provided.

## 2023-09-22 ENCOUNTER — Encounter (HOSPITAL_COMMUNITY): Admission: RE | Payer: Self-pay | Source: Home / Self Care

## 2023-09-22 ENCOUNTER — Ambulatory Visit (HOSPITAL_COMMUNITY): Admission: RE | Admit: 2023-09-22 | Source: Home / Self Care | Admitting: General Surgery

## 2023-09-22 SURGERY — EXCISION, UMBILICUS
Anesthesia: Choice

## 2023-10-20 ENCOUNTER — Other Ambulatory Visit: Payer: Medicaid Other

## 2023-11-19 ENCOUNTER — Telehealth: Payer: Self-pay | Admitting: *Deleted

## 2023-11-19 NOTE — Telephone Encounter (Signed)
 Appointment canceled (10/20/2023) for U/S of breast.

## 2024-01-18 ENCOUNTER — Other Ambulatory Visit: Payer: Self-pay

## 2024-01-18 ENCOUNTER — Emergency Department (HOSPITAL_BASED_OUTPATIENT_CLINIC_OR_DEPARTMENT_OTHER)
Admission: EM | Admit: 2024-01-18 | Discharge: 2024-01-18 | Disposition: A | Discharge status: Home / Self Care | Payer: Self-pay | Attending: Emergency Medicine | Admitting: Emergency Medicine

## 2024-01-18 ENCOUNTER — Encounter (HOSPITAL_BASED_OUTPATIENT_CLINIC_OR_DEPARTMENT_OTHER): Payer: Self-pay

## 2024-01-18 DIAGNOSIS — Z79899 Other long term (current) drug therapy: Secondary | ICD-10-CM | POA: Insufficient documentation

## 2024-01-18 DIAGNOSIS — N3000 Acute cystitis without hematuria: Secondary | ICD-10-CM | POA: Insufficient documentation

## 2024-01-18 DIAGNOSIS — I10 Essential (primary) hypertension: Secondary | ICD-10-CM | POA: Insufficient documentation

## 2024-01-18 DIAGNOSIS — Z87442 Personal history of urinary calculi: Secondary | ICD-10-CM | POA: Insufficient documentation

## 2024-01-18 LAB — URINALYSIS, ROUTINE W REFLEX MICROSCOPIC
Bilirubin Urine: NEGATIVE
Glucose, UA: NEGATIVE mg/dL
Hgb urine dipstick: NEGATIVE
Ketones, ur: NEGATIVE mg/dL
Nitrite: NEGATIVE
Protein, ur: NEGATIVE mg/dL
Specific Gravity, Urine: 1.018 (ref 1.005–1.030)
pH: 7.5 (ref 5.0–8.0)

## 2024-01-18 LAB — PREGNANCY, URINE: Preg Test, Ur: NEGATIVE

## 2024-01-18 MED ORDER — CEPHALEXIN 250 MG PO CAPS
500.0000 mg | ORAL_CAPSULE | Freq: Once | ORAL | Status: AC
  Administered 2024-01-18: 500 mg via ORAL
  Filled 2024-01-18: qty 2

## 2024-01-18 MED ORDER — CEPHALEXIN 500 MG PO CAPS: 500.0000 mg | ORAL_CAPSULE | Freq: Three times a day (TID) | ORAL | 0 refills | Status: DC

## 2024-01-18 NOTE — Discharge Instructions (Signed)
 You were seen today with concern for UTI.  Urinalysis does have bacteria and white cells.  Take antibiotics as prescribed.  If you develop fevers, nausea vomiting, worsening pain, you should be reevaluated.

## 2024-01-18 NOTE — ED Triage Notes (Signed)
 PT to exam 13 c/o Dysuria x 1 week with increased frequency and urgency. PT denies fever chills. VSS NAD PT on room air.

## 2024-01-18 NOTE — ED Provider Notes (Signed)
 [place] EMERGENCY DEPARTMENT AT [facility] Provider Note   CSN: [id number] Arrival date & time: 01/18/24  0459     Patient presents with: Dysuria   Melissa Cooley is a 43 y.o. female.   HPI     This is a 43 year old female who presents with dysuria and frequency.  Patient reports 1 week history of worsening suprapubic discomfort, dysuria, and urinary frequency.  She states that when she has to go to the bathroom she feels significant urge but then only dribbles.  Denies fevers.  Has begun to develop some lower back discomfort bilaterally left greater than right.  No chills.  No nausea or vomiting.  Prior to Admission medications   Medication Sig Start Date End Date Taking? Authorizing Provider  cephALEXin  (KEFLEX ) 500 MG capsule Take 1 capsule (500 mg total) by mouth 3 (three) times daily. 01/18/24  Yes Quinnten Calvin, Charmaine FALCON, MD  amLODipine  (NORVASC ) 10 MG tablet TAKE 1 TABLET BY MOUTH EVERY DAY 06/22/23   Amoako, Prince, MD  ascorbic acid (VITAMIN C) 500 MG tablet Take 500 mg by mouth daily.    [provider]  irbesartan  (AVAPRO ) 150 MG tablet TAKE 1 TABLET BY MOUTH EVERY DAY 06/22/23   Amoako, Prince, MD  ketoconazole  (NIZORAL ) 2 % shampoo Apply 1 Application topically 2 (two) times a week. Patient not taking: Reported on 09/08/2023 02/19/23   Amoako, Prince, MD  Multiple Vitamins-Minerals (WOMENS MULTIVITAMIN PO) Take 1 tablet by mouth daily.    [provider]  pantoprazole  (PROTONIX ) 40 MG tablet TAKE 1 TABLET BY MOUTH EVERY DAY 05/18/23   Renne Homans, MD  Vitamin D , Ergocalciferol , (DRISDOL ) 1.25 MG (50000 UNIT) CAPS capsule Take 50,000 Units by mouth every 7 (seven) days. 08/21/23   [provider]  WEGOVY 0.25 MG/0.5ML SOAJ Inject 0.25 mg into the skin once a week. 08/24/23   [provider]  zinc gluconate 50 MG tablet Take 50 mg by mouth daily.    [provider]  hydrochlorothiazide  (MICROZIDE ) 12.5 MG capsule Take 1 capsule (12.5  mg total) by mouth daily. Patient not taking: Reported on 04/20/2020 09/05/19 04/23/20  Newlin, Enobong, MD    Allergies: Metrogel  [metronidazole ]    Review of Systems  Constitutional:  Negative for fever.  Respiratory:  Negative for shortness of breath.   Cardiovascular:  Negative for chest pain.  Genitourinary:  Positive for dysuria and frequency. Negative for flank pain.  All other systems reviewed and are negative.   Updated Vital Signs BP 128/67 (BP Location: Right Arm)   Pulse 78   Temp 97.7 F (36.5 C) (Oral)   Resp 17   Ht 1.803 m (5' 11)   Wt 110.2 kg   LMP 01/04/2024 (Exact Date)   SpO2 (!) 9%   BMI 33.89 kg/m   Physical Exam Vitals and nursing note reviewed.  Constitutional:      Appearance: She is well-developed. She is obese. She is not ill-appearing.  HENT:     Head: Normocephalic and atraumatic.  Eyes:     Pupils: Pupils are equal, round, and reactive to light.  Cardiovascular:     Rate and Rhythm: Normal rate and regular rhythm.  Pulmonary:     Effort: Pulmonary effort is normal. No respiratory distress.  Abdominal:     Palpations: Abdomen is soft.     Tenderness: There is no right CVA tenderness or left CVA tenderness.     Comments: Mild suprapubic tenderness to palpation, no rebound or guard  Musculoskeletal:  Cervical back: Neck supple.  Skin:    General: Skin is warm and dry.  Neurological:     Mental Status: She is alert and oriented to person, place, and time.  Psychiatric:        Mood and Affect: Mood normal.     (all labs ordered are listed, but only abnormal results are displayed) Labs Reviewed  URINALYSIS, ROUTINE W REFLEX MICROSCOPIC - Abnormal; Notable for the following components:      Result Value   Color, Urine COLORLESS (*)    Leukocytes,Ua SMALL (*)    Bacteria, UA RARE (*)    All other components within normal limits  URINE CULTURE  PREGNANCY, URINE    EKG: None  Radiology: No results found.   Procedures    Medications Ordered in the ED  cephALEXin  (KEFLEX ) capsule 500 mg (500 mg Oral Given 01/18/24 0601)                                    Medical Decision Making Amount and/or Complexity of Data Reviewed Labs: ordered.  Risk Prescription drug management.   This patient presents to the ED for concern of dysuria, this involves an extensive number of treatment options, and is a complaint that carries with it a high risk of complications and morbidity.  I considered the following differential and admission for this acute, potentially life threatening condition.  The differential diagnosis includes cystitis, pyelonephritis, kidney stones  MDM:    This is a 43 year old female who presents with dysuria and frequency.  Nontoxic and vital signs are reassuring.  She is afebrile.  No CVA tenderness on exam.  Urinalysis does show leukocyte esterase, 11-20 white cells and rare bacteria.  Will culture.  Given symptoms, will treat with Keflex .  She does not appear systemically ill appearing and her physical exam is benign.  Do not feel she needs advanced imaging or further workup.  Lower suspicion for pyelonephritis.  (Labs, imaging, consults)  Labs: I Ordered, and personally interpreted labs.  The pertinent results include: Urinalysis, urine pregnancy  Imaging Studies ordered: I ordered imaging studies including none I independently visualized and interpreted imaging. I agree with the radiologist interpretation  Additional history obtained from chart review.  External records from outside source obtained and reviewed including prior evaluations  Cardiac Monitoring: The patient was not maintained on a cardiac monitor.  If on the cardiac monitor, I personally viewed and interpreted the cardiac monitored which showed an underlying rhythm of: N/A  Reevaluation: After the interventions noted above, I reevaluated the patient and found that they have :stayed the same  Social Determinants of  Health:  lives independently  Disposition: Discharge  Co morbidities that complicate the patient evaluation  Past Medical History:  Diagnosis Date   Anxiety    Depression    GERD (gastroesophageal reflux disease)    History of gout 2019  approx   History of kidney stones 2022   Led to urosepsis at [facility]   History of septic shock 04/20/2020   hospital admission in epic;  acute pyenephritis due to uropathy obstruction from ureter calculus   Hypertension    followed by pcp   Hypokalemia 04/21/2020   Pre-diabetes      Medicines Meds ordered this encounter  Medications   cephALEXin  (KEFLEX ) capsule 500 mg   cephALEXin  (KEFLEX ) 500 MG capsule    Sig: Take 1 capsule (500 mg total) by mouth  3 (three) times daily.    Dispense:  21 capsule    Refill:  0    I have reviewed the patients home medicines and have made adjustments as needed  Problem List / ED Course: Problem List Items Addressed This Visit   None Visit Diagnoses       Acute cystitis without hematuria    -  Primary                Final diagnoses:  Acute cystitis without hematuria    ED Discharge Orders          Ordered    cephALEXin  (KEFLEX ) 500 MG capsule  3 times daily        01/18/24 0614               Bari Charmaine FALCON, MD 01/18/24 [phone]

## 2024-01-20 LAB — URINE CULTURE: Culture: 40000 — AB

## 2024-01-21 ENCOUNTER — Telehealth (HOSPITAL_BASED_OUTPATIENT_CLINIC_OR_DEPARTMENT_OTHER): Payer: Self-pay

## 2024-01-21 NOTE — Telephone Encounter (Signed)
 Post ED Visit - Positive Culture Follow-up  Culture report reviewed by antimicrobial stewardship pharmacist: [facility] [x]  Maurilio Patten, Pharm.D. []  Venetia Gully, Pharm.D., BCPS AQ-ID []  Garrel Crews, Pharm.D., BCPS []  Almarie Lunger, Pharm.D., BCPS []  [place], [place].D., BCPS, AAHIVP []  Rosaline Bihari, Pharm.D., BCPS, AAHIVP []  Vernell Meier, PharmD, BCPS []  Latanya Hint, PharmD, BCPS []  Donald Medley, PharmD, BCPS []  Rocky Bold, PharmD []  Dorothyann Alert, PharmD, BCPS []  Morene Babe, PharmD  [facility] []  Rosaline Edison, PharmD []  Romona Bliss, PharmD []  Dolphus Roller, PharmD []  Veva Seip, Rph []  Vernell Daunt) Leonce, PharmD []  Eva Allis, PharmD []  Rosaline Millet, PharmD []  Iantha Batch, PharmD []  Arvin Gauss, PharmD []  Wanda Hasting, PharmD []  Ronal Rav, PharmD []  Rocky Slade, PharmD []  Bard Jeans, PharmD   Positive urine culture Treated with Cephalexin , organism sensitive to the same and no further patient follow-up is required at this time.  Ruth Camelia Elbe 01/21/2024, 10:17 AM

## 2024-04-15 ENCOUNTER — Telehealth: Payer: Self-pay | Admitting: Primary Care

## 2024-04-15 NOTE — Telephone Encounter (Signed)
 Error

## 2024-04-15 NOTE — Telephone Encounter (Signed)
 Called pt to remind them about appt. Pt did not answer and could not LVM was left. Please advise

## 2024-04-18 ENCOUNTER — Telehealth: Payer: Self-pay

## 2024-04-18 ENCOUNTER — Encounter: Payer: Self-pay | Admitting: Primary Care

## 2024-04-18 NOTE — Telephone Encounter (Signed)
 Contacted pt to cancel and reschedule appt due to the weather pt didn't answer lvm  If pt calls back please reschedule   Sent MyChart message as well

## 2024-04-20 ENCOUNTER — Ambulatory Visit (INDEPENDENT_AMBULATORY_CARE_PROVIDER_SITE_OTHER): Payer: Self-pay | Admitting: Primary Care

## 2024-04-20 ENCOUNTER — Encounter (INDEPENDENT_AMBULATORY_CARE_PROVIDER_SITE_OTHER): Payer: Self-pay | Admitting: Primary Care

## 2024-04-20 VITALS — BP 131/84 | HR 66 | Temp 97.6°F | Resp 16 | Ht 71.0 in | Wt 237.0 lb

## 2024-04-20 DIAGNOSIS — E559 Vitamin D deficiency, unspecified: Secondary | ICD-10-CM | POA: Diagnosis not present

## 2024-04-20 DIAGNOSIS — I10 Essential (primary) hypertension: Secondary | ICD-10-CM | POA: Diagnosis not present

## 2024-04-20 DIAGNOSIS — Z7689 Persons encountering health services in other specified circumstances: Secondary | ICD-10-CM

## 2024-04-20 DIAGNOSIS — Z6833 Body mass index (BMI) 33.0-33.9, adult: Secondary | ICD-10-CM

## 2024-04-20 DIAGNOSIS — D649 Anemia, unspecified: Secondary | ICD-10-CM | POA: Diagnosis not present

## 2024-04-20 DIAGNOSIS — Z760 Encounter for issue of repeat prescription: Secondary | ICD-10-CM

## 2024-04-20 DIAGNOSIS — E669 Obesity, unspecified: Secondary | ICD-10-CM

## 2024-04-20 MED ORDER — IRBESARTAN 150 MG PO TABS: 150.0000 mg | ORAL_TABLET | Freq: Every day | ORAL | 1 refills | Status: AC

## 2024-04-20 MED ORDER — AMLODIPINE BESYLATE 10 MG PO TABS: 10.0000 mg | ORAL_TABLET | Freq: Every day | ORAL | 1 refills | Status: AC

## 2024-04-20 NOTE — Progress Notes (Signed)
 "  New Patient Office Visit  Subjective    Patient ID: Melissa Cooley female  DOB: Apr 21, 1980  Age: 44 y.o. MRN: [id number]   CC:  Establish care   HPI  Melissa Cooley is a 44 year old obese female in today to establish care.  She has not seen a provider in several months due to insurance lapse and out of medication.  Requesting blood pressure medication to be refilled done.  She is also grieving over her futures sister-in-law that had a baby 2 months ago and died of a PE and her funeral is this Sunday.  No other concerns or complaints.  Medications Ordered Prior to Encounter[1]   Allergies[2]  Past Medical History:  Diagnosis Date   Anxiety    Depression    GERD (gastroesophageal reflux disease)    History of gout 2019  approx   History of kidney stones 2022   Led to [facility]rosepsis at [facility]   History of septic shock 04/20/2020   hospital admission in epic;  acute pyenephritis due to uropathy obstruction from ureter calculus   Hypertension    followed by pcp   Hypokalemia 04/21/2020   Pre-diabetes      Past Surgical History:  Procedure Laterality Date   CESAREAN SECTION  x2   last one 2011   CYSTOSCOPY WITH RETROGRADE PYELOGRAM, URETEROSCOPY AND STENT PLACEMENT Left 04/20/2020   Procedure: CYSTOSCOPY WITH Left JJ STENT PLACEMENT;  Surgeon: Rosalind Zachary NOVAK, MD;  Location: [facility];  Service: Urology;  Laterality: Left;   CYSTOSCOPY WITH RETROGRADE PYELOGRAM, URETEROSCOPY AND STENT PLACEMENT Left 05/08/2020   Procedure: CYSTOSCOPY WITH RETROGRADE PYELOGRAM, URETEROSCOPY WITH LASER , AND STENT EXCHANGE;  Surgeon: Rosalind Zachary NOVAK, MD;  Location: [facility];  Service: Urology;  Laterality: Left;   HOLMIUM LASER APPLICATION Left 05/08/2020   Procedure: HOLMIUM LASER APPLICATION;  Surgeon: Rosalind Zachary NOVAK, MD;  Location: [facility];  Service: Urology;  Laterality: Left;   LAPAROSCOPIC CHOLECYSTECTOMY  2003   TUBAL LIGATION Bilateral 2017     Family History   Problem Relation Age of Onset   Cancer Mother    Drug abuse Mother    Bipolar disorder Mother    Breast cancer Mother 44   Diabetes Father    Hypertension Other    Anxiety disorder Sister    Drug abuse Brother    Lupus Maternal Aunt     Social History   Socioeconomic History   Marital status: Single    Spouse name: Not on file   Number of children: Not on file   Years of education: Not on file   Highest education level: Not on file  Occupational History   Not on file  Tobacco Use   Smoking status: Former    Current packs/day: 0.00    Types: Cigarettes    Start date: 05/03/2000    Quit date: 05/04/2003    Years since quitting: 20.9    Passive exposure: Never   Smokeless tobacco: Never  Vaping Use   Vaping status: Never Used  Substance and Sexual Activity   Alcohol use: Yes    Comment: socially   Drug use: Never   Sexual activity: Yes    Birth control/protection: Surgical  Other Topics Concern   Not on file  Social History Narrative   Not on file   Social Drivers of Health   Tobacco Use: Medium Risk (04/20/2024)   Patient History    Smoking Tobacco Use: Former  Smokeless Tobacco Use: Never    Passive Exposure: Never  Financial Resource Strain: Not on file  Food Insecurity: No Food Insecurity (09/08/2022)   Hunger Vital Sign    Worried About Running Out of Food in the Last Year: Never true    Ran Out of Food in the Last Year: Never true  Transportation Needs: Unmet Transportation Needs (09/08/2022)   PRAPARE - Administrator, Civil Service (Medical): Yes    Lack of Transportation (Non-Medical): Yes  Physical Activity: Not on file  Stress: Not on file  Social Connections: Not on file  Intimate Partner Violence: Not on file  Depression (PHQ2-9): High Risk (02/18/2023)   Depression (PHQ2-9)    PHQ-2 Score: 22  Alcohol Screen: Not on file  Housing: Not on file  Utilities: Not on file  Health Literacy: Not on file   Health Maintenance  Topic  Date Due   Hepatitis B Vaccine (1 of 3 - 19+ 3-dose series) Never done   COVID-19 Vaccine (3 - Pfizer risk series) 08/15/2019   Flu Shot  10/16/2023   Breast Cancer Screening  04/20/2025   Pap with HPV screening  03/29/2028   DTaP/Tdap/Td vaccine (2 - Td or Tdap) 09/04/2029   HPV Vaccine (No Doses Required) Completed   Hepatitis C Screening  Completed   HIV Screening  Completed   Pneumococcal Vaccine  Aged Out   Meningitis B Vaccine  Aged Out    Objective   BP 131/84   Pulse 66   Temp 97.6 F (36.4 C)   Resp 16   Ht 5' 11 (1.803 m)   Wt 237 lb (107.5 kg)   SpO2 100%   BMI 33.05 kg/m    Physical Exam Vitals reviewed.  Constitutional:      Appearance: Normal appearance. She is obese.  HENT:     Head: Normocephalic.     Right Ear: Tympanic membrane, ear canal and external ear normal.     Left Ear: Tympanic membrane, ear canal and external ear normal.     Nose: Nose normal.     Mouth/Throat:     Mouth: Mucous membranes are moist.  Eyes:     Extraocular Movements: Extraocular movements intact.     Pupils: Pupils are equal, round, and reactive to light.  Cardiovascular:     Rate and Rhythm: Normal rate.  Pulmonary:     Effort: Pulmonary effort is normal.     Breath sounds: Normal breath sounds.  Abdominal:     General: Bowel sounds are normal.     Palpations: Abdomen is soft.  Musculoskeletal:        General: Normal range of motion.     Cervical back: Normal range of motion and neck supple.  Skin:    General: Skin is warm and dry.  Neurological:     Mental Status: She is alert and oriented to person, place, and time.  Psychiatric:        Mood and Affect: Mood normal.        Behavior: Behavior normal.        Thought Content: Thought content normal.      Assessment & Plan:  Diagnoses and all orders for this visit:  Encounter to establish care  Vitamin D  deficiency Takes OTC vit D3 daily   Obesity (BMI 30-39.9) Obesity is 30-39 indicating an excess in  caloric intake or underlining conditions. This may lead to other co-morbidities. Educated on lifestyle modifications of diet and exercise which may  reduce obesity.    Essential hypertension BP goal - < 130/80 Explained that having normal blood pressure is the goal and medications are helping to get to goal and maintain normal blood pressure. DIET: Limit salt intake, read nutrition labels to check salt content, limit fried and high fatty foods  Avoid using multisymptom OTC cold preparations that generally contain sudafed which can rise BP. Consult with pharmacist on best cold relief products to use for persons with HTN EXERCISE Discussed incorporating exercise such as walking - 30 minutes most days of the week and can do in 10 minute intervals    -     irbesartan  (AVAPRO ) 150 MG tablet; Take 1 tablet (150 mg total) by mouth daily. -     amLODipine  (NORVASC ) 10 MG tablet; Take 1 tablet (10 mg total) by mouth daily.  Normocytic anemia Labs future -CBC   Medication refill -     irbesartan  (AVAPRO ) 150 MG tablet; Take 1 tablet (150 mg total) by mouth daily. -     amLODipine  (NORVASC ) 10 MG tablet; Take 1 tablet (10 mg total) by mouth daily.     Follow-up:  Return in about 6 months (around 10/18/2024) for re-check blood pressure, fasting labs.  The above assessment and management plan was discussed with the patient. The patient verbalized understanding of and has agreed to the management plan. Patient is aware to call the clinic if symptoms fail to improve or worsen. Patient is aware when to return to the clinic for a follow-up visit. Patient educated on when it is appropriate to go to the emergency department.   Rosaline Bohr, NP-C    [1]  Current Outpatient Medications on File Prior to Visit  Medication Sig Dispense Refill   ascorbic acid (VITAMIN C) 500 MG tablet Take 500 mg by mouth daily.     Multiple Vitamins-Minerals (WOMENS MULTIVITAMIN PO) Take 1 tablet by mouth daily.      pantoprazole  (PROTONIX ) 40 MG tablet TAKE 1 TABLET BY MOUTH EVERY DAY 90 tablet 2   Vitamin D , Ergocalciferol , (DRISDOL ) 1.25 MG (50000 UNIT) CAPS capsule Take 50,000 Units by mouth every 7 (seven) days.     WEGOVY 0.25 MG/0.5ML SOAJ Inject 0.25 mg into the skin once a week.     zinc gluconate 50 MG tablet Take 50 mg by mouth daily.     [DISCONTINUED] hydrochlorothiazide  (MICROZIDE ) 12.5 MG capsule Take 1 capsule (12.5 mg total) by mouth daily. (Patient not taking: Reported on 04/20/2020) 90 capsule 1   No current facility-administered medications on file prior to visit.  [2]  Allergies Allergen Reactions   Metrogel  [Metronidazole ]     Increased vaginal irritation. Patient reports she can take Metronidazole  tablet.   "
